# Patient Record
Sex: Male | Born: 1945 | Race: Black or African American | Hispanic: No | Marital: Married | Smoking: Current every day smoker
Health system: Southern US, Community
[De-identification: ages and names within clinical notes are randomized; demographics above are authoritative.]

## PROBLEM LIST (undated history)

## (undated) DIAGNOSIS — M1990 Unspecified osteoarthritis, unspecified site: Secondary | ICD-10-CM

## (undated) DIAGNOSIS — Z720 Tobacco use: Secondary | ICD-10-CM

## (undated) DIAGNOSIS — M109 Gout, unspecified: Secondary | ICD-10-CM

## (undated) DIAGNOSIS — C069 Malignant neoplasm of mouth, unspecified: Secondary | ICD-10-CM

## (undated) DIAGNOSIS — I10 Essential (primary) hypertension: Secondary | ICD-10-CM

## (undated) DIAGNOSIS — I5022 Chronic systolic (congestive) heart failure: Secondary | ICD-10-CM

---
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
## 2000-06-01 ENCOUNTER — Encounter: Payer: Self-pay | Admitting: Emergency Medicine

## 2000-06-01 ENCOUNTER — Inpatient Hospital Stay (HOSPITAL_COMMUNITY): Admission: EM | Admit: 2000-06-01 | Discharge: 2000-06-02 | Payer: Self-pay | Admitting: Emergency Medicine

## 2000-06-02 ENCOUNTER — Encounter: Payer: Self-pay | Admitting: Internal Medicine

## 2000-06-02 ENCOUNTER — Encounter: Payer: Self-pay | Admitting: *Deleted

## 2004-04-04 ENCOUNTER — Encounter: Admission: RE | Admit: 2004-04-04 | Discharge: 2004-04-04 | Payer: Self-pay | Admitting: Allergy and Immunology

## 2014-03-05 HISTORY — PX: FLOOR OF MOUTH BIOPSY: SHX5834

## 2014-03-13 ENCOUNTER — Other Ambulatory Visit (HOSPITAL_COMMUNITY): Payer: Self-pay | Admitting: Otolaryngology

## 2014-03-13 DIAGNOSIS — K1379 Other lesions of oral mucosa: Secondary | ICD-10-CM

## 2014-03-14 ENCOUNTER — Ambulatory Visit (HOSPITAL_COMMUNITY)
Admission: RE | Admit: 2014-03-14 | Discharge: 2014-03-14 | Disposition: A | Discharge status: Home / Self Care | Payer: Medicare Other | Source: Ambulatory Visit | Attending: Otolaryngology | Admitting: Otolaryngology

## 2014-03-14 DIAGNOSIS — R221 Localized swelling, mass and lump, neck: Secondary | ICD-10-CM

## 2014-03-14 DIAGNOSIS — K1379 Other lesions of oral mucosa: Secondary | ICD-10-CM

## 2014-03-14 DIAGNOSIS — K146 Glossodynia: Secondary | ICD-10-CM | POA: Insufficient documentation

## 2014-03-14 DIAGNOSIS — K08109 Complete loss of teeth, unspecified cause, unspecified class: Secondary | ICD-10-CM | POA: Insufficient documentation

## 2014-03-14 DIAGNOSIS — R220 Localized swelling, mass and lump, head: Secondary | ICD-10-CM | POA: Insufficient documentation

## 2014-03-14 DIAGNOSIS — K08409 Partial loss of teeth, unspecified cause, unspecified class: Secondary | ICD-10-CM | POA: Insufficient documentation

## 2014-03-14 LAB — POCT I-STAT CREATININE: Creatinine, Ser: 1.1 mg/dL (ref 0.50–1.35)

## 2014-03-14 MED ORDER — IOHEXOL 300 MG/ML  SOLN
75.0000 mL | Freq: Once | INTRAMUSCULAR | Status: AC | PRN
  Administered 2014-03-14: 75 mL via INTRAVENOUS

## 2014-03-23 ENCOUNTER — Encounter (HOSPITAL_COMMUNITY): Payer: Self-pay | Admitting: Emergency Medicine

## 2014-03-23 ENCOUNTER — Emergency Department (HOSPITAL_COMMUNITY): Payer: Medicare Other

## 2014-03-23 ENCOUNTER — Inpatient Hospital Stay (HOSPITAL_COMMUNITY)
Admission: EM | Admit: 2014-03-23 | Discharge: 2014-03-28 | DRG: 286 | Disposition: A | Discharge status: Home / Self Care | Payer: Medicare Other | Attending: Internal Medicine | Admitting: Internal Medicine

## 2014-03-23 ENCOUNTER — Other Ambulatory Visit (HOSPITAL_COMMUNITY): Payer: Self-pay | Admitting: Otolaryngology

## 2014-03-23 DIAGNOSIS — I5021 Acute systolic (congestive) heart failure: Principal | ICD-10-CM | POA: Diagnosis present

## 2014-03-23 DIAGNOSIS — L988 Other specified disorders of the skin and subcutaneous tissue: Secondary | ICD-10-CM | POA: Diagnosis present

## 2014-03-23 DIAGNOSIS — J441 Chronic obstructive pulmonary disease with (acute) exacerbation: Secondary | ICD-10-CM | POA: Diagnosis present

## 2014-03-23 DIAGNOSIS — I472 Ventricular tachycardia, unspecified: Secondary | ICD-10-CM | POA: Diagnosis present

## 2014-03-23 DIAGNOSIS — F1010 Alcohol abuse, uncomplicated: Secondary | ICD-10-CM | POA: Diagnosis present

## 2014-03-23 DIAGNOSIS — R3129 Other microscopic hematuria: Secondary | ICD-10-CM | POA: Diagnosis not present

## 2014-03-23 DIAGNOSIS — C7800 Secondary malignant neoplasm of unspecified lung: Secondary | ICD-10-CM | POA: Diagnosis present

## 2014-03-23 DIAGNOSIS — R931 Abnormal findings on diagnostic imaging of heart and coronary circulation: Secondary | ICD-10-CM

## 2014-03-23 DIAGNOSIS — D7389 Other diseases of spleen: Secondary | ICD-10-CM | POA: Diagnosis present

## 2014-03-23 DIAGNOSIS — J90 Pleural effusion, not elsewhere classified: Secondary | ICD-10-CM

## 2014-03-23 DIAGNOSIS — Z79899 Other long term (current) drug therapy: Secondary | ICD-10-CM

## 2014-03-23 DIAGNOSIS — E875 Hyperkalemia: Secondary | ICD-10-CM | POA: Diagnosis present

## 2014-03-23 DIAGNOSIS — C029 Malignant neoplasm of tongue, unspecified: Secondary | ICD-10-CM | POA: Diagnosis present

## 2014-03-23 DIAGNOSIS — R918 Other nonspecific abnormal finding of lung field: Secondary | ICD-10-CM | POA: Diagnosis present

## 2014-03-23 DIAGNOSIS — C049 Malignant neoplasm of floor of mouth, unspecified: Secondary | ICD-10-CM

## 2014-03-23 DIAGNOSIS — R0609 Other forms of dyspnea: Secondary | ICD-10-CM

## 2014-03-23 DIAGNOSIS — E876 Hypokalemia: Secondary | ICD-10-CM | POA: Diagnosis present

## 2014-03-23 DIAGNOSIS — I2510 Atherosclerotic heart disease of native coronary artery without angina pectoris: Secondary | ICD-10-CM | POA: Diagnosis present

## 2014-03-23 DIAGNOSIS — R911 Solitary pulmonary nodule: Secondary | ICD-10-CM

## 2014-03-23 DIAGNOSIS — R0602 Shortness of breath: Secondary | ICD-10-CM | POA: Diagnosis present

## 2014-03-23 DIAGNOSIS — N182 Chronic kidney disease, stage 2 (mild): Secondary | ICD-10-CM | POA: Diagnosis present

## 2014-03-23 DIAGNOSIS — J189 Pneumonia, unspecified organism: Secondary | ICD-10-CM | POA: Diagnosis present

## 2014-03-23 DIAGNOSIS — R9431 Abnormal electrocardiogram [ECG] [EKG]: Secondary | ICD-10-CM

## 2014-03-23 DIAGNOSIS — I509 Heart failure, unspecified: Secondary | ICD-10-CM | POA: Diagnosis present

## 2014-03-23 DIAGNOSIS — E871 Hypo-osmolality and hyponatremia: Secondary | ICD-10-CM | POA: Diagnosis present

## 2014-03-23 DIAGNOSIS — E43 Unspecified severe protein-calorie malnutrition: Secondary | ICD-10-CM | POA: Diagnosis present

## 2014-03-23 DIAGNOSIS — Z87891 Personal history of nicotine dependence: Secondary | ICD-10-CM

## 2014-03-23 DIAGNOSIS — R7301 Impaired fasting glucose: Secondary | ICD-10-CM | POA: Diagnosis present

## 2014-03-23 DIAGNOSIS — F17200 Nicotine dependence, unspecified, uncomplicated: Secondary | ICD-10-CM | POA: Diagnosis present

## 2014-03-23 DIAGNOSIS — I129 Hypertensive chronic kidney disease with stage 1 through stage 4 chronic kidney disease, or unspecified chronic kidney disease: Secondary | ICD-10-CM | POA: Diagnosis present

## 2014-03-23 DIAGNOSIS — I428 Other cardiomyopathies: Secondary | ICD-10-CM

## 2014-03-23 DIAGNOSIS — I4729 Other ventricular tachycardia: Secondary | ICD-10-CM | POA: Diagnosis present

## 2014-03-23 HISTORY — DX: Unspecified osteoarthritis, unspecified site: M19.90

## 2014-03-23 HISTORY — DX: Gout, unspecified: M10.9

## 2014-03-23 HISTORY — DX: Malignant neoplasm of mouth, unspecified: C06.9

## 2014-03-23 HISTORY — DX: Essential (primary) hypertension: I10

## 2014-03-23 HISTORY — DX: Tobacco use: Z72.0

## 2014-03-23 HISTORY — DX: Chronic systolic (congestive) heart failure: I50.22

## 2014-03-23 LAB — COMPREHENSIVE METABOLIC PANEL
ALBUMIN: 2.8 g/dL — AB (ref 3.5–5.2)
ALK PHOS: 124 U/L — AB (ref 39–117)
ALT: 21 U/L (ref 0–53)
AST: 33 U/L (ref 0–37)
BUN: 16 mg/dL (ref 6–23)
CHLORIDE: 93 meq/L — AB (ref 96–112)
CO2: 25 mEq/L (ref 19–32)
Calcium: 7.1 mg/dL — ABNORMAL LOW (ref 8.4–10.5)
Creatinine, Ser: 1.01 mg/dL (ref 0.50–1.35)
GFR calc Af Amer: 87 mL/min — ABNORMAL LOW (ref >90)
GFR calc non Af Amer: 75 mL/min — ABNORMAL LOW (ref >90)
Glucose, Bld: 161 mg/dL — ABNORMAL HIGH (ref 70–99)
POTASSIUM: 3 meq/L — AB (ref 3.7–5.3)
Sodium: 137 mEq/L (ref 137–147)
Total Bilirubin: 0.5 mg/dL (ref 0.3–1.2)
Total Protein: 7.3 g/dL (ref 6.0–8.3)

## 2014-03-23 LAB — BASIC METABOLIC PANEL
BUN: 14 mg/dL (ref 6–23)
CHLORIDE: 94 meq/L — AB (ref 96–112)
CO2: 26 mEq/L (ref 19–32)
Calcium: 7.1 mg/dL — ABNORMAL LOW (ref 8.4–10.5)
Creatinine, Ser: 0.89 mg/dL (ref 0.50–1.35)
GFR, EST NON AFRICAN AMERICAN: 87 mL/min — AB (ref >90)
Glucose, Bld: 121 mg/dL — ABNORMAL HIGH (ref 70–99)
POTASSIUM: 2.3 meq/L — AB (ref 3.7–5.3)
SODIUM: 140 meq/L (ref 137–147)

## 2014-03-23 LAB — CBC
HCT: 30.9 % — ABNORMAL LOW (ref 39.0–52.0)
HEMOGLOBIN: 10.6 g/dL — AB (ref 13.0–17.0)
MCH: 30.2 pg (ref 26.0–34.0)
MCHC: 34.3 g/dL (ref 30.0–36.0)
MCV: 88 fL (ref 78.0–100.0)
Platelets: 313 10*3/uL (ref 150–400)
RBC: 3.51 MIL/uL — ABNORMAL LOW (ref 4.22–5.81)
RDW: 14.3 % (ref 11.5–15.5)
WBC: 5.8 10*3/uL (ref 4.0–10.5)

## 2014-03-23 LAB — I-STAT TROPONIN, ED: TROPONIN I, POC: 0 ng/mL (ref 0.00–0.08)

## 2014-03-23 LAB — MAGNESIUM: Magnesium: 1 mg/dL — ABNORMAL LOW (ref 1.5–2.5)

## 2014-03-23 LAB — PRO B NATRIURETIC PEPTIDE: PRO B NATRI PEPTIDE: 6297 pg/mL — AB (ref 0–125)

## 2014-03-23 MED ORDER — ADULT MULTIVITAMIN W/MINERALS CH
1.0000 | ORAL_TABLET | Freq: Every day | ORAL | Status: DC
  Administered 2014-03-23 – 2014-03-27 (×5): 1 via ORAL
  Filled 2014-03-23 (×7): qty 1

## 2014-03-23 MED ORDER — ACETAMINOPHEN 650 MG RE SUPP: 650.0000 mg | Freq: Four times a day (QID) | RECTAL | Status: DC | PRN

## 2014-03-23 MED ORDER — SODIUM CHLORIDE 0.9 % IJ SOLN
3.0000 mL | Freq: Two times a day (BID) | INTRAMUSCULAR | Status: DC
  Administered 2014-03-23 – 2014-03-26 (×6): 3 mL via INTRAVENOUS

## 2014-03-23 MED ORDER — IPRATROPIUM BROMIDE 0.02 % IN SOLN: 0.5000 mg | Freq: Four times a day (QID) | RESPIRATORY_TRACT | Status: DC

## 2014-03-23 MED ORDER — FOLIC ACID 1 MG PO TABS
1.0000 mg | ORAL_TABLET | Freq: Every day | ORAL | Status: DC
Start: 2014-03-23 — End: 2014-03-28
  Administered 2014-03-23 – 2014-03-27 (×5): 1 mg via ORAL
  Filled 2014-03-23 (×7): qty 1

## 2014-03-23 MED ORDER — ACETAMINOPHEN 325 MG PO TABS
650.0000 mg | ORAL_TABLET | Freq: Four times a day (QID) | ORAL | Status: DC | PRN
Start: 2014-03-23 — End: 2014-03-28

## 2014-03-23 MED ORDER — ALBUTEROL SULFATE (2.5 MG/3ML) 0.083% IN NEBU: 2.5000 mg | INHALATION_SOLUTION | Freq: Four times a day (QID) | RESPIRATORY_TRACT | Status: DC

## 2014-03-23 MED ORDER — POTASSIUM CHLORIDE CRYS ER 20 MEQ PO TBCR
40.0000 meq | EXTENDED_RELEASE_TABLET | ORAL | Status: AC
  Administered 2014-03-23 (×2): 40 meq via ORAL
  Filled 2014-03-23 (×2): qty 2

## 2014-03-23 MED ORDER — LORAZEPAM 2 MG/ML IJ SOLN: 1.0000 mg | Freq: Four times a day (QID) | INTRAMUSCULAR | Status: AC | PRN

## 2014-03-23 MED ORDER — POTASSIUM CHLORIDE 10 MEQ/100ML IV SOLN
10.0000 meq | INTRAVENOUS | Status: AC
  Administered 2014-03-23: 10 meq via INTRAVENOUS
  Filled 2014-03-23: qty 100

## 2014-03-23 MED ORDER — IPRATROPIUM-ALBUTEROL 0.5-2.5 (3) MG/3ML IN SOLN
3.0000 mL | Freq: Four times a day (QID) | RESPIRATORY_TRACT | Status: DC
  Administered 2014-03-23: 3 mL via RESPIRATORY_TRACT
  Filled 2014-03-23: qty 3

## 2014-03-23 MED ORDER — ALBUTEROL SULFATE (2.5 MG/3ML) 0.083% IN NEBU
2.5000 mg | INHALATION_SOLUTION | RESPIRATORY_TRACT | Status: DC | PRN
  Administered 2014-03-26 – 2014-03-27 (×2): 2.5 mg via RESPIRATORY_TRACT
  Filled 2014-03-23 (×3): qty 3

## 2014-03-23 MED ORDER — LORAZEPAM 1 MG PO TABS
1.0000 mg | ORAL_TABLET | Freq: Four times a day (QID) | ORAL | Status: AC | PRN
  Administered 2014-03-26: 1 mg via ORAL
  Filled 2014-03-23: qty 1

## 2014-03-23 MED ORDER — DEXTROSE 5 % IV SOLN
2.0000 g | Freq: Once | INTRAVENOUS | Status: AC
  Administered 2014-03-23: 2 g via INTRAVENOUS
  Filled 2014-03-23: qty 2

## 2014-03-23 MED ORDER — POTASSIUM CHLORIDE CRYS ER 20 MEQ PO TBCR
40.0000 meq | EXTENDED_RELEASE_TABLET | Freq: Once | ORAL | Status: AC
  Administered 2014-03-23: 40 meq via ORAL
  Filled 2014-03-23: qty 2

## 2014-03-23 MED ORDER — POTASSIUM CHLORIDE 10 MEQ/100ML IV SOLN
10.0000 meq | Freq: Once | INTRAVENOUS | Status: DC
Start: 2014-03-23 — End: 2014-03-23

## 2014-03-23 MED ORDER — IOHEXOL 350 MG/ML SOLN
100.0000 mL | Freq: Once | INTRAVENOUS | Status: AC | PRN
  Administered 2014-03-23: 100 mL via INTRAVENOUS

## 2014-03-23 MED ORDER — MAGNESIUM SULFATE 4000MG/100ML IJ SOLN
4.0000 g | Freq: Once | INTRAMUSCULAR | Status: AC
  Administered 2014-03-23: 4 g via INTRAVENOUS
  Filled 2014-03-23: qty 100

## 2014-03-23 MED ORDER — ENOXAPARIN SODIUM 40 MG/0.4ML ~~LOC~~ SOLN
40.0000 mg | SUBCUTANEOUS | Status: DC
  Administered 2014-03-23 – 2014-03-26 (×4): 40 mg via SUBCUTANEOUS
  Filled 2014-03-23 (×6): qty 0.4

## 2014-03-23 MED ORDER — DEXTROSE 5 % IV SOLN
500.0000 mg | Freq: Once | INTRAVENOUS | Status: AC
  Administered 2014-03-23: 500 mg via INTRAVENOUS
  Filled 2014-03-23 (×2): qty 500

## 2014-03-23 MED ORDER — VITAMIN B-1 100 MG PO TABS
100.0000 mg | ORAL_TABLET | Freq: Every day | ORAL | Status: DC
  Administered 2014-03-23 – 2014-03-27 (×5): 100 mg via ORAL
  Filled 2014-03-23 (×7): qty 1

## 2014-03-23 NOTE — ED Notes (Signed)
Patient here for sob, onset 2 months ago, was here for same, pt with hx of mouth cancer, denies pain at this time.

## 2014-03-23 NOTE — ED Notes (Signed)
Sob  Started on Sunday dx w/ cancer lung 1 month ago

## 2014-03-23 NOTE — ED Notes (Signed)
Critical Potassium 2.3 reported to [employer]

## 2014-03-23 NOTE — ED Provider Notes (Signed)
CSN: [id number]     Arrival date & time 03/23/14  1021 History   First MD Initiated Contact with Patient 03/23/14 1035     Chief Complaint  Patient presents with  . Shortness of Breath     (Consider location/radiation/quality/duration/timing/severity/associated sxs/prior Treatment) HPI 68 year old male presents with one week of shortness of breath. Shortness of breath is on exertion. When he rests he feels better and his difficulty breathing is gone. Has had a cough, mostly nonproductive. No chest pain. He was recently diagnosed with throat cancer and has been referred to the [facility] in [place]. He denies any fevers. He's had some mild leg swelling for about one month bilaterally. Denies any orthopnea or paroxysmal nocturnal dyspnea. No history of COPD. He is a former smoker. He's been hearing some wheezing intermittently.  Past Medical History  Diagnosis Date  . Cancer    History reviewed. No pertinent past surgical history. No family history on file. History  Substance Use Topics  . Smoking status: Former Research scientist (life sciences)  . Smokeless tobacco: Not on file  . Alcohol Use: Not on file    Review of Systems  Constitutional: Negative for fever.  Respiratory: Positive for cough and shortness of breath. Negative for wheezing.   Cardiovascular: Positive for leg swelling. Negative for chest pain.  Gastrointestinal: Negative for vomiting and abdominal pain.  All other systems reviewed and are negative.     Allergies  Review of patient's allergies indicates no known allergies.  Home Medications   Prior to Admission medications   Not on File   BP 144/89  Pulse 101  Resp 17  Wt 155 lb (70.308 kg)  SpO2 92% Physical Exam  Nursing note and vitals reviewed. Constitutional: He is oriented to person, place, and time. He appears well-developed and well-nourished.  HENT:  Head: Normocephalic and atraumatic.  Right Ear: External ear normal.  Left Ear: External ear normal.  Nose:  Nose normal.  Eyes: Right eye exhibits no discharge. Left eye exhibits no discharge.  Neck: Neck supple.  Cardiovascular: Normal rate, regular rhythm, normal heart sounds and intact distal pulses.   HR around 100  Pulmonary/Chest: Effort normal. He has no wheezes. He has rales in the right lower field and the left lower field.  Abdominal: Soft. He exhibits no distension. There is no tenderness.  Musculoskeletal: He exhibits edema (trace pedal edema bilaterally).  Neurological: He is alert and oriented to person, place, and time.  Skin: Skin is warm and dry.    ED Course  Procedures (including critical care time) Labs Review Labs Reviewed  BASIC METABOLIC PANEL - Abnormal; Notable for the following:    Potassium 2.3 (*)    Chloride 94 (*)    Glucose, Bld 121 (*)    Calcium 7.1 (*)    GFR calc non Af Amer 87 (*)    All other components within normal limits  CBC - Abnormal; Notable for the following:    RBC 3.51 (*)    Hemoglobin 10.6 (*)    HCT 30.9 (*)    All other components within normal limits  PRO B NATRIURETIC PEPTIDE - Abnormal; Notable for the following:    Pro B Natriuretic peptide (BNP) 6297.0 (*)    All other components within normal limits  CULTURE, BLOOD (ROUTINE X 2)  CULTURE, BLOOD (ROUTINE X 2)  I-STAT TROPOININ, ED    Imaging Review Dg Chest 2 View (if Patient Has Fever And/or Copd)  03/23/2014   CLINICAL DATA:  Shortness  of breath  EXAM: CHEST  2 VIEW  COMPARISON:  PA and lateral chest x-ray of April 04, 2004.  FINDINGS: There are confluent masslike parenchymal densities in the right and left mid lung. The cardiac silhouette is normal in size. The pulmonary vascularity is not engorged. Small amounts of pleural fluid blunt the costophrenic angles laterally and thickened the fissures. The bony thorax is unremarkable.  IMPRESSION: Abnormal bilateral pulmonary parenchymal densities are suspicious for masses which may be infectious or neoplastic. Chest CT scanning now  is recommended.   Electronically Signed   By: David  Martinique   On: 03/23/2014 10:53   Ct Angio Chest W/cm &/or Wo Cm  03/23/2014   CLINICAL DATA:  Shortness of breath, possible lung cancer - evaluate for PE  EXAM: CT ANGIOGRAPHY CHEST WITH CONTRAST  TECHNIQUE: Multidetector CT imaging of the chest was performed using the standard protocol during bolus administration of intravenous contrast. Multiplanar CT image reconstructions and MIPs were obtained to evaluate the vascular anatomy.  CONTRAST:  181mL OMNIPAQUE IOHEXOL 350 MG/ML SOLN  COMPARISON:  Chest x-ray obtained earlier today  FINDINGS: Mediastinum: Unremarkable CT appearance of the thyroid gland. Borderline mediastinal adenopathy. A prevascular lymph node measures 9 mm in short axis (image 35, series 4). Slightly enlarged right paratracheal node measures 1.3 cm in short axis (image 26, series 4). Unremarkable appearance of the esophagus.  Heart/Vascular: Adequate opacification of the pulmonary arteries to the proximal subsegmental level. No evidence of central filling defect to suggest acute pulmonary embolus. Atherosclerotic calcifications present within the coronary arteries. Borderline cardiomegaly with left ventricular dilatation. Reflux of contrast material into the hepatic veins noted incidentally.  Lungs/Pleura: Small right pleural effusion versus pleural thickening. There is thickening calcified pleural plaque medially adjacent to the right lower lobe as well as a mild pleural calcification inferiorly overlying the hepatic dome. Extensive nodular low-attenuation implants present throughout the subpleural space, particularly within the pulmonary fissures. Exact measurement of the abnormalities is a difficult given that many of the in nodules are confluent and connect through the subpleural space. In the superior aspect of the right major fissure, an index dumbbell-shaped lesion measures 6.1 x 3.6 cm. On the left, in the major fissure a second  dumbbell-shaped lesion measures 5.9 by 3.0 cm (image 58, series 4). Moderate centrilobular emphysema is present throughout the upper lungs. Respiratory motion limits evaluation for small pulmonary nodules. There is a single 1.6 x 1.3 cm nodular opacity in the left lower lobe (image 70, series 6). The margins of this nodule are somewhat hazy and ill-defined. It is unclear if this is secondary to ground-glass attenuation as can be seen in the setting of infection/ inflammation, or due to superimpose respiratory motion. There is diffuse bronchial wall thickening throughout the lungs.  Bones/Soft Tissues: No acute fracture or aggressive appearing lytic or blastic osseous lesion. No acute fracture or aggressive appearing lytic or blastic osseous lesion. Multilevel degenerative spurring throughout the thoracic spine.  Upper Abdomen: Punctate calcifications present throughout the spleen and liver suggesting old granulomatous disease. The images are mildly degraded by motion artifact. A 9 mm low-attenuation lesion in the upper pole of the right kidney is incompletely characterized but statistically likely a benign cyst.  Review of the MIP images confirms the above findings.  IMPRESSION: 1. Negative for acute pulmonary embolus. 2. The numerous densities seen on the prior chest x-ray correspond with subpleural low-attenuation (water) masses scattered throughout the pleural surfaces of the pulmonary fissures indicative of a process. Given the  overall distribution and borderline mediastinal lymphadenopathy, an atypical appearance of sarcoidosis is favored. However, the imaging appearance is not definitive in this example. Additional differential considerations given the very low attenuation of the masses include multi loculated pleural fluid, multiple bilateral pleural metastases from an unknown primary malignancy (possibly a mucinous adenocarcinoma given the low-attenuation), and less likely a primary pleural malignancy such  as mesothelioma. Mesothelioma is considered less likely given the bilaterality of the masses and the unilateral distribution of calcified pleural plaques. 3. There is a solitary somewhat ill-defined and hazy 1.6 x 1.3 cm nodular opacity in the left lower lobe which may represent a focus of active infection/inflammation or a true pulmonary nodule. Overall, I would recommend further evaluation with PET-CT to evaluate for hypermetabolic activity in both the pleural lesions, and the left lower lobe nodule. If all lesions are hypermetabolic, than CT-guided biopsy would be warranted. If the left lower lobe pulmonary nodule is intensely hypermetabolic, CT-guided biopsy would be warranted. If, however the left lower lobe pulmonary nodule is only mildly hypermetabolic then both repeat imaging following an appropriate course of antimicrobial therapy or CT-guided biopsy would be reasonable approaches. 4. Nonspecific mediastinal adenopathy may be reactive / granulomatous or metastatic. 5. 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 6. Calcified pleural plaques in the medial and inferior aspect of the right hemi thorax. Given the unilaterality, this likely represents the sequelae of a prior infectious/inflammatory process or sequelae of remote hemothorax. Asbestos related pleural disease is less likely. 7. Cardiomegaly with left ventricular dilatation. 8. Atherosclerosis including coronary artery disease. 9. Sequelae of old granulomatous disease involving the spleen and liver.   Electronically Signed   By: Jacqulynn Cadet M.D.   On: 03/23/2014 13:21     EKG Interpretation   Date/Time:  Friday March 23 2014 10:26:09 EDT Ventricular Rate:  102 PR Interval:  140 QRS Duration: 100 QT Interval:  406 QTC Calculation: 529 R Axis:   34 Text Interpretation:  Sinus tachycardia Possible Left atrial enlargement  Left ventricular hypertrophy ST \\T \ T wave abnormality, consider lateral  ischemia  Abnormal ECG No old tracing to compare Confirmed by Trezevant  MD,  [place] ([id number]) on 03/23/2014 10:35:48 AM      MDM   Final diagnoses:  Community acquired pneumonia  Dyspnea on exertion  Abnormal EKG  Hypokalemia    Patient's CT scan shows likely sarcoidosis but also an area of mass versus pneumonia. Given his acute symptoms over last week, he'll need treatment for pneumonia. He has EKG changes, but no old EKG to compare to. This concerning for either heart etiology or could be related to his hyperkalemia. He has no PCP at this time, will be admitted to the internal medicine teaching service. Will need echocardiogram given his elevated BNP and minor leg swelling. He is at this time stable, will be admitted on telemetry.    Ephraim Hamburger, MD 03/23/14 1550

## 2014-03-23 NOTE — ED Notes (Signed)
MD at bedside. 

## 2014-03-23 NOTE — Progress Notes (Signed)
Pt states he drinks "3-4 shots" daily, RN paged MD to make aware.

## 2014-03-23 NOTE — Progress Notes (Signed)
NURSING PROGRESS NOTE  [facility] [id number] Admission Data: 03/23/2014 3:39 PM Attending Provider: Madilyn Fireman, MD PCP:No PCP Per Patient Code Status: Full  BOYD BUFFALO is a 68 y.o. male patient admitted from ED:  -No acute distress noted.  -No complaints of shortness of breath.  -No complaints of chest pain.   Cardiac Monitoring: Box # [id number] in place. Cardiac monitor yields:normal sinus rhythm.  Blood pressure 152/88, pulse 108, temperature 97.5 F (36.4 C), temperature source Oral, resp. rate 20, height 6' (1.829 m), weight 70.3 kg (154 lb 15.7 oz), SpO2 94.00%.   IV Fluids:  IV in place, occlusive dsg intact without redness, IV cath forearm right, condition patent and no redness normal saline @10 .   Allergies:  Review of patient's allergies indicates no known allergies.  Past Medical History:   has a past medical history of Cancer.  Past Surgical History:   has no past surgical history on file.  Social History:   reports that he has quit smoking. He does not have any smokeless tobacco history on file.  Skin: warm dry intact   Patient/Family orientated to room. Information packet given to patient/family. Admission inpatient armband information verified with patient/family to include name and date of birth and placed on patient arm. Side rails up x 2, fall assessment and education completed with patient/family. Patient/family able to verbalize understanding of risk associated with falls and verbalized understanding to call for assistance before getting out of bed. Call light within reach. Patient/family able to voice and demonstrate understanding of unit orientation instructions.    Will continue to evaluate and treat per MD orders.

## 2014-03-23 NOTE — H&P (Signed)
Date: 03/23/2014               Patient Name:  Julian Zimmerman MRN: [id number]  DOB: 1946-07-05 Age / Sex: 68 y.o., male   PCP: No Pcp Per Patient         Medical Service: Internal Medicine Teaching Service         Attending Physician: Dr. Madilyn Fireman, MD    First Contact: Dr. Joni Reining Pager: [phone]  Second Contact: Dr. Randell Loop Pager: [phone]       After Hours (After 5p/  First Contact Pager: [phone]  weekends / holidays): Second Contact Pager: [phone]   Chief Complaint: SOB  History of Present Illness: Julian Zimmerman is a 68 yo man with recently diagnosed SCC of the throat (being worked up by Dr. Constance Holster (ENT)).  He reports this is his second time in a hospital and does not have a regular doctor or seek any medical care (he was referred to Dr Constance Holster from a dentist).  He presents to [facility] today with CC of progressive SOB on exertion progressing over the past 2 weeks.  He notes associated symptoms of productive cough (yellow sputum) x2 weeks.  He denies fevers/ chills, chest pain, nausea/vomiting, dysuria, abdominal pain, constipation, diarrhea.  In the ED a CTA of the chest was negative for pulmonary embolism but did show multiple areas of low attenuation masses. In the ED initial labs revealed a potassium of 2.3 mEq/L and initial EKG showed some T wave abnormalities.  He was given 40 mEq Kdur x2 PO in ED and 2 runs of 10mg  IV potassium and IMTS was asked to admit for further workup and treatment of SOB and hypokalemia.   Meds: Current Facility-Administered Medications  Medication Dose Route Frequency Dionne Rossa Last Rate Last Dose  . azithromycin (ZITHROMAX) 500 mg in dextrose 5 % 250 mL IVPB  500 mg Intravenous Once Ephraim Hamburger, MD      . potassium chloride 10 mEq in 100 mL IVPB  10 mEq Intravenous Q1 Hr x 2 Ephraim Hamburger, MD 100 mL/hr at 03/23/14 1449 10 mEq at 03/23/14 1449   No prescriptions prior to admission     Allergies: Allergies as of 03/23/2014    . (No Known Allergies)   Past Medical History  Diagnosis Date  . Gout     "right leg" (03/23/2014)  . Arthritis     "right leg" (03/23/2014)  . Oral cancer    Past Surgical History  Procedure Laterality Date  . Tonsillectomy and adenoidectomy  1955  . Floor of mouth biopsy  03/2014    "found cancer"   Family History  Problem Relation Age of Onset  . Diabetes Mother   . Diabetes Father    History   Social History  . Marital Status: Married    Spouse Name: N/A    Number of Children: N/A  . Years of Education: N/A   Occupational History  . 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 Archivist    "making uranium pelets"   .  [place] History Main Topics  . Smoking status: Former Smoker -- 1.00 packs/day for 52 years    Types: Cigarettes    Quit date: 03/05/2014  . Smokeless tobacco: Never Used  . Alcohol Use: 16.8 oz/week    28 Shots of liquor per week     Comment: 03/23/2014 "4, 1oz shots/day"  . Drug Use: No  . Sexual Activity: Not  Currently   Other Topics Concern  . Not on file   Social History Narrative  . No narrative on file    Review of Systems: Review of Systems  Constitutional: Negative for fever, chills, weight loss and malaise/fatigue.  Eyes: Negative for blurred vision.  Respiratory: Positive for cough, sputum production and shortness of breath.   Cardiovascular: Positive for leg swelling (chronic). Negative for chest pain.  Gastrointestinal: Negative for heartburn, nausea, vomiting, abdominal pain, diarrhea, constipation and blood in stool.  Genitourinary: Negative for dysuria, urgency, frequency and hematuria.  Musculoskeletal: Negative for back pain and myalgias.  Skin: Negative for rash.  Neurological: Negative for dizziness, weakness and headaches.  Psychiatric/Behavioral: Negative for substance abuse.     Physical Exam: Blood pressure 152/88, pulse 108, temperature 97.5 F (36.4 C), temperature source Oral,  resp. rate 20, height 6' (1.829 m), weight 154 lb 15.7 oz (70.3 kg), SpO2 94.00%. Physical Exam  Nursing note and vitals reviewed. Constitutional: He is oriented to person, place, and time. No distress.  HENT:  Head: Normocephalic and atraumatic.  Mouth/Throat:    Eyes: Conjunctivae and EOM are normal.  Cardiovascular: Regular rhythm and normal heart sounds.  Tachycardia present.   Pulmonary/Chest: Effort normal. No respiratory distress. He has wheezes (mild expiratory). He has rales (bilateral lower lobes).  Abdominal: Soft. Bowel sounds are normal. He exhibits no distension and no mass. There is no tenderness. There is no rebound.  Musculoskeletal: He exhibits edema (1-2+ to knee).  Neurological: He is alert and oriented to person, place, and time. No cranial nerve deficit.  Skin: Skin is warm and dry.  Psychiatric: Affect normal.     Lab results: Basic Metabolic Panel:  Recent Labs  03/23/14 1045  NA 140  K 2.3*  CL 94*  CO2 26  GLUCOSE 121*  BUN 14  CREATININE 0.89  CALCIUM 7.1*   CBC:  Recent Labs  03/23/14 1045  WBC 5.8  HGB 10.6*  HCT 30.9*  MCV 88.0  PLT 313   BNP:  Recent Labs  03/23/14 1045  PROBNP 6297.0*   Imaging results:  Dg Chest 2 View (if Patient Has Fever And/or Copd)  03/23/2014   CLINICAL DATA:  Shortness of breath  EXAM: CHEST  2 VIEW  COMPARISON:  PA and lateral chest x-ray of April 04, 2004.  FINDINGS: There are confluent masslike parenchymal densities in the right and left mid lung. The cardiac silhouette is normal in size. The pulmonary vascularity is not engorged. Small amounts of pleural fluid blunt the costophrenic angles laterally and thickened the fissures. The bony thorax is unremarkable.  IMPRESSION: Abnormal bilateral pulmonary parenchymal densities are suspicious for masses which may be infectious or neoplastic. Chest CT scanning now is recommended.   Electronically Signed   By: David  Martinique   On: 03/23/2014 10:53   Ct Angio  Chest W/cm &/or Wo Cm  03/23/2014   CLINICAL DATA:  Shortness of breath, possible lung cancer - evaluate for PE  EXAM: CT ANGIOGRAPHY CHEST WITH CONTRAST  TECHNIQUE: Multidetector CT imaging of the chest was performed using the standard protocol during bolus administration of intravenous contrast. Multiplanar CT image reconstructions and MIPs were obtained to evaluate the vascular anatomy.  CONTRAST:  17mL OMNIPAQUE IOHEXOL 350 MG/ML SOLN  COMPARISON:  Chest x-ray obtained earlier today  FINDINGS: Mediastinum: Unremarkable CT appearance of the thyroid gland. Borderline mediastinal adenopathy. A prevascular lymph node measures 9 mm in short axis (image 35, series 4). Slightly enlarged right paratracheal node  measures 1.3 cm in short axis (image 26, series 4). Unremarkable appearance of the esophagus.  Heart/Vascular: Adequate opacification of the pulmonary arteries to the proximal subsegmental level. No evidence of central filling defect to suggest acute pulmonary embolus. Atherosclerotic calcifications present within the coronary arteries. Borderline cardiomegaly with left ventricular dilatation. Reflux of contrast material into the hepatic veins noted incidentally.  Lungs/Pleura: Small right pleural effusion versus pleural thickening. There is thickening calcified pleural plaque medially adjacent to the right lower lobe as well as a mild pleural calcification inferiorly overlying the hepatic dome. Extensive nodular low-attenuation implants present throughout the subpleural space, particularly within the pulmonary fissures. Exact measurement of the abnormalities is a difficult given that many of the in nodules are confluent and connect through the subpleural space. In the superior aspect of the right major fissure, an index dumbbell-shaped lesion measures 6.1 x 3.6 cm. On the left, in the major fissure a second dumbbell-shaped lesion measures 5.9 by 3.0 cm (image 58, series 4). Moderate centrilobular emphysema is  present throughout the upper lungs. Respiratory motion limits evaluation for small pulmonary nodules. There is a single 1.6 x 1.3 cm nodular opacity in the left lower lobe (image 70, series 6). The margins of this nodule are somewhat hazy and ill-defined. It is unclear if this is secondary to ground-glass attenuation as can be seen in the setting of infection/ inflammation, or due to superimpose respiratory motion. There is diffuse bronchial wall thickening throughout the lungs.  Bones/Soft Tissues: No acute fracture or aggressive appearing lytic or blastic osseous lesion. No acute fracture or aggressive appearing lytic or blastic osseous lesion. Multilevel degenerative spurring throughout the thoracic spine.  Upper Abdomen: Punctate calcifications present throughout the spleen and liver suggesting old granulomatous disease. The images are mildly degraded by motion artifact. A 9 mm low-attenuation lesion in the upper pole of the right kidney is incompletely characterized but statistically likely a benign cyst.  Review of the MIP images confirms the above findings.  IMPRESSION: 1. Negative for acute pulmonary embolus. 2. The numerous densities seen on the prior chest x-ray correspond with subpleural low-attenuation (water) masses scattered throughout the pleural surfaces of the pulmonary fissures indicative of a process. Given the overall distribution and borderline mediastinal lymphadenopathy, an atypical appearance of sarcoidosis is favored. However, the imaging appearance is not definitive in this example. Additional differential considerations given the very low attenuation of the masses include multi loculated pleural fluid, multiple bilateral pleural metastases from an unknown primary malignancy (possibly a mucinous adenocarcinoma given the low-attenuation), and less likely a primary pleural malignancy such as mesothelioma. Mesothelioma is considered less likely given the bilaterality of the masses and the  unilateral distribution of calcified pleural plaques. 3. There is a solitary somewhat ill-defined and hazy 1.6 x 1.3 cm nodular opacity in the left lower lobe which may represent a focus of active infection/inflammation or a true pulmonary nodule. Overall, I would recommend further evaluation with PET-CT to evaluate for hypermetabolic activity in both the pleural lesions, and the left lower lobe nodule. If all lesions are hypermetabolic, than CT-guided biopsy would be warranted. If the left lower lobe pulmonary nodule is intensely hypermetabolic, CT-guided biopsy would be warranted. If, however the left lower lobe pulmonary nodule is only mildly hypermetabolic then both repeat imaging following an appropriate course of antimicrobial therapy or CT-guided biopsy would be reasonable approaches. 4. Nonspecific mediastinal adenopathy may be reactive / granulomatous or metastatic. 5. 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 6.  Calcified pleural plaques in the medial and inferior aspect of the right hemi thorax. Given the unilaterality, this likely represents the sequelae of a prior infectious/inflammatory process or sequelae of remote hemothorax. Asbestos related pleural disease is less likely. 7. Cardiomegaly with left ventricular dilatation. 8. Atherosclerosis including coronary artery disease. 9. Sequelae of old granulomatous disease involving the spleen and liver.   Electronically Signed   By: Jacqulynn Cadet M.D.   On: 03/23/2014 13:21    Other results: EKG: Sinus tachycardia, LVH, non specific ST changes, no previous available for comparision  Assessment & Plan by Problem:   Hypokalemia - K+ of 2.3 on presentation.  Patient does have some T wave abnormalities on EKG.  Given 42mEq x2 + 2 runs of 76mEq IV.  No nonanion gap acidosis to suggest RTA, no known usage of diuretics or other medication induced causes.  - Check CMP - Check Mag - Continue to replace as necessary     SOB (shortness of breath) on exertion - DDx includes CAP, COPD, CHF, metastatic cancer to lung, sarcodosis - Patient does have a non productive cough, but otherwise has no symptoms of PNA (no fever, leukocytosis) he was started on empiric CAP coverage with Ceftriaxone and Azithromycin. Blood cultures have been obtained.  I feel that it is low likelihood to be a CAP and will not continue abx at this time, additionally will try to avoid azithromycin and Levaquin due to patient's prolonged QT. - Patient does have evidence of COPD on CT, and has active wheezing, his symptoms may represent a mild COPD exacerbation, will treat with Duoneb q6prn and supplemental O2 at this time.   - Patient does have an elevated proBNP and evidence of possible pulmonary edema on CXR, rales in lower lobes and evidence of lower extremity edema.  Will hold off on lasix at this time as patient his hypokalemic and SOB more likely to have mild COPD exacerbation. - CT is suggestive of sarcoidosis, will check an ACE level to aid in diagnosis. - Will maintain O2 sat >92% with PRN O2.   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 Constance Holster, pathology from [facility] shows [facility] with possible metastasis.  CTA is concerning for possible metastatic disease.  Recommend f/u PET-CT, this may be done outpatient or if needed more urgently patient may need to be transferred to [facility]    COPD (chronic obstructive pulmonary disease) - Seen on CTA. Patient has long smoking history - May be contributory to SOB - Duoneb q4prn - Patient received empiric Abx for CAP in ED. Will consider changing to doxycycline for mild COPD exacerbation in AM.    CAD (coronary artery disease) - Coronary arthrosclerosis seen on chest CT.  No acute intervention at this time.  DVT PPx: Lovenox Diet: Regular Code: Full Dispo: Disposition is deferred at this time, awaiting improvement of current medical problems. Anticipated discharge in approximately 2  day(s).   The patient does have a current PCP (No Pcp Per Patient) and does need an [facility] hospital follow-up appointment after discharge.  The patient does not have transportation limitations that hinder transportation to clinic appointments.  Signed: Joni Reining, DO 03/23/2014, 3:31 PM

## 2014-03-23 NOTE — Progress Notes (Signed)
RN called for report.  

## 2014-03-23 NOTE — ED Notes (Signed)
Patient transported to X-ray 

## 2014-03-23 NOTE — ED Notes (Signed)
Correction to triage note, below tongue.

## 2014-03-23 NOTE — ED Notes (Signed)
Returned from [employer], pt also reports swelling in legs.

## 2014-03-24 DIAGNOSIS — J449 Chronic obstructive pulmonary disease, unspecified: Secondary | ICD-10-CM

## 2014-03-24 DIAGNOSIS — C049 Malignant neoplasm of floor of mouth, unspecified: Secondary | ICD-10-CM

## 2014-03-24 DIAGNOSIS — E43 Unspecified severe protein-calorie malnutrition: Secondary | ICD-10-CM | POA: Diagnosis present

## 2014-03-24 DIAGNOSIS — F1010 Alcohol abuse, uncomplicated: Secondary | ICD-10-CM | POA: Diagnosis present

## 2014-03-24 DIAGNOSIS — R0602 Shortness of breath: Secondary | ICD-10-CM

## 2014-03-24 DIAGNOSIS — E876 Hypokalemia: Secondary | ICD-10-CM

## 2014-03-24 LAB — BASIC METABOLIC PANEL
BUN: 16 mg/dL (ref 6–23)
BUN: 23 mg/dL (ref 6–23)
CHLORIDE: 94 meq/L — AB (ref 96–112)
CHLORIDE: 92 meq/L — AB (ref 96–112)
CO2: 24 meq/L (ref 19–32)
CO2: 22 meq/L (ref 19–32)
CREATININE: 1.11 mg/dL (ref 0.50–1.35)
Calcium: 7.2 mg/dL — ABNORMAL LOW (ref 8.4–10.5)
Calcium: 7.5 mg/dL — ABNORMAL LOW (ref 8.4–10.5)
Creatinine, Ser: 1.29 mg/dL (ref 0.50–1.35)
GFR calc Af Amer: 77 mL/min — ABNORMAL LOW (ref >90)
GFR calc Af Amer: 65 mL/min — ABNORMAL LOW (ref >90)
GFR calc non Af Amer: 67 mL/min — ABNORMAL LOW (ref >90)
GFR, EST NON AFRICAN AMERICAN: 56 mL/min — AB (ref >90)
GLUCOSE: 324 mg/dL — AB (ref 70–99)
Glucose, Bld: 124 mg/dL — ABNORMAL HIGH (ref 70–99)
Potassium: 3.1 mEq/L — ABNORMAL LOW (ref 3.7–5.3)
Potassium: 3.6 mEq/L — ABNORMAL LOW (ref 3.7–5.3)
Sodium: 138 mEq/L (ref 137–147)
Sodium: 134 mEq/L — ABNORMAL LOW (ref 137–147)

## 2014-03-24 LAB — GLUCOSE, CAPILLARY
Glucose-Capillary: 126 mg/dL — ABNORMAL HIGH (ref 70–99)
Glucose-Capillary: 381 mg/dL — ABNORMAL HIGH (ref 70–99)

## 2014-03-24 LAB — ANGIOTENSIN CONVERTING ENZYME: Angiotensin-Converting Enzyme: 27 U/L (ref 8–52)

## 2014-03-24 LAB — MAGNESIUM: Magnesium: 1.8 mg/dL (ref 1.5–2.5)

## 2014-03-24 LAB — HIV ANTIBODY (ROUTINE TESTING W REFLEX): HIV: NONREACTIVE

## 2014-03-24 MED ORDER — BOOST / RESOURCE BREEZE PO LIQD
1.0000 | Freq: Three times a day (TID) | ORAL | Status: DC
  Administered 2014-03-24 (×3): 1 via ORAL

## 2014-03-24 MED ORDER — INSULIN ASPART 100 UNIT/ML ~~LOC~~ SOLN
0.0000 [IU] | Freq: Three times a day (TID) | SUBCUTANEOUS | Status: DC
  Administered 2014-03-25: 1 [IU] via SUBCUTANEOUS
  Administered 2014-03-25: 3 [IU] via SUBCUTANEOUS
  Administered 2014-03-25 – 2014-03-26 (×2): 2 [IU] via SUBCUTANEOUS
  Administered 2014-03-26 – 2014-03-27 (×2): 3 [IU] via SUBCUTANEOUS

## 2014-03-24 MED ORDER — METHYLPREDNISOLONE SODIUM SUCC 125 MG IJ SOLR
125.0000 mg | Freq: Once | INTRAMUSCULAR | Status: AC
  Administered 2014-03-24: 125 mg via INTRAVENOUS
  Filled 2014-03-24: qty 2

## 2014-03-24 MED ORDER — MAGNESIUM SULFATE 40 MG/ML IJ SOLN
2.0000 g | Freq: Once | INTRAMUSCULAR | Status: AC
  Administered 2014-03-24: 2 g via INTRAVENOUS
  Filled 2014-03-24: qty 50

## 2014-03-24 MED ORDER — POTASSIUM CHLORIDE CRYS ER 20 MEQ PO TBCR
40.0000 meq | EXTENDED_RELEASE_TABLET | Freq: Once | ORAL | Status: AC
  Administered 2014-03-24: 40 meq via ORAL
  Filled 2014-03-24: qty 2

## 2014-03-24 MED ORDER — IPRATROPIUM-ALBUTEROL 0.5-2.5 (3) MG/3ML IN SOLN
3.0000 mL | Freq: Four times a day (QID) | RESPIRATORY_TRACT | Status: DC
  Administered 2014-03-24 – 2014-03-25 (×5): 3 mL via RESPIRATORY_TRACT
  Filled 2014-03-24 (×5): qty 3

## 2014-03-24 MED ORDER — INSULIN ASPART 100 UNIT/ML ~~LOC~~ SOLN
0.0000 [IU] | Freq: Every day | SUBCUTANEOUS | Status: DC
  Administered 2014-03-24: 5 [IU] via SUBCUTANEOUS

## 2014-03-24 MED ORDER — IPRATROPIUM-ALBUTEROL 0.5-2.5 (3) MG/3ML IN SOLN
3.0000 mL | RESPIRATORY_TRACT | Status: DC
Start: 2014-03-24 — End: 2014-03-24
  Administered 2014-03-24: 3 mL via RESPIRATORY_TRACT
  Filled 2014-03-24: qty 3

## 2014-03-24 MED ORDER — DOXYCYCLINE HYCLATE 100 MG PO TABS
100.0000 mg | ORAL_TABLET | Freq: Two times a day (BID) | ORAL | Status: AC
  Administered 2014-03-24 – 2014-03-27 (×8): 100 mg via ORAL
  Filled 2014-03-24 (×8): qty 1

## 2014-03-24 MED ORDER — POTASSIUM CHLORIDE CRYS ER 20 MEQ PO TBCR
40.0000 meq | EXTENDED_RELEASE_TABLET | ORAL | Status: AC
  Administered 2014-03-24 (×2): 40 meq via ORAL
  Filled 2014-03-24: qty 2

## 2014-03-24 NOTE — Progress Notes (Signed)
INITIAL NUTRITION ASSESSMENT  DOCUMENTATION CODES Per approved criteria  -Severe malnutrition in the context of chronic illness   INTERVENTION: Resource Breeze po TID, each supplement provides 250 kcal and 9 grams of protein  Encouraged high calorie and high protein foods (reviewed sources) in preparation for surgery.   NUTRITION DIAGNOSIS: Malnutrition related to chronic illness (cancer) as evidenced by 11% weight loss x 1 month and severe fat depletion.   Goal: Pt to meet >/= 90% of their estimated nutrition needs   Monitor:  PO intake, supplement acceptance, weight trend, labs  Reason for Assessment: Pt identified as at nutrition risk on the Malnutrition Screen Tool  68 y.o. male  Admitting Dx: <principal problem not specified>  ASSESSMENT: Pt recently dx with SCC of the throat with possible metastasis, work-up in progress. Pt admitted for SOB and hypokalemia.  Per pt he has lost a lot of weight over the last month. Approximately 11% in one month due to pain when chewing. Pt states that he has learned to manage this and his intake has improved. Pt has always been a good eater, eats 3 meals per day. Pt was drinking (4 shots per day) and smoking (1ppd) prior to his cancer dx, but has since stopped. Pt only ate 50% of his Breakfast this am.  Pt plans to have surgery later this month for cancer at [facility].  Pt does not want ensure but is willing to try Resource Breeze.   Nutrition Focused Physical Exam:  Subcutaneous Fat:  Orbital Region: WNL Upper Arm Region: severe depletion  Thoracic and Lumbar Region: severe depletion   Muscle:  Temple Region: WNL Clavicle Bone Region: mild depletion  Clavicle and Acromion Bone Region: mild depletion Scapular Bone Region: mild depletion Dorsal Hand: mild depletion Patellar Region: WNL Anterior Thigh Region: WNL Posterior Calf Region: WNL  Edema: not present   Height: Ht Readings from Last 1 Encounters:  03/23/14 6' (1.829 m)     Weight: Wt Readings from Last 1 Encounters:  03/23/14 154 lb 15.7 oz (70.3 kg)    Ideal Body Weight: 80.9 kg   % Ideal Body Weight: 87%  Wt Readings from Last 10 Encounters:  03/23/14 154 lb 15.7 oz (70.3 kg)    Usual Body Weight: 175 lb  % Usual Body Weight: 89%  BMI:  Body mass index is 21.01 kg/(m^2).  Estimated Nutritional Needs: Kcal: 2000-2200 Protein: 90-110 grams Fluid: > 2 l/day  Skin: intact  Diet Order: General  EDUCATION NEEDS: -No education needs identified at this time  No intake or output data in the 24 hours ending 03/24/14 0919  Last BM: 6/19   Labs:   Recent Labs Lab 03/23/14 1045 03/23/14 1810 03/24/14 0451  NA 140 137 138  K 2.3* 3.0* 3.1*  CL 94* 93* 94*  CO2 26 25 24   BUN 14 16 16   CREATININE 0.89 1.01 1.11  CALCIUM 7.1* 7.1* 7.2*  MG  --  1.0* 1.8  GLUCOSE 121* 161* 124*    CBG (last 3)   Recent Labs  03/24/14 0817  GLUCAP 126*    Scheduled Meds: . doxycycline  100 mg Oral Q12H  . enoxaparin (LOVENOX) injection  40 mg Subcutaneous Q24H  . folic acid  1 mg Oral Daily  . ipratropium-albuterol  3 mL Nebulization Q4H  . magnesium sulfate 1 - 4 g bolus IVPB  2 g Intravenous Once  . methylPREDNISolone (SOLU-MEDROL) injection  125 mg Intravenous Once  . multivitamin with minerals  1 tablet Oral  Daily  . potassium chloride  40 mEq Oral Q4H  . sodium chloride  3 mL Intravenous Q12H  . thiamine  100 mg Oral Daily    Continuous Infusions:   Past Medical History  Diagnosis Date  . Gout     "right leg" (03/23/2014)  . Arthritis     "right leg" (03/23/2014)  . Oral cancer     Past Surgical History  Procedure Laterality Date  . Tonsillectomy and adenoidectomy  1955  . Floor of mouth biopsy  03/2014    "found cancer"    [place], [place], [place] Pager [phone] After Hours Pager

## 2014-03-24 NOTE — H&P (Signed)
INTERNAL MEDICINE TEACHING ATTENDING NOTE  Day 1 of stay  Patient name: Julian Zimmerman  MRN: [id number] Date of birth: 1946-01-15   68 y.o.with recent diagnosis of SCC of floor of mouth, surgery scheduled at [facility], admitted with SOB and wheezing. He has a history of COPD, heavy smoker 52 pack year history quit 2 weeks. He denies orthopnea, however has exertional shortness of breath. I have read the HPI provided in Dr Jodene Nam note and I agree with the description of complaints and chronology of events.    Physical Exam   When I met the patient this morning, he reported feeling much better. He felt his breathing was easier, and he was wheezing lesser, however it is present with exertion.  His vitals are stable.   General: Resting in bed. No distress. HEENT: PERRL, EOMI, no scleral icterus. Heart: RRR, no rubs, murmurs or gallops. Lungs: Mild wheezing bilaterally especially after a walk in the room, occasional rhonchi, no rales.  Abdomen: Soft, nontender, nondistended, BS present. Extremities: Warm, 1+ pedal edema, pedal pulses could not be appreciated due to edema. Neuro: Alert and oriented X3, cranial nerves II-XII grossly intact,  strength and sensation to light touch equal in bilateral upper and lower extremities  Reviewed labs and imaging.    Assessment and Plan     COPD exacerbation - We feel that the patient's pulmonary symptoms arise from COPD flare and I do not feel strongly about CAP. We will discontinue Ceftriaxone and Azithromycin and start doxycycline. Agree with steroids, and will continue duonebs.   Lung CT scan abnormalities/masses - Unsure etiology. Possible differentials - sarcoidosis, metastasis, primary malignancy. Radiology advise PET CT evaluation which is not available at [facility]. We will obtain advice from Pulmonology regarding this. If there is a need to transfer the patient to another facility, we will proceed with that so he can get the PET CT.   I have  seen and evaluated this patient and discussed it with my IM resident team.  Please see the rest of the plan per resident note from today.   [place], [place] 03/24/2014, 10:34 AM.

## 2014-03-24 NOTE — Progress Notes (Signed)
Subjective: Patient notes that he is still having SOB and some wheezing with exertion.  He reports that the nebulizer helped but that he only got it once overnight.  Otherwise he is feeling well without complaints.  He is eating a regular diet and reports he has good appetite. CIWA- 0 overnight Objective: Vital signs in last 24 hours: Filed Vitals:   03/23/14 1528 03/23/14 1937 03/23/14 2220 03/24/14 0455  BP: 152/88  125/81 141/87  Pulse: 108  100 107  Temp: 97.5 F (36.4 C)  99.5 F (37.5 C) 99.5 F (37.5 C)  TempSrc: Oral  Oral Oral  Resp: 20  20 20   Height: 6' (1.829 m)     Weight: 154 lb 15.7 oz (70.3 kg)     SpO2: 94% 94% 90% 90%   Weight change:  No intake or output data in the 24 hours ending 03/24/14 0922 General: resting in bed HEENT: PERRL, EOMI Cardiac: RRR, no murmurs Pulm: diffuse wheezing bilaterally Abd: soft, nontender, nondistended, BS present Ext: warm and well perfused, 1+ pedal edema Neuro: alert and oriented X3, cranial nerves II-XII grossly intact  Lab Results: Basic Metabolic Panel:  Recent Labs Lab 03/23/14 1810 03/24/14 0451  NA 137 138  K 3.0* 3.1*  CL 93* 94*  CO2 25 24  GLUCOSE 161* 124*  BUN 16 16  CREATININE 1.01 1.11  CALCIUM 7.1* 7.2*  MG 1.0* 1.8   Liver Function Tests:  Recent Labs Lab 03/23/14 1810  AST 33  ALT 21  ALKPHOS 124*  BILITOT 0.5  PROT 7.3  ALBUMIN 2.8*   No results found for this basename: LIPASE, AMYLASE,  in the last 168 hours No results found for this basename: AMMONIA,  in the last 168 hours CBC:  Recent Labs Lab 03/23/14 1045  WBC 5.8  HGB 10.6*  HCT 30.9*  MCV 88.0  PLT 313   Cardiac Enzymes: No results found for this basename: CKTOTAL, CKMB, CKMBINDEX, TROPONINI,  in the last 168 hours BNP:  Recent Labs Lab 03/23/14 1045  PROBNP 6297.0*   D-Dimer: No results found for this basename: DDIMER,  in the last 168 hours CBG:  Recent Labs Lab 03/24/14 0817  GLUCAP 126*   Micro  Results: No results found for this or any previous visit (from the past 240 hour(s)). Studies/Results: Dg Chest 2 View (if Patient Has Fever And/or Copd)  03/23/2014   CLINICAL DATA:  Shortness of breath  EXAM: CHEST  2 VIEW  COMPARISON:  PA and lateral chest x-ray of April 04, 2004.  FINDINGS: There are confluent masslike parenchymal densities in the right and left mid lung. The cardiac silhouette is normal in size. The pulmonary vascularity is not engorged. Small amounts of pleural fluid blunt the costophrenic angles laterally and thickened the fissures. The bony thorax is unremarkable.  IMPRESSION: Abnormal bilateral pulmonary parenchymal densities are suspicious for masses which may be infectious or neoplastic. Chest CT scanning now is recommended.   Electronically Signed   By: David  Martinique   On: 03/23/2014 10:53   Ct Angio Chest W/cm &/or Wo Cm  03/23/2014   CLINICAL DATA:  Shortness of breath, possible lung cancer - evaluate for PE  EXAM: CT ANGIOGRAPHY CHEST WITH CONTRAST  TECHNIQUE: Multidetector CT imaging of the chest was performed using the standard protocol during bolus administration of intravenous contrast. Multiplanar CT image reconstructions and MIPs were obtained to evaluate the vascular anatomy.  CONTRAST:  13mL OMNIPAQUE IOHEXOL 350 MG/ML SOLN  COMPARISON:  Chest x-ray obtained earlier today  FINDINGS: Mediastinum: Unremarkable CT appearance of the thyroid gland. Borderline mediastinal adenopathy. A prevascular lymph node measures 9 mm in short axis (image 35, series 4). Slightly enlarged right paratracheal node measures 1.3 cm in short axis (image 26, series 4). Unremarkable appearance of the esophagus.  Heart/Vascular: Adequate opacification of the pulmonary arteries to the proximal subsegmental level. No evidence of central filling defect to suggest acute pulmonary embolus. Atherosclerotic calcifications present within the coronary arteries. Borderline cardiomegaly with left ventricular  dilatation. Reflux of contrast material into the hepatic veins noted incidentally.  Lungs/Pleura: Small right pleural effusion versus pleural thickening. There is thickening calcified pleural plaque medially adjacent to the right lower lobe as well as a mild pleural calcification inferiorly overlying the hepatic dome. Extensive nodular low-attenuation implants present throughout the subpleural space, particularly within the pulmonary fissures. Exact measurement of the abnormalities is a difficult given that many of the in nodules are confluent and connect through the subpleural space. In the superior aspect of the right major fissure, an index dumbbell-shaped lesion measures 6.1 x 3.6 cm. On the left, in the major fissure a second dumbbell-shaped lesion measures 5.9 by 3.0 cm (image 58, series 4). Moderate centrilobular emphysema is present throughout the upper lungs. Respiratory motion limits evaluation for small pulmonary nodules. There is a single 1.6 x 1.3 cm nodular opacity in the left lower lobe (image 70, series 6). The margins of this nodule are somewhat hazy and ill-defined. It is unclear if this is secondary to ground-glass attenuation as can be seen in the setting of infection/ inflammation, or due to superimpose respiratory motion. There is diffuse bronchial wall thickening throughout the lungs.  Bones/Soft Tissues: No acute fracture or aggressive appearing lytic or blastic osseous lesion. No acute fracture or aggressive appearing lytic or blastic osseous lesion. Multilevel degenerative spurring throughout the thoracic spine.  Upper Abdomen: Punctate calcifications present throughout the spleen and liver suggesting old granulomatous disease. The images are mildly degraded by motion artifact. A 9 mm low-attenuation lesion in the upper pole of the right kidney is incompletely characterized but statistically likely a benign cyst.  Review of the MIP images confirms the above findings.  IMPRESSION: 1.  Negative for acute pulmonary embolus. 2. The numerous densities seen on the prior chest x-ray correspond with subpleural low-attenuation (water) masses scattered throughout the pleural surfaces of the pulmonary fissures indicative of a process. Given the overall distribution and borderline mediastinal lymphadenopathy, an atypical appearance of sarcoidosis is favored. However, the imaging appearance is not definitive in this example. Additional differential considerations given the very low attenuation of the masses include multi loculated pleural fluid, multiple bilateral pleural metastases from an unknown primary malignancy (possibly a mucinous adenocarcinoma given the low-attenuation), and less likely a primary pleural malignancy such as mesothelioma. Mesothelioma is considered less likely given the bilaterality of the masses and the unilateral distribution of calcified pleural plaques. 3. There is a solitary somewhat ill-defined and hazy 1.6 x 1.3 cm nodular opacity in the left lower lobe which may represent a focus of active infection/inflammation or a true pulmonary nodule. Overall, I would recommend further evaluation with PET-CT to evaluate for hypermetabolic activity in both the pleural lesions, and the left lower lobe nodule. If all lesions are hypermetabolic, than CT-guided biopsy would be warranted. If the left lower lobe pulmonary nodule is intensely hypermetabolic, CT-guided biopsy would be warranted. If, however the left lower lobe pulmonary nodule is only mildly hypermetabolic then both repeat imaging  following an appropriate course of antimicrobial therapy or CT-guided biopsy would be reasonable approaches. 4. Nonspecific mediastinal adenopathy may be reactive / granulomatous or metastatic. 5. 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 6. Calcified pleural plaques in the medial and inferior aspect of the right hemi thorax. Given the unilaterality, this likely represents the  sequelae of a prior infectious/inflammatory process or sequelae of remote hemothorax. Asbestos related pleural disease is less likely. 7. Cardiomegaly with left ventricular dilatation. 8. Atherosclerosis including coronary artery disease. 9. Sequelae of old granulomatous disease involving the spleen and liver.   Electronically Signed   By: Jacqulynn Cadet M.D.   On: 03/23/2014 13:21   Medications: I have reviewed the patient's current medications. Scheduled Meds: . doxycycline  100 mg Oral Q12H  . enoxaparin (LOVENOX) injection  40 mg Subcutaneous Q24H  . folic acid  1 mg Oral Daily  . ipratropium-albuterol  3 mL Nebulization Q4H  . magnesium sulfate 1 - 4 g bolus IVPB  2 g Intravenous Once  . methylPREDNISolone (SOLU-MEDROL) injection  125 mg Intravenous Once  . multivitamin with minerals  1 tablet Oral Daily  . potassium chloride  40 mEq Oral Q4H  . sodium chloride  3 mL Intravenous Q12H  . thiamine  100 mg Oral Daily   Continuous Infusions:  PRN Meds:.acetaminophen, acetaminophen, albuterol, LORazepam, LORazepam Assessment/Plan:   Acute exacerbation of chronic obstructive pulmonary disease (COPD) - Patient continues with diffuse wheezing. Current O2 sat 90% on room air. - Give Solumedrol 125mg , change tomorrow to prednisone 40mg  daily x 5 days - Change Abx to Doxycycline 100mg  BID treat for 5 days - Duoneb treatment Q4 scheduled - Albuterol Q4prn -  Given multiple abnormalities of lungs and possibility of lung metastasis consulted Pulmonology to evaluate patient- Dr. Nelda Marseille to see.    Hypokalemia with Hypomagnesemia  - Improved to 3.1 mEq/L.  Mag 1.0 >>>1.8 after 4 grams IV. - Give additional 2g IV, goal Mag >2. - KDur 58meq x2.  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  He does have outpatient PET-CT scheduled at [facility] for 6/26.    Alcohol abuse -  CIWA- 0, continue to monitor for withdraw - MVI, Thiamine, Folate  DVT PPx: Lovenox  Diet: Regular  Code: Full Dispo: Disposition is deferred at this time, awaiting improvement of current medical problems.  Anticipated discharge in approximately 1-2 day(s).   The patient does not have a current PCP (No Pcp Per Patient) and does need an [facility] hospital follow-up appointment after discharge.  The patient does not have transportation limitations that hinder transportation to clinic appointments.  .Services Needed at time of discharge: Y = Yes, Blank = No PT:   OT:   RN:   Equipment:   Other:     LOS: 1 day   Joni Reining, DO 03/24/2014, 9:22 AM

## 2014-03-25 DIAGNOSIS — R911 Solitary pulmonary nodule: Secondary | ICD-10-CM

## 2014-03-25 DIAGNOSIS — R0609 Other forms of dyspnea: Secondary | ICD-10-CM

## 2014-03-25 DIAGNOSIS — R0989 Other specified symptoms and signs involving the circulatory and respiratory systems: Secondary | ICD-10-CM

## 2014-03-25 DIAGNOSIS — I369 Nonrheumatic tricuspid valve disorder, unspecified: Secondary | ICD-10-CM

## 2014-03-25 DIAGNOSIS — J90 Pleural effusion, not elsewhere classified: Secondary | ICD-10-CM

## 2014-03-25 LAB — BASIC METABOLIC PANEL
BUN: 26 mg/dL — AB (ref 6–23)
BUN: 31 mg/dL — AB (ref 6–23)
CHLORIDE: 95 meq/L — AB (ref 96–112)
CO2: 22 meq/L (ref 19–32)
CO2: 23 mEq/L (ref 19–32)
Calcium: 7.8 mg/dL — ABNORMAL LOW (ref 8.4–10.5)
Calcium: 7.7 mg/dL — ABNORMAL LOW (ref 8.4–10.5)
Chloride: 96 mEq/L (ref 96–112)
Creatinine, Ser: 1.31 mg/dL (ref 0.50–1.35)
Creatinine, Ser: 1.22 mg/dL (ref 0.50–1.35)
GFR calc Af Amer: 63 mL/min — ABNORMAL LOW (ref >90)
GFR calc Af Amer: 69 mL/min — ABNORMAL LOW (ref >90)
GFR calc non Af Amer: 55 mL/min — ABNORMAL LOW (ref >90)
GFR, EST NON AFRICAN AMERICAN: 60 mL/min — AB (ref >90)
Glucose, Bld: 182 mg/dL — ABNORMAL HIGH (ref 70–99)
Glucose, Bld: 174 mg/dL — ABNORMAL HIGH (ref 70–99)
Potassium: 3.8 mEq/L (ref 3.7–5.3)
Potassium: 3.5 mEq/L — ABNORMAL LOW (ref 3.7–5.3)
SODIUM: 136 meq/L — AB (ref 137–147)
Sodium: 137 mEq/L (ref 137–147)

## 2014-03-25 LAB — GLUCOSE, CAPILLARY
GLUCOSE-CAPILLARY: 159 mg/dL — AB (ref 70–99)
GLUCOSE-CAPILLARY: 134 mg/dL — AB (ref 70–99)
Glucose-Capillary: 126 mg/dL — ABNORMAL HIGH (ref 70–99)
Glucose-Capillary: 195 mg/dL — ABNORMAL HIGH (ref 70–99)

## 2014-03-25 LAB — HEMOGLOBIN A1C
Hgb A1c MFr Bld: 6.2 % — ABNORMAL HIGH (ref <5.7)
MEAN PLASMA GLUCOSE: 131 mg/dL — AB (ref <117)

## 2014-03-25 LAB — MAGNESIUM
Magnesium: 1.8 mg/dL (ref 1.5–2.5)
Magnesium: 1.5 mg/dL (ref 1.5–2.5)

## 2014-03-25 MED ORDER — MAGNESIUM SULFATE 40 MG/ML IJ SOLN
2.0000 g | Freq: Once | INTRAMUSCULAR | Status: AC
  Administered 2014-03-25: 2 g via INTRAVENOUS
  Filled 2014-03-25: qty 50

## 2014-03-25 MED ORDER — POTASSIUM CHLORIDE CRYS ER 20 MEQ PO TBCR
40.0000 meq | EXTENDED_RELEASE_TABLET | Freq: Two times a day (BID) | ORAL | Status: AC
  Administered 2014-03-25 – 2014-03-26 (×2): 40 meq via ORAL
  Filled 2014-03-25 (×2): qty 2

## 2014-03-25 MED ORDER — PREDNISONE 20 MG PO TABS
40.0000 mg | ORAL_TABLET | Freq: Every day | ORAL | Status: DC
  Administered 2014-03-25 – 2014-03-28 (×4): 40 mg via ORAL
  Filled 2014-03-25 (×5): qty 2

## 2014-03-25 MED ORDER — IPRATROPIUM-ALBUTEROL 0.5-2.5 (3) MG/3ML IN SOLN
3.0000 mL | Freq: Three times a day (TID) | RESPIRATORY_TRACT | Status: DC
  Administered 2014-03-26 – 2014-03-27 (×4): 3 mL via RESPIRATORY_TRACT
  Filled 2014-03-25 (×4): qty 3

## 2014-03-25 MED ORDER — BOOST / RESOURCE BREEZE PO LIQD
1.0000 | Freq: Three times a day (TID) | ORAL | Status: DC
  Administered 2014-03-25 – 2014-03-26 (×2): 1 via ORAL
  Filled 2014-03-25 (×6): qty 1

## 2014-03-25 NOTE — Progress Notes (Signed)
Received call from telemetry tech that pt had 5 beats of V Tach.  Upon assessment, pt was in the bathroom and denied any complaints of chest pain, rapid heart rate or dizziness.  BP 147/99, HR 103.  Informed Dr. Redmond Pulling.  Instructed to reassess vitals at 8pm.

## 2014-03-25 NOTE — Consult Note (Signed)
NAMEDOUGLES, KIMMEY NO.:  [id number]  MEDICAL RECORD NO.:  [id number]  LOCATION:  5W32C                        FACILITY:  [facility]  PHYSICIAN:  Kathee Delton, MD,FCCPDATE OF BIRTH:  1946[birth year]  DATE OF CONSULTATION:  03/25/2014 DATE OF DISCHARGE:                                CONSULTATION   REFERRING PHYSICIAN:  Internal Medicine Teaching Service.  HISTORY OF PRESENT ILLNESS:  The patient is a very pleasant 68 year old gentleman with very little prior medical care, who I have been asked to see for an abnormal CT chest.  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  He presented to the emergency room with progressive dyspnea on exertion, and a CT angio was done in order to rule out a pulmonary embolus.  This did not show a PET, but did show borderline lymphadenopathy, a small ground-glass opacity in the left lower lobe, pleural thickening with nodularity, right-sided pleural plaques primarily, medially, and finally large low attenuation masses in the pleural space and fissures bilaterally.  The patient has a long history of smoking, and probably has COPD.  He is being treated for a COPD exacerbation.  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  The patient denies any exposure to tuberculosis, but has never had a PPD placed.  He did, however, live in [place] for many years, but never in the [place].  The patient has very little cough and no congestion, but occasionally produces scant purulent mucus.  He has not had excessive weight loss.  PAST MEDICAL HISTORY:  Significant for gout as well as arthritis.  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  FAMILY HISTORY:   Significant for diabetes only.  SOCIAL HISTORY:  He is married and has worked for [employer], and [employer] as well as [employer].  He has a history of smoking 1 pack per day for 52 years, but has not smoked since the beginning of the month.  REVIEW OF SYSTEMS:  A 10-point review of systems was negative except for that mentioned in history of present illness.  PHYSICAL EXAMINATION:  GENERAL:  He is a well-developed male, in no acute distress. VITAL SIGNS:  Blood pressure 132/86, pulse is 100, respiratory rate 18. He is afebrile.  Oxygen saturation on room air is 92%-94%. HEENT:  Pupils equal, round, reactive to light and accommodation. Extraocular muscles are intact.  Nares are patent without discharge. Oropharynx is clear. NECK:  Without lymphadenopathy or thyromegaly. CHEST:  Very diminished breath sounds throughout, but no true wheezing. There was prominent upper airway pseudo wheezing that resolved with pursed lip breathing. CARDIAC:  Distant heart sounds but regular, 2/6 systolic murmur. ABDOMEN:  Soft, nontender, nondistended.  Good bowel sounds. GENITAL EXAM, RECTAL EXAM< BREAST EXAM:  Not done and not indicated. EXTREMITIES:  Lower extremities with edema noted, no calf tenderness and no cyanosis.  NEUROLOGICAL:  He is alert and oriented and moves all 4 extremities without deficits.  IMPRESSION: 1. Abnormal CT chest with low attenuation masses in the pleural space     bilaterally and especially in the fissures.  It is unclear whether     this is simply loculated fluid, or whether this is an inflammatory     pleural process.  I would find it very unlikely this is a     malignancy, but certainly cannot exclude.  The patient has had very     little medical care over his life, and therefore no serial chest x-     rays for comparison.  The patient did have a chest x-ray in 2005     that did not showed these abnormalities.  The patient also has a      ground glass opacity in the left lower lobes that may be     inflammatory, but this may be an early malignancy.  He really does     not have significant mediastinal adenopathy.  It is very difficult     to put all of this together, but the patient does have     granulomatous changes in his liver and spleen, and had lived in     [place] for many years of his life.  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  I would find this to be a very rare presentation     of sarcoidosis, however, nothing can be excluded at this point.  In     terms of planning, I would support following through with a PET     scan, and see where things stand.  At some point he may need     aspiration of one of these low attenuation masses that are abutting the right chest wall.     The pleural process may be nothing to worry about, and just represent loculated fluid.     I suspect none of this is related to his dyspnea on exertion, which     is probably due to COPD.  SUGGESTIONS: 1. would place a PPD for completeness. 2. Agree with PET scanning, and then decide about further evaluation.     Kathee Delton, MD,FCCP     [facility]  D:  03/25/2014  T:  03/25/2014  Job:  [id number]

## 2014-03-25 NOTE — Progress Notes (Signed)
  Echocardiogram 2D Echocardiogram has been performed.  Julian Zimmerman 03/25/2014, 5:10 PM

## 2014-03-25 NOTE — Progress Notes (Signed)
Subjective: No overnight events. He states that his breathing got much better after nebulizer treatments but he has not had one yet this morning and has some wheezing.   Objective: Vital signs in last 24 hours: Filed Vitals:   03/24/14 1351 03/24/14 1852 03/25/14 0009 03/25/14 0602  BP: 150/90 151/108 136/92 132/86  Pulse: 101 100 91 101  Temp: 97.9 F (36.6 C) 97.9 F (36.6 C) 97.8 F (36.6 C) 98.6 F (37 C)  TempSrc: Oral Oral Axillary Oral  Resp: 18 18 18 18   Height:      Weight:      SpO2: 93% 94% 93% 92%   Weight change:   Intake/Output Summary (Last 24 hours) at 03/25/14 0908 Last data filed at 03/24/14 1800  Gross per 24 hour  Intake    480 ml  Output      0 ml  Net    480 ml   General: Sitting in bed, eating breakfast, in NAD, wife at his bedside HEENT: no scleral icterus Cardiac: RRR, no murmurs Pulm: diffuse inspiratory wheezing bilaterally  Abd: soft, nontender, nondistended, BS present  Ext: warm and well perfused, 1+ pedal edema  Neuro: alert and oriented X3, cranial nerves II-XII grossly intact   Lab Results: Basic Metabolic Panel:  Recent Labs Lab 03/24/14 0451 03/24/14 2025 03/25/14 0448  NA 138 134* 136*  K 3.1* 3.6* 3.8  CL 94* 92* 95*  CO2 24 22 22   GLUCOSE 124* 324* 182*  BUN 16 23 26*  CREATININE 1.11 1.29 1.31  CALCIUM 7.2* 7.5* 7.8*  MG 1.8  --  1.8   Liver Function Tests:  Recent Labs Lab 03/23/14 1810  AST 33  ALT 21  ALKPHOS 124*  BILITOT 0.5  PROT 7.3  ALBUMIN 2.8*   CBC:  Recent Labs Lab 03/23/14 1045  WBC 5.8  HGB 10.6*  HCT 30.9*  MCV 88.0  PLT 313   BNP:  Recent Labs Lab 03/23/14 1045  PROBNP 6297.0*   CBG:  Recent Labs Lab 03/24/14 0817 03/24/14 2151 03/25/14 0812  GLUCAP 126* 381* 159*    Micro Results: Recent Results (from the past 240 hour(s))  CULTURE, BLOOD (ROUTINE X 2)     Status: None   Collection Time    03/23/14  2:24 PM      Result Value Ref Range Status   Specimen  Description BLOOD RIGHT ARM   Final   Special Requests BOTTLES DRAWN AEROBIC AND ANAEROBIC 5ML   Final   Culture  Setup Time     Final   Value: 03/23/2014 22:01     Performed at [facility]   Culture     Final   Value:        BLOOD CULTURE RECEIVED NO GROWTH TO DATE CULTURE WILL BE HELD FOR 5 DAYS BEFORE ISSUING A FINAL NEGATIVE REPORT     Performed at [facility]   Report Status PENDING   Incomplete  CULTURE, BLOOD (ROUTINE X 2)     Status: None   Collection Time    03/23/14  2:36 PM      Result Value Ref Range Status   Specimen Description BLOOD ARM LEFT   Final   Special Requests BOTTLES DRAWN AEROBIC AND ANAEROBIC [facility]   Final   Culture  Setup Time     Final   Value: 03/23/2014 22:01     Performed at [facility]   Culture     Final   Value:  BLOOD CULTURE RECEIVED NO GROWTH TO DATE CULTURE WILL BE HELD FOR 5 DAYS BEFORE ISSUING A FINAL NEGATIVE REPORT     Performed at [facility]   Report Status PENDING   Incomplete   Studies/Results: Dg Chest 2 View (if Patient Has Fever And/or Copd)  03/23/2014   CLINICAL DATA:  Shortness of breath  EXAM: CHEST  2 VIEW  COMPARISON:  PA and lateral chest x-ray of April 04, 2004.  FINDINGS: There are confluent masslike parenchymal densities in the right and left mid lung. The cardiac silhouette is normal in size. The pulmonary vascularity is not engorged. Small amounts of pleural fluid blunt the costophrenic angles laterally and thickened the fissures. The bony thorax is unremarkable.  IMPRESSION: Abnormal bilateral pulmonary parenchymal densities are suspicious for masses which may be infectious or neoplastic. Chest CT scanning now is recommended.   Electronically Signed   By: David  Martinique   On: 03/23/2014 10:53   Ct Angio Chest W/cm &/or Wo Cm  03/23/2014   CLINICAL DATA:  Shortness of breath, possible lung cancer - evaluate for PE  EXAM: CT ANGIOGRAPHY CHEST WITH CONTRAST  TECHNIQUE: Multidetector CT imaging  of the chest was performed using the standard protocol during bolus administration of intravenous contrast. Multiplanar CT image reconstructions and MIPs were obtained to evaluate the vascular anatomy.  CONTRAST:  166mL OMNIPAQUE IOHEXOL 350 MG/ML SOLN  COMPARISON:  Chest x-ray obtained earlier today  FINDINGS: Mediastinum: Unremarkable CT appearance of the thyroid gland. Borderline mediastinal adenopathy. A prevascular lymph node measures 9 mm in short axis (image 35, series 4). Slightly enlarged right paratracheal node measures 1.3 cm in short axis (image 26, series 4). Unremarkable appearance of the esophagus.  Heart/Vascular: Adequate opacification of the pulmonary arteries to the proximal subsegmental level. No evidence of central filling defect to suggest acute pulmonary embolus. Atherosclerotic calcifications present within the coronary arteries. Borderline cardiomegaly with left ventricular dilatation. Reflux of contrast material into the hepatic veins noted incidentally.  Lungs/Pleura: Small right pleural effusion versus pleural thickening. There is thickening calcified pleural plaque medially adjacent to the right lower lobe as well as a mild pleural calcification inferiorly overlying the hepatic dome. Extensive nodular low-attenuation implants present throughout the subpleural space, particularly within the pulmonary fissures. Exact measurement of the abnormalities is a difficult given that many of the in nodules are confluent and connect through the subpleural space. In the superior aspect of the right major fissure, an index dumbbell-shaped lesion measures 6.1 x 3.6 cm. On the left, in the major fissure a second dumbbell-shaped lesion measures 5.9 by 3.0 cm (image 58, series 4). Moderate centrilobular emphysema is present throughout the upper lungs. Respiratory motion limits evaluation for small pulmonary nodules. There is a single 1.6 x 1.3 cm nodular opacity in the left lower lobe (image 70, series  6). The margins of this nodule are somewhat hazy and ill-defined. It is unclear if this is secondary to ground-glass attenuation as can be seen in the setting of infection/ inflammation, or due to superimpose respiratory motion. There is diffuse bronchial wall thickening throughout the lungs.  Bones/Soft Tissues: No acute fracture or aggressive appearing lytic or blastic osseous lesion. No acute fracture or aggressive appearing lytic or blastic osseous lesion. Multilevel degenerative spurring throughout the thoracic spine.  Upper Abdomen: Punctate calcifications present throughout the spleen and liver suggesting old granulomatous disease. The images are mildly degraded by motion artifact. A 9 mm low-attenuation lesion in the upper pole of the right kidney  is incompletely characterized but statistically likely a benign cyst.  Review of the MIP images confirms the above findings.  IMPRESSION: 1. Negative for acute pulmonary embolus. 2. The numerous densities seen on the prior chest x-ray correspond with subpleural low-attenuation (water) masses scattered throughout the pleural surfaces of the pulmonary fissures indicative of a process. Given the overall distribution and borderline mediastinal lymphadenopathy, an atypical appearance of sarcoidosis is favored. However, the imaging appearance is not definitive in this example. Additional differential considerations given the very low attenuation of the masses include multi loculated pleural fluid, multiple bilateral pleural metastases from an unknown primary malignancy (possibly a mucinous adenocarcinoma given the low-attenuation), and less likely a primary pleural malignancy such as mesothelioma. Mesothelioma is considered less likely given the bilaterality of the masses and the unilateral distribution of calcified pleural plaques. 3. There is a solitary somewhat ill-defined and hazy 1.6 x 1.3 cm nodular opacity in the left lower lobe which may represent a focus of  active infection/inflammation or a true pulmonary nodule. Overall, I would recommend further evaluation with PET-CT to evaluate for hypermetabolic activity in both the pleural lesions, and the left lower lobe nodule. If all lesions are hypermetabolic, than CT-guided biopsy would be warranted. If the left lower lobe pulmonary nodule is intensely hypermetabolic, CT-guided biopsy would be warranted. If, however the left lower lobe pulmonary nodule is only mildly hypermetabolic then both repeat imaging following an appropriate course of antimicrobial therapy or CT-guided biopsy would be reasonable approaches. 4. Nonspecific mediastinal adenopathy may be reactive / granulomatous or metastatic. 5. 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 6. Calcified pleural plaques in the medial and inferior aspect of the right hemi thorax. Given the unilaterality, this likely represents the sequelae of a prior infectious/inflammatory process or sequelae of remote hemothorax. Asbestos related pleural disease is less likely. 7. Cardiomegaly with left ventricular dilatation. 8. Atherosclerosis including coronary artery disease. 9. Sequelae of old granulomatous disease involving the spleen and liver.   Electronically Signed   By: Jacqulynn Cadet M.D.   On: 03/23/2014 13:21   Medications: I have reviewed the patient's current medications. Scheduled Meds: . doxycycline  100 mg Oral Q12H  . enoxaparin (LOVENOX) injection  40 mg Subcutaneous Q24H  . feeding supplement (RESOURCE BREEZE)  1 Container Oral TID BM  . folic acid  1 mg Oral Daily  . insulin aspart  0-15 Units Subcutaneous TID WC  . insulin aspart  0-5 Units Subcutaneous QHS  . ipratropium-albuterol  3 mL Nebulization Q6H  . multivitamin with minerals  1 tablet Oral Daily  . sodium chloride  3 mL Intravenous Q12H  . thiamine  100 mg Oral Daily   Continuous Infusions:  PRN Meds:.acetaminophen, acetaminophen, albuterol, LORazepam,  LORazepam Assessment/Plan: Acute exacerbation of chronic obstructive pulmonary disease (COPD) - Patient continues with wheezing but improved from yesterday. Current O2 sat 92% on room air. - Received Solumedrol 125mg  once on 6/20  - Start  prednisone 40mg  daily x 5 days  (day 1) - Continue Doxycycline 100mg  BID (day 2) - Duoneb treatment Q4 scheduled  - Albuterol Q4prn  - Ambulate patient with pulse oxygen monitoring - Given multiple abnormalities of lungs and possibility of lung metastasis consulted Pulmonology to evaluate patient- Dr. Gwenette Greet to see.   Hypokalemia with Hypomagnesemia -- Potassium of 3.0 on presentation, gradually improved to 3.8 after repeated repletion. Mag 1.0 on presentation, improved to 1.8 after 6 grams IV.  - Repeat BMP tomorrow  EKG changes -  No chest pain, troponin negative, EKG with T wave flattening and inversion, no prior EKG for comparison.  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 He does have outpatient PET[facility]CT scheduled at [facility] for [facility]/26.   Alcohol abuse  - CIWA- 0, continue to monitor for withdraw  - MVI, Thiamine, Folate   Protein-calorie malnutrition- Likely due to chronic illness.  -Breeze supplement 3x daily after meals.    DVT PPx: Lovenox  Diet: Regular  Code: Full   Dispo: Disposition is deferred at this time, awaiting improvement of current medical problems. Anticipated discharge in approximately 1-2 day(s).    The patient does not have a current PCP (No Pcp Per Patient) and does not need an [facility] hospital follow-up appointment after discharge.  The patient does not have transportation limitations that hinder transportation to clinic appointments.  .Services Needed at time of discharge: Y = Yes, Blank = No PT:   OT:   RN:   Equipment:   Other:     LOS: 2 days   Blain Pais, MD 03/25/2014, 9:08 AM

## 2014-03-25 NOTE — Consult Note (Signed)
Dictation #:  [id number]

## 2014-03-26 ENCOUNTER — Other Ambulatory Visit: Payer: Self-pay | Admitting: Internal Medicine

## 2014-03-26 ENCOUNTER — Encounter (HOSPITAL_COMMUNITY): Payer: Self-pay | Admitting: Physician Assistant

## 2014-03-26 DIAGNOSIS — J441 Chronic obstructive pulmonary disease with (acute) exacerbation: Secondary | ICD-10-CM

## 2014-03-26 DIAGNOSIS — R030 Elevated blood-pressure reading, without diagnosis of hypertension: Secondary | ICD-10-CM

## 2014-03-26 DIAGNOSIS — Z87891 Personal history of nicotine dependence: Secondary | ICD-10-CM

## 2014-03-26 DIAGNOSIS — R918 Other nonspecific abnormal finding of lung field: Secondary | ICD-10-CM | POA: Diagnosis present

## 2014-03-26 DIAGNOSIS — R7301 Impaired fasting glucose: Secondary | ICD-10-CM | POA: Diagnosis present

## 2014-03-26 DIAGNOSIS — R9431 Abnormal electrocardiogram [ECG] [EKG]: Secondary | ICD-10-CM

## 2014-03-26 DIAGNOSIS — E876 Hypokalemia: Secondary | ICD-10-CM

## 2014-03-26 DIAGNOSIS — R9439 Abnormal result of other cardiovascular function study: Secondary | ICD-10-CM

## 2014-03-26 DIAGNOSIS — I5021 Acute systolic (congestive) heart failure: Principal | ICD-10-CM

## 2014-03-26 DIAGNOSIS — I517 Cardiomegaly: Secondary | ICD-10-CM

## 2014-03-26 DIAGNOSIS — R3129 Other microscopic hematuria: Secondary | ICD-10-CM | POA: Diagnosis not present

## 2014-03-26 DIAGNOSIS — I10 Essential (primary) hypertension: Secondary | ICD-10-CM

## 2014-03-26 DIAGNOSIS — I509 Heart failure, unspecified: Secondary | ICD-10-CM

## 2014-03-26 DIAGNOSIS — F1010 Alcohol abuse, uncomplicated: Secondary | ICD-10-CM

## 2014-03-26 LAB — LIPID PANEL
Cholesterol: 228 mg/dL — ABNORMAL HIGH (ref 0–200)
HDL: 70 mg/dL (ref >39)
LDL CALC: 142 mg/dL — AB (ref 0–99)
Total CHOL/HDL Ratio: 3.3 RATIO
Triglycerides: 78 mg/dL (ref <150)
VLDL: 16 mg/dL (ref 0–40)

## 2014-03-26 LAB — BASIC METABOLIC PANEL
BUN: 32 mg/dL — AB (ref 6–23)
CHLORIDE: 94 meq/L — AB (ref 96–112)
CO2: 21 mEq/L (ref 19–32)
CREATININE: 1.22 mg/dL (ref 0.50–1.35)
Calcium: 7.7 mg/dL — ABNORMAL LOW (ref 8.4–10.5)
GFR calc Af Amer: 69 mL/min — ABNORMAL LOW (ref >90)
GFR calc non Af Amer: 60 mL/min — ABNORMAL LOW (ref >90)
Glucose, Bld: 143 mg/dL — ABNORMAL HIGH (ref 70–99)
Potassium: 3.7 mEq/L (ref 3.7–5.3)
Sodium: 135 mEq/L — ABNORMAL LOW (ref 137–147)

## 2014-03-26 LAB — URINE MICROSCOPIC-ADD ON

## 2014-03-26 LAB — URINALYSIS, ROUTINE W REFLEX MICROSCOPIC
Bilirubin Urine: NEGATIVE
Glucose, UA: NEGATIVE mg/dL
Ketones, ur: NEGATIVE mg/dL
LEUKOCYTES UA: NEGATIVE
NITRITE: NEGATIVE
Protein, ur: 100 mg/dL — AB
SPECIFIC GRAVITY, URINE: 1.02 (ref 1.005–1.030)
UROBILINOGEN UA: 0.2 mg/dL (ref 0.0–1.0)
pH: 5 (ref 5.0–8.0)

## 2014-03-26 LAB — CBC
HCT: 31.3 % — ABNORMAL LOW (ref 39.0–52.0)
Hemoglobin: 10.4 g/dL — ABNORMAL LOW (ref 13.0–17.0)
MCH: 29.1 pg (ref 26.0–34.0)
MCHC: 33.2 g/dL (ref 30.0–36.0)
MCV: 87.7 fL (ref 78.0–100.0)
Platelets: 448 10*3/uL — ABNORMAL HIGH (ref 150–400)
RBC: 3.57 MIL/uL — ABNORMAL LOW (ref 4.22–5.81)
RDW: 14.9 % (ref 11.5–15.5)
WBC: 10.9 10*3/uL — ABNORMAL HIGH (ref 4.0–10.5)

## 2014-03-26 LAB — GLUCOSE, CAPILLARY
GLUCOSE-CAPILLARY: 140 mg/dL — AB (ref 70–99)
GLUCOSE-CAPILLARY: 115 mg/dL — AB (ref 70–99)
Glucose-Capillary: 165 mg/dL — ABNORMAL HIGH (ref 70–99)
Glucose-Capillary: 167 mg/dL — ABNORMAL HIGH (ref 70–99)

## 2014-03-26 LAB — MAGNESIUM: Magnesium: 1.9 mg/dL (ref 1.5–2.5)

## 2014-03-26 LAB — PROTIME-INR
INR: 1.15 (ref 0.00–1.49)
Prothrombin Time: 14.5 seconds (ref 11.6–15.2)

## 2014-03-26 MED ORDER — SODIUM CHLORIDE 0.9 % IV SOLN: 250.0000 mL | INTRAVENOUS | Status: DC | PRN

## 2014-03-26 MED ORDER — ASPIRIN 81 MG PO CHEW
81.0000 mg | CHEWABLE_TABLET | ORAL | Status: AC
  Administered 2014-03-27: 81 mg via ORAL
  Filled 2014-03-26: qty 1

## 2014-03-26 MED ORDER — PREDNISONE 20 MG PO TABS: 40.0000 mg | ORAL_TABLET | Freq: Every day | ORAL | Status: DC

## 2014-03-26 MED ORDER — SODIUM CHLORIDE 0.9 % IJ SOLN
3.0000 mL | Freq: Two times a day (BID) | INTRAMUSCULAR | Status: DC
  Administered 2014-03-26: 3 mL via INTRAVENOUS

## 2014-03-26 MED ORDER — POTASSIUM CHLORIDE ER 20 MEQ PO TBCR: 40.0000 meq | EXTENDED_RELEASE_TABLET | Freq: Every day | ORAL | Status: DC

## 2014-03-26 MED ORDER — LISINOPRIL 10 MG PO TABS
10.0000 mg | ORAL_TABLET | Freq: Every day | ORAL | Status: DC
  Administered 2014-03-26 – 2014-03-27 (×2): 10 mg via ORAL
  Filled 2014-03-26 (×3): qty 1

## 2014-03-26 MED ORDER — ASPIRIN EC 81 MG PO TBEC
81.0000 mg | DELAYED_RELEASE_TABLET | Freq: Every day | ORAL | Status: DC
  Administered 2014-03-26 – 2014-03-28 (×3): 81 mg via ORAL
  Filled 2014-03-26 (×4): qty 1

## 2014-03-26 MED ORDER — SODIUM CHLORIDE 0.9 % IJ SOLN: 3.0000 mL | INTRAMUSCULAR | Status: DC | PRN

## 2014-03-26 MED ORDER — MAGNESIUM OXIDE 400 (241.3 MG) MG PO TABS
400.0000 mg | ORAL_TABLET | Freq: Two times a day (BID) | ORAL | Status: DC
  Administered 2014-03-26 – 2014-03-27 (×4): 400 mg via ORAL
  Filled 2014-03-26 (×6): qty 1

## 2014-03-26 MED ORDER — TIOTROPIUM BROMIDE MONOHYDRATE 18 MCG IN CAPS: 18.0000 ug | ORAL_CAPSULE | Freq: Every day | RESPIRATORY_TRACT | Status: DC

## 2014-03-26 MED ORDER — CARVEDILOL 6.25 MG PO TABS
6.2500 mg | ORAL_TABLET | Freq: Two times a day (BID) | ORAL | Status: DC
  Administered 2014-03-26 – 2014-03-28 (×4): 6.25 mg via ORAL
  Filled 2014-03-26 (×6): qty 1

## 2014-03-26 MED ORDER — SODIUM CHLORIDE 0.9 % IV SOLN
1.0000 mL/kg/h | INTRAVENOUS | Status: DC
  Administered 2014-03-27: 1 mL/kg/h via INTRAVENOUS

## 2014-03-26 MED ORDER — TUBERCULIN PPD 5 UNIT/0.1ML ID SOLN
5.0000 [IU] | Freq: Once | INTRADERMAL | Status: AC
  Administered 2014-03-26: 5 [IU] via INTRADERMAL
  Filled 2014-03-26: qty 0.1

## 2014-03-26 MED ORDER — MAGNESIUM OXIDE 400 (241.3 MG) MG PO TABS: 400.0000 mg | ORAL_TABLET | Freq: Two times a day (BID) | ORAL | Status: DC

## 2014-03-26 MED ORDER — ALBUTEROL SULFATE HFA 108 (90 BASE) MCG/ACT IN AERS: 2.0000 | INHALATION_SPRAY | Freq: Four times a day (QID) | RESPIRATORY_TRACT | Status: DC | PRN

## 2014-03-26 MED ORDER — BOOST / RESOURCE BREEZE PO LIQD: 1.0000 | Freq: Three times a day (TID) | ORAL | Status: DC

## 2014-03-26 MED ORDER — DOXYCYCLINE HYCLATE 100 MG PO TABS: 100.0000 mg | ORAL_TABLET | Freq: Two times a day (BID) | ORAL | Status: DC

## 2014-03-26 NOTE — Progress Notes (Signed)
Saw the patient today. Doing well. I agree with Dr Gregery Na exam and note. Given new echo finding of severely low EF, I would involve cardiology in the case today and get their opinion before the patient is discharged.  I have discussed the care of this patient with my IM team and please refer to Dr Jodene Nam note for details of management.

## 2014-03-26 NOTE — Care Management Note (Addendum)
    Page 1 of 2   03/28/2014     2:49:58 PM CARE MANAGEMENT NOTE 03/28/2014  Patient:  Julian Zimmerman, Julian Zimmerman   Account Number:  [id number]  Date Initiated:  03/26/2014  Documentation initiated by:  Tomi Bamberger  Subjective/Objective Assessment:   dx hypokalemia  admit- lives with spouse.     Action/Plan:   needs neb machine and home oxygen.   Anticipated DC Date:  03/27/2014   Anticipated DC Plan:  [place]  CM consult      PAC Choice  DURABLE MEDICAL EQUIPMENT   Choice offered to / List presented to:  C-1 Patient   DME arranged  OXYGEN  NEBULIZER MACHINE      DME agency  [place] arranged  [place] - 11 Patient Refused      Status of service:  Completed, signed off Medicare Important Message given?  YES (If response is "NO", the following Medicare IM given date fields will be blank) Date Medicare IM given:  03/26/2014 Date Additional Medicare IM given:  03/28/2014  Discharge Disposition:  HOME/SELF CARE  Per UR Regulation:    If discussed at Long Length of Stay Meetings, dates discussed:    Comments:  03/28/14 Fuller Mandril, RN, BSN, NCM [phone] I have recommended that this patient have [facility] services but he declines at this time. I have discussed the risks and benefits of HH with him. The patient verbalizes understanding.  DMEs identified were: oxygen and nebulizer machine.  DME ordered through [facility] and delivered to pt room prior to D/C home.  03/27/14 [place], BSN [phone] patient did not dc on 6/22, patient with severe low EF, will need LH today per cards.  03/26/14 [place], BSN [phone] patient lives with spouse, patient will need home oxygen at dc, patient chose [facility], referral made to [facility] , Brenton Grills will bring oxygen to patient's room.

## 2014-03-26 NOTE — Consult Note (Signed)
CARDIOLOGY CONSULT NOTE   Patient ID: Julian Zimmerman MRN: [id number] DOB/AGE: August 25, 1946 68 y.o.  Admit date: 03/23/2014  Primary Physician   No PCP Per Patient Primary Cardiologist   New Reason for Consultation  CHF, newly diagnosed low EF  HPI: Julian Zimmerman is a 68 y.o. male with no prior medical care and a history of tobacco/alcohol abuse, recently diagnosed oral SCC who presented to [facility] on 03/23/14 with progressive SOB on exertion progressing over the past 2 weeks. He was found to be hypokalemic with hypomagnesemia. A chest CT was done to rule out pulmonary embolism and showed abnormalities concerning for metastases. A PET scan was recommended. A echocardiogram was performed which revealed a newly reduced ejection fraction of 20-25% and cardiology was consulted.  He had been to the dentist recently who noticed a lesion in his mouth and referred him to a ENT doctor. The lesion has been confirmed SCC and he will be followed up in the [facility]. He was otherwise in his usual state of health but started to notice increasing shortness of breath. His breathing got so labored on the day of admission that he decided to evaluated in the emergency department. He also notes associated symptoms of productive cough (yellow sputum) x2 weeks as well as lower extremity swelling. He denies fevers/ chills, chest pain, orthopnea, PND, nausea/vomiting, dysuria, abdominal pain, constipation, diarrhea. No history of bleeding. His wife states that he does snore. He denies a family history of heart disease. Both of his parents died from complications of diabetes. He smoked cigarettes for 52 years and quit a month ago. He drinks 5 shots of vodka a night and his wife reports that she thinks it may be a problem. In the ED a CTA of the chest was negative for pulmonary embolism but did show multiple areas of low attenuation masses. He was seen by pulmonology who recommended PET scanning.    Past Medical History    Diagnosis Date  . Gout     "right leg" (03/23/2014)  . Arthritis     "right leg" (03/23/2014)  . Oral cancer      Past Surgical History  Procedure Laterality Date  . Tonsillectomy and adenoidectomy  1955  . Floor of mouth biopsy  03/2014    "found cancer"    No Known Allergies  I have reviewed the patient's current medications . aspirin EC  81 mg Oral Daily  . carvedilol  6.25 mg Oral BID WC  . doxycycline  100 mg Oral Q12H  . enoxaparin (LOVENOX) injection  40 mg Subcutaneous Q24H  . feeding supplement (RESOURCE BREEZE)  1 Container Oral TID BM  . folic acid  1 mg Oral Daily  . insulin aspart  0-15 Units Subcutaneous TID WC  . insulin aspart  0-5 Units Subcutaneous QHS  . ipratropium-albuterol  3 mL Nebulization TID  . lisinopril  10 mg Oral Daily  . magnesium oxide  400 mg Oral BID  . multivitamin with minerals  1 tablet Oral Daily  . predniSONE  40 mg Oral Q breakfast  . sodium chloride  3 mL Intravenous Q12H  . thiamine  100 mg Oral Daily  . tuberculin  5 Units Intradermal Once     acetaminophen, acetaminophen, albuterol, LORazepam, LORazepam  Prior to Admission medications   Medication Sig Start Date End Date Taking? Authorizing Provider  albuterol (PROVENTIL HFA;VENTOLIN HFA) 108 (90 BASE) MCG/ACT inhaler Inhale 2 puffs into the lungs every 6 (six) hours  as needed for wheezing or shortness of breath. 03/26/14   Joni Reining, DO  doxycycline (VIBRA-TABS) 100 MG tablet Take 1 tablet (100 mg total) by mouth every 12 (twelve) hours. 03/26/14 03/28/14  Joni Reining, DO  feeding supplement, RESOURCE BREEZE, (RESOURCE BREEZE) LIQD Take 1 Container by mouth 3 (three) times daily between meals. 03/26/14   Joni Reining, DO  magnesium oxide (MAG-OX) 400 (241.3 MG) MG tablet Take 1 tablet (400 mg total) by mouth 2 (two) times daily. 03/26/14   Joni Reining, DO  potassium chloride 20 MEQ TBCR Take 40 mEq by mouth daily. 03/26/14   Joni Reining, DO  predniSONE (DELTASONE) 20 MG tablet  Take 2 tablets (40 mg total) by mouth daily with breakfast. 03/26/14   Joni Reining, DO  tiotropium (SPIRIVA HANDIHALER) 18 MCG inhalation capsule Place 1 capsule (18 mcg total) into inhaler and inhale daily. 03/26/14   Joni Reining, DO     History   Social History  . Marital Status: Married    Spouse Name: N/A    Number of Children: N/A  . Years of Education: N/A   Occupational History  . 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 Archivist    "making uranium pelets"   .  [place] History Main Topics  . Smoking status: Former Smoker -- 1.00 packs/day for 52 years    Types: Cigarettes    Quit date: 03/05/2014  . Smokeless tobacco: Never Used  . Alcohol Use: 16.8 oz/week    28 Shots of liquor per week     Comment: 03/23/2014 "4, 1oz shots/day"  . Drug Use: No  . Sexual Activity: Not Currently   Other Topics Concern  . Not on file   Social History Narrative  . No narrative on file    No family status information on file.   Family History  Problem Relation Age of Onset  . Diabetes Mother   . Diabetes Father      ROS:  Full 14 point review of systems complete and found to be negative unless listed above.  Physical Exam: Blood pressure 141/98, pulse 92, temperature 97.3 F (36.3 C), temperature source Axillary, resp. rate 18, height 6' (1.829 m), weight 154 lb 15.7 oz (70.3 kg), SpO2 94.00%.  General: Well developed, well nourished, male in no acute distress. Head: Eyes PERRLA, No xanthomas.   Normocephalic and atraumatic, oropharynx without edema or exudate. Dentition:  Lungs: some mild diffuse wheezes. Prolonged expiratory phase Heart: HRRR S1 S2, no rub/gallop, Heart irregular rate and rhythm with S1, S2  murmur. Split second heart sound.  Neck: No carotid bruits. No lymphadenopathy.  No JVD. Abdomen: Bowel sounds present, abdomen soft and non-tender without masses or hernias noted. Msk:  No spine or cva tenderness. No weakness, no joint  deformities or effusions. Extremities: No clubbing or cyanosis. 1+ edema.  Neuro: Alert and oriented X 3. No focal deficits noted. Psych:  Good affect, responds appropriately Skin: No rashes or lesions noted.  Labs:   Lab Results  Component Value Date   WBC 5.8 03/23/2014   HGB 10.6* 03/23/2014   HCT 30.9* 03/23/2014   MCV 88.0 03/23/2014   PLT 313 03/23/2014     Recent Labs Lab 03/23/14 1810  03/26/14 0409  NA 137  < > 135*  K 3.0*  < > 3.7  CL 93*  < > 94*  CO2 25  < > 21  BUN 16  < > 32*  CREATININE 1.01  < >  1.22  CALCIUM 7.1*  < > 7.7*  PROT 7.3  --   --   BILITOT 0.5  --   --   ALKPHOS 124*  --   --   ALT 21  --   --   AST 33  --   --   GLUCOSE 161*  < > 143*  ALBUMIN 2.8*  --   --   < > = values in this interval not displayed. Magnesium  Date Value Ref Range Status  03/26/2014 1.9  1.5 - 2.5 mg/dL Final   Pro B Natriuretic peptide (BNP)  Date/Time Value Ref Range Status  03/23/2014 10:45 AM 6297.0* 0 - 125 pg/mL Final     Echo: Study Date: 03/25/2014 LV EF: 20% - 25% Study Conclusions - Left ventricle: The cavity size was mildly dilated. Wall thickness was normal. Systolic function was severely reduced. The estimated ejection fraction was in the range of 20% to 25%. - Mitral valve: There was mild regurgitation. - Left atrium: The atrium was moderately dilated. - Right ventricle: The cavity size was mildly dilated. - Right atrium: The atrium was mildly dilated. - Tricuspid valve: There was moderate regurgitation. - Pulmonary arteries: PA peak pressure: 52 mm Hg (S).  CXR There are confluent masslike parenchymal densities in the right and left mid lung. The cardiac silhouette is normal in size. The pulmonary vascularity is not engorged. Small amounts of pleural fluid blunt the costophrenic angles laterally and thickened the fissures. The bony thorax is unremarkable. IMPRESSION: Abnormal bilateral pulmonary parenchymal densities are suspicious for  masses which may be infectious or neoplastic. Chest CT scanning now is recommended.  ECG:  LVH, TWIs in V5 V6. Prolonged QT.   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 male with no prior medical care and a history of tobacco/alcohol abuse, recently diagnosed oral SCC who presented to [facility] on 03/23/14 with progressive SOB on exertion progressing over the past 2 weeks. He was found to be hypokalemic with hypomagnesemia. A chest CT was done to rule out pulmonary embolism and showed abnormalities concerning for metastases. A PET scan was recommended. A echocardiogram was performed which revealed a newly reduced ejection fraction of 20-25% and cardiology was consulted.  Acute systolic CHF- New onset. Echo report: LVEF 20-25%, BNP 6.3K. CXR w/ small amounts of pleural fluid blunt the costophrenic angles laterally and thickened fissures. No pulmonary edema  -- Troponin neg x1; EKG still has prolonged QT and some T wave abnormalities. Will order a lipid panel for completeness.  -- Added Coreg 6.25 mg twice a day and lisinopril 10 mg daily -- Will schedule L/RHC tomorrow to rule out ischemic CM as well as measure heart pressure pressures. If he has elevated filling pressures will likely add on a diuretic. This will be especially important in the setting of squamous cell carcinoma resection as well as possible need for chemotherapeutic agents.  Dyspnea likely due to CHF and COPD -- O2 sat remains >93 %  on room air and his breathing is  much improved -- He was placed on prednisone, antibiotics and nebulizers -- Follow up with Dr.Clance 7/9  -- Follow up with new PCP 7/6   Hypokalemia with Hypomagnesemia  -- Improved to 3.7 mEq/L after supplementation. Mag 1.5 and he was given additional 2g. -- Continue supplementation at discharge with Magox 400mg  BID and Kdur 92mEq daily   QT Prolongation  -- Noted on EKG, likely due to hypoK and hypomag. Continue to correct and repeat EKG.   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 He does have outpatient PET-CT scheduled at [facility] for 6/26.  -- Patient has follow up with [facility] on 6/29. He will be given a copy of this discharge summary to provide to this physician.   HTN -- Patient with hypertensive blood pressures since admission. Patient likely has longstanding untreated chronic hypertension.  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 Cr has been stable at 1.2. Monitor closely in the setting of contrast exposure  Impaired fasting glucose  -- A1c 6.2. Educated on diet and exercise.   NSVT- 5 beats of V Tach on tele. No sx. continue to monitor  Abnormal CT. Will place a PPD for completeness per pulmonology -- PET scanning, and then decide about further evaluation.   SignedPerry Mount, PA-C 03/26/2014 4:15 PM  Pager [phone]  Co-Sign MD   Patient examined chart reviewed.  New diagnosis of severe LV dysfunction with elevated BNP.  Severe COPD.  Needs Rx of tongue cancer at [facility].  Discussed not being able to use chemo But sounds like ENT recommended initial surgical Rx.  ECG abnormal with lateral T wave inversions not clearly LVH.  Discussed right and left heart cath for am  Start coreg and lisinopril  BMET in am BUN and Cr elevated No diuretic appears to have been given in house  Repeat in am before cath May need diuretic if filling pressures high.  Important to r/o CAD given need for  surgery , NSVT and severe decrease in LV function.  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

## 2014-03-26 NOTE — Progress Notes (Addendum)
SATURATION QUALIFICATIONS: (This note is used to comply with regulatory documentation for home oxygen)  Patient Saturations on Room Air at Rest = 92%  Patient Saturations on Room Air while Ambulating = 87%  Patient Saturations on 3Liters of oxygen while Ambulating = 100%  Please briefly explain why patient needs home oxygen: COPD

## 2014-03-26 NOTE — Progress Notes (Addendum)
Subjective: Patient receiving breathing treatment at this time.  He reports he feels much better and want to go home today if possible.  He has previously been seen at [facility] and would like to follow up there. Objective: Vital signs in last 24 hours: Filed Vitals:   03/25/14 1938 03/25/14 2000 03/26/14 0513 03/26/14 0608  BP: 147/99 140/93 168/112 150/90  Pulse: 102 105 105   Temp:  97.7 F (36.5 C) 97.9 F (36.6 C)   TempSrc:  Axillary Axillary   Resp:  18 18   Height:      Weight:      SpO2: 95% 95% 92%    Weight change:   Intake/Output Summary (Last 24 hours) at 03/26/14 0755 Last data filed at 03/25/14 1419  Gross per 24 hour  Intake    660 ml  Output      0 ml  Net    660 ml   General: resting in bed Cardiac: RRR, no murmurs Pulm: very mild end expiratory wheezing Abd: soft, nontender, nondistended, BS present Ext: warm and well perfused, 1+ pedal edema Neuro: alert and oriented X3 Lab Results: Basic Metabolic Panel:  Recent Labs Lab 03/25/14 0448 03/25/14 2018 03/26/14 0409  NA 136* 137 135*  K 3.8 3.5* 3.7  CL 95* 96 94*  CO2 22 23 21   GLUCOSE 182* 174* 143*  BUN 26* 31* 32*  CREATININE 1.31 1.22 1.22  CALCIUM 7.8* 7.7* 7.7*  MG 1.8 1.5  --    Liver Function Tests:  Recent Labs Lab 03/23/14 1810  AST 33  ALT 21  ALKPHOS 124*  BILITOT 0.5  PROT 7.3  ALBUMIN 2.8*   No results found for this basename: LIPASE, AMYLASE,  in the last 168 hours No results found for this basename: AMMONIA,  in the last 168 hours CBC:  Recent Labs Lab 03/23/14 1045  WBC 5.8  HGB 10.6*  HCT 30.9*  MCV 88.0  PLT 313   Cardiac Enzymes: No results found for this basename: CKTOTAL, CKMB, CKMBINDEX, TROPONINI,  in the last 168 hours BNP:  Recent Labs Lab 03/23/14 1045  PROBNP 6297.0*   D-Dimer: No results found for this basename: DDIMER,  in the last 168 hours CBG:  Recent Labs Lab 03/24/14 2151 03/25/14 0812 03/25/14 1149  03/25/14 1735 03/25/14 2103 03/26/14 0747  GLUCAP 381* 159* 134* 126* 195* 140*   Micro Results: Recent Results (from the past 240 hour(s))  CULTURE, BLOOD (ROUTINE X 2)     Status: None   Collection Time    03/23/14  2:24 PM      Result Value Ref Range Status   Specimen Description BLOOD RIGHT ARM   Final   Special Requests BOTTLES DRAWN AEROBIC AND ANAEROBIC 5ML   Final   Culture  Setup Time     Final   Value: 03/23/2014 22:01     Performed at [facility]   Culture     Final   Value:        BLOOD CULTURE RECEIVED NO GROWTH TO DATE CULTURE WILL BE HELD FOR 5 DAYS BEFORE ISSUING A FINAL NEGATIVE REPORT     Performed at [facility]   Report Status PENDING   Incomplete  CULTURE, BLOOD (ROUTINE X 2)     Status: None   Collection Time    03/23/14  2:36 PM      Result Value Ref Range Status   Specimen Description BLOOD ARM LEFT  Final   Special Requests BOTTLES DRAWN AEROBIC AND ANAEROBIC [facility]   Final   Culture  Setup Time     Final   Value: 03/23/2014 22:01     Performed at [facility]   Culture     Final   Value:        BLOOD CULTURE RECEIVED NO GROWTH TO DATE CULTURE WILL BE HELD FOR 5 DAYS BEFORE ISSUING A FINAL NEGATIVE REPORT     Performed at [facility]   Report Status PENDING   Incomplete   Studies/Results: No results found. Medications: I have reviewed the patient's current medications. Scheduled Meds: . doxycycline  100 mg Oral Q12H  . enoxaparin (LOVENOX) injection  40 mg Subcutaneous Q24H  . feeding supplement (RESOURCE BREEZE)  1 Container Oral TID BM  . folic acid  1 mg Oral Daily  . insulin aspart  0-15 Units Subcutaneous TID WC  . insulin aspart  0-5 Units Subcutaneous QHS  . ipratropium-albuterol  3 mL Nebulization TID  . multivitamin with minerals  1 tablet Oral Daily  . potassium chloride  40 mEq Oral BID  . predniSONE  40 mg Oral Q breakfast  . sodium chloride  3 mL Intravenous Q12H  . thiamine  100 mg Oral Daily    Continuous Infusions:  PRN Meds:.acetaminophen, acetaminophen, albuterol, LORazepam, LORazepam Assessment/Plan:   Acute exacerbation of chronic obstructive pulmonary disease (COPD) -  O2 sat remains >93 % on room air -  prednisone 40mg  daily x 5 days - Doxycycline 100mg  BID treat for 5 days - Duoneb treatment TID scheduled - Albuterol Q4prn - Ambulate patient and document O2 sat.  If tolerates on room air can likely d/c home to continue treatment as outpatient. -  Pulmonology has seen patient, patient recommends PET scan, suspect current dyspnea due to COPD.   - TTE read pending - Follow up with Dr.Clance 7/9 - Follow up with new PCP 7/6    Hypokalemia with Hypomagnesemia  - Improved to 3.7 mEq/L this am.  Mag 1.5 last PM given additional 2g. - Patient due for second dose of KDur 22mEq. (has been getting around 61mEq daily) - EKG still has prolonged QT and some T wave abnormalities - Repeat EKG - Continue supplementation at discharge with Magox 400mg  BID and Kdur 60mEq daily  QT Prolongation -Noted on EKG, likely due to hypoK and hypomag.  Continue to correct and repeat EKG. 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  He does have outpatient PET-CT scheduled at [facility] for 6/26. - Patient has follow up with [facility] on 6/29.    Alcohol abuse - CIWA remained 0, now discontinued - MVI, Thiamine, Folate  Cardiomegaly - Noted on CXR, CT. Likely due to longstanding chronic hypertension - TTE read pending.  Elevated Blood Pressure -Patient with hypertensive blood pressures since admission.  Patient likely has longstanding untreated chronic hypertension. 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  Cr has been stable at 1.2 - Will check U/A - Patient may benefit from ACEi at hospital follow up  Impaired fasting glucose - A1c 6.2.  Educated on diet and  exercise.   ADDENDUM: Echo report: LVEF 20-25%, this is likely contributory to patient's dyspnea.  Will hold off on discharge and consult cardiology to evaluate patient.  Will start ASA 81mg  daily.  DVT PPx: Lovenox  Diet: Regular  Code: Full Dispo: Possible discharge in AM.  The patient does not have a current PCP (No Pcp Per Patient) and does need an [facility] hospital follow-up appointment after discharge.  The patient does not have transportation limitations that hinder transportation to clinic appointments.  .Services Needed at time of discharge: Y = Yes, Blank = No PT:   OT:   RN:   Equipment:   Other:     LOS: 3 days   Joni Reining, DO 03/26/2014, 7:55 AM

## 2014-03-26 NOTE — Discharge Summary (Signed)
Name: [name] MRN: [id number] DOB: 1946-04-07 68 y.o. PCP: No Pcp Per Patient  Date of Admission: 03/23/2014 10:25 AM Date of Discharge: 03/28/2014 Attending Physician: Dr. 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     albuterol (2.5 MG/3ML) 0.083% nebulizer solution  Commonly known as:  PROVENTIL  Take 3 mLs (2.5 mg total) by nebulization every 4 (four) hours as needed for wheezing or shortness of breath.     aspirin 81 MG EC tablet  Take 1 tablet (81 mg total) by mouth daily.     atorvastatin 40 MG tablet  Commonly known as:  LIPITOR  Take 1 tablet (40 mg total) by mouth daily at 6 PM.     carvedilol 6.25 MG tablet  Commonly known as:  COREG  Take 1 tablet (6.25 mg total) by mouth 2 (two) times daily with a meal.     feeding supplement (RESOURCE BREEZE) Liqd  Take 1 Container by mouth 3 (three) times daily between meals.     hydrALAZINE 10 MG tablet  Commonly known as:  APRESOLINE  Take 1 tablet (10 mg total) by mouth 3 (three) times daily.     isosorbide mononitrate 30 MG 24 hr tablet  Commonly known as:  IMDUR  Take 1 tablet (30 mg total) by mouth daily.     tiotropium 18 MCG inhalation capsule  Commonly known as:   SPIRIVA HANDIHALER  Place 1 capsule (18 mcg total) into inhaler and inhale daily.        Disposition and follow-up:   JulianTheodoros T Zimmerman was discharged from [facility] in [place] condition.  At the hospital follow up visit please address:  1.  Respiratory status (Is patient compliant with Spirivia, Albuterol, and home oxygen)  2. BP, NICM (patient started on Coreg 6.25mg  BID, Hydralazine 10mg  Q8, and IMDUR 30mg  daily) Please consider alternative of Lisinopril instead of Hydralazine/IMDUR if Cr stable. (Held lisinopril on discharge due to increasing Cr+ recent IV contrast Exposure).  Please also evaluate volume status and need for diuretic therapy.  3. Patient presented severely hypokalemic and hypomagnesemic please recheck K+ and Mag++ at follow up.  4. SCC of floor of mouth and abnormal CT chest: patient has outpatient PET CT scan scheduled for 03/31/14  5. Patient will need to follow up with Pulmonology, Cardiology, and ENT  2.  Labs / imaging needed at time of follow-up: BMP, Mag, U/A  3.  Pending labs/ test needing follow-up: PET-CT scheduled for 03/31/14  Follow-up Appointments: Follow-up Information   Follow up with Kathee Delton, MD On 04/12/2014. (11:30am   (Lung doctor follow up))    Specialty:  Pulmonary Disease   Contact information:   [address]  [place] [address] [phone]       Follow up with Marda Stalker, PA-C On 04/09/2014. (please arrive by 12:30pm for appointment at 1pm.  bring your medications, insurance card, and identification information)    Specialty:  Family Medicine   Contact information:   [address] [place] [address] [phone]       Follow up with [facility] On 03/29/2014. (for blood draw)    Contact information:   [place] [phone]       Follow up with Lyda Jester, PA-C On 04/04/2014. (@ 4pm. Please call to reschedule if this does not work. Dr Johnsie Cancel is your new  cardiologist )    Specialty:  Cardiology   Contact information:   [address] [place] [address] [phone]       Discharge Instructions: Discharge Instructions   (HEART FAILURE PATIENTS) Call MD:  Anytime you have any of the following symptoms: 1) 3 pound weight gain in 24 hours or 5 pounds in 1 week 2) shortness of breath, with or without a dry hacking cough 3) swelling in the hands, feet or stomach 4) if you have to sleep on extra pillows at night in order to breathe.    Complete by:  As directed      Call MD for:  difficulty breathing, headache or visual disturbances    Complete by:  As directed      Call MD for:  difficulty breathing, headache or visual disturbances    Complete by:  As directed      Call MD for:  temperature >100.4    Complete by:  As directed      Diet - low sodium heart healthy    Complete by:  As directed      Diet Carb Modified    Complete by:  As directed      Increase activity slowly    Complete by:  As directed      Increase activity slowly    Complete by:  As directed            Consultations:   Pulmonology: Dr. Gwenette Greet Cardiology: Dr. Baird Kay    Procedures Performed:  Dg Orthopantogram  03/14/2014   CLINICAL DATA:  Tongue pain.  EXAM: ORTHOPANTOGRAM/PANORAMIC  COMPARISON:  MRI brain 11/29/2007.  FINDINGS: Patient is partially edentulous. Lucency noted along the alveolar surface of the inferior right mandibular ramus. This could be from recent tooth extraction. Focal infection cannot be excluded. No evidence of fracture.  IMPRESSION: Lucency noted along the anterior alveolar surface of the inferior right mandibular ramus. This could be from recent tooth extraction. Focal infection cannot be excluded .   Electronically Signed   By: Marcello Moores  Register   On: 03/14/2014 09:05   Dg Chest 2 View (if Patient Has Fever And/or Copd)  03/23/2014   CLINICAL DATA:  Shortness of breath  EXAM: CHEST  2 VIEW  COMPARISON:  PA and  lateral chest x-ray of April 04, 2004.  FINDINGS: There are confluent masslike parenchymal densities in the right and left mid lung. The cardiac silhouette is normal in size. The pulmonary vascularity is not engorged. Small amounts of pleural fluid blunt the costophrenic angles laterally and thickened the fissures. The bony thorax is unremarkable.  IMPRESSION: Abnormal bilateral pulmonary parenchymal densities are suspicious for masses which may be infectious or neoplastic. Chest CT scanning now is recommended.   Electronically Signed   By: Shanon Brow  [place]   On: 03/23/2014 10:53   Ct Soft Tissue Neck W Contrast  03/14/2014   CLINICAL DATA:  Mass at the floor of the mouth.  EXAM: CT NECK WITH CONTRAST  TECHNIQUE: Multidetector CT imaging of the neck was performed using the standard protocol following the bolus administration of intravenous contrast.  CONTRAST:  59mL OMNIPAQUE IOHEXOL 300 MG/ML  SOLN  COMPARISON:  MRI brain 11/29/2007.  Panorex 03/14/2008.  FINDINGS: Lung apices show emphysema. There is pleural thickening of the major fissure on the left. No active process is evident.  Both parotid glands are normal. Both submandibular glands are normal. The thyroid gland is normal.  There is a mass at the floor of the mouth on the right involving the sublingual space without evidence of extension into the submandibular space. Fat planes are indistinct. There is probably involvement of the intrinsic musculature. Exact size cannot be established by CT, but this would be estimated to be on the order of 3 cm in diameter. The differential diagnosis is squamous cell carcinoma versus sublingual gland adenocarcinoma. There is no evidence of bone invasion of the mandible.  There is some asymmetry of the level 1 lymph nodes when comparing right to left. The largest level 1 lymph node measures 8 x 12 mm in diameter on the right as opposed to the left-sided node that measures 7 x 10 mm. A second adjacent level 1 node measures 7 x  8 mm as opposed to the left-sided node that measures 4 x 5 mm. Therefore, these right-sided level 1 nodes are suspicious for metastatic involvement.  No abnormal lymph nodes lower in the neck. No vascular lesion. No other mucosal abnormality lower in the neck. There is ordinary degenerative spondylosis of the cervical spine.  IMPRESSION: Ill-defined mass in the sublingual space on the right consistent with malignancy. This is difficult to measure precisely but is estimated at 3 cm in diameter. The differential diagnosis is squamous cell carcinoma of the floor of the mouth versus is adenocarcinoma of the sublingual gland. Asymmetric level 1 lymph nodes on the right are suspicious for metastatic involvement.   Electronically Signed   By: Nelson Chimes M.D.   On: 03/14/2014 10:58   Ct Angio Chest W/cm &/or Wo Cm  03/23/2014   CLINICAL DATA:  Shortness of breath, possible lung cancer - evaluate for PE  EXAM: CT ANGIOGRAPHY CHEST WITH CONTRAST  TECHNIQUE: Multidetector CT imaging of the chest was performed using the standard protocol during bolus administration of intravenous contrast. Multiplanar CT image reconstructions and MIPs were obtained to evaluate the vascular anatomy.  CONTRAST:  168mL OMNIPAQUE IOHEXOL 350 MG/ML SOLN  COMPARISON:  Chest x-ray obtained earlier today  FINDINGS: Mediastinum: Unremarkable CT appearance of the thyroid gland. Borderline mediastinal adenopathy. A prevascular lymph node measures 9 mm in short axis (image 35, series 4). Slightly enlarged right paratracheal node measures 1.3 cm in short axis (image 26, series 4). Unremarkable appearance of the esophagus.  Heart/Vascular: Adequate opacification of the pulmonary arteries to the proximal subsegmental level. No evidence of central filling defect to suggest acute pulmonary embolus. Atherosclerotic calcifications present within the coronary arteries. Borderline cardiomegaly with left ventricular dilatation. Reflux of contrast material into  the hepatic veins noted incidentally.  Lungs/Pleura: Small right pleural effusion versus pleural thickening. There is thickening calcified pleural plaque medially adjacent to the right lower lobe as well as a mild pleural calcification inferiorly overlying the hepatic dome. Extensive nodular low-attenuation implants present throughout the subpleural space, particularly  within the pulmonary fissures. Exact measurement of the abnormalities is a difficult given that many of the in nodules are confluent and connect through the subpleural space. In the superior aspect of the right major fissure, an index dumbbell-shaped lesion measures 6.1 x 3.6 cm. On the left, in the major fissure a second dumbbell-shaped lesion measures 5.9 by 3.0 cm (image 58, series 4). Moderate centrilobular emphysema is present throughout the upper lungs. Respiratory motion limits evaluation for small pulmonary nodules. There is a single 1.6 x 1.3 cm nodular opacity in the left lower lobe (image 70, series 6). The margins of this nodule are somewhat hazy and ill-defined. It is unclear if this is secondary to ground-glass attenuation as can be seen in the setting of infection/ inflammation, or due to superimpose respiratory motion. There is diffuse bronchial wall thickening throughout the lungs.  Bones/Soft Tissues: No acute fracture or aggressive appearing lytic or blastic osseous lesion. No acute fracture or aggressive appearing lytic or blastic osseous lesion. Multilevel degenerative spurring throughout the thoracic spine.  Upper Abdomen: Punctate calcifications present throughout the spleen and liver suggesting old granulomatous disease. The images are mildly degraded by motion artifact. A 9 mm low-attenuation lesion in the upper pole of the right kidney is incompletely characterized but statistically likely a benign cyst.  Review of the MIP images confirms the above findings.  IMPRESSION: 1. Negative for acute pulmonary embolus. 2. The  numerous densities seen on the prior chest x-ray correspond with subpleural low-attenuation (water) masses scattered throughout the pleural surfaces of the pulmonary fissures indicative of a process. Given the overall distribution and borderline mediastinal lymphadenopathy, an atypical appearance of sarcoidosis is favored. However, the imaging appearance is not definitive in this example. Additional differential considerations given the very low attenuation of the masses include multi loculated pleural fluid, multiple bilateral pleural metastases from an unknown primary malignancy (possibly a mucinous adenocarcinoma given the low-attenuation), and less likely a primary pleural malignancy such as mesothelioma. Mesothelioma is considered less likely given the bilaterality of the masses and the unilateral distribution of calcified pleural plaques. 3. There is a solitary somewhat ill-defined and hazy 1.6 x 1.3 cm nodular opacity in the left lower lobe which may represent a focus of active infection/inflammation or a true pulmonary nodule. Overall, I would recommend further evaluation with PET-CT to evaluate for hypermetabolic activity in both the pleural lesions, and the left lower lobe nodule. If all lesions are hypermetabolic, than CT-guided biopsy would be warranted. If the left lower lobe pulmonary nodule is intensely hypermetabolic, CT-guided biopsy would be warranted. If, however the left lower lobe pulmonary nodule is only mildly hypermetabolic then both repeat imaging following an appropriate course of antimicrobial therapy or CT-guided biopsy would be reasonable approaches. 4. Nonspecific mediastinal adenopathy may be reactive / granulomatous or metastatic. 5. 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 6. Calcified pleural plaques in the medial and inferior aspect of the right hemi thorax. Given the unilaterality, this likely represents the sequelae of a prior infectious/inflammatory  process or sequelae of remote hemothorax. Asbestos related pleural disease is less likely. 7. Cardiomegaly with left ventricular dilatation. 8. Atherosclerosis including coronary artery disease. 9. Sequelae of old granulomatous disease involving the spleen and liver.   Electronically Signed   By: Jacqulynn Cadet M.D.   On: 03/23/2014 13:21    2D Echo: Study Conclusions  - Left ventricle: The cavity size was mildly dilated. Wall thickness was normal. Systolic function was severely reduced. The estimated  ejection fraction was in the range of 20% to 25%. - Mitral valve: There was mild regurgitation. - Left atrium: The atrium was moderately dilated. - Right ventricle: The cavity size was mildly dilated. - Right atrium: The atrium was mildly dilated. - Tricuspid valve: There was moderate regurgitation. - Pulmonary arteries: PA peak pressure: 52 mm Hg (S).   Cardiac Cath: 03/27/14 IMPRESSIONS:  1. Widely patent left main coronary artery. 2. Moderate disease in the left anterior descending artery , including a focal 50% mid LAD lesion. This was not significant by FFR. 3. Small, patent left circumflex artery . Moderate disease in the ramus vessel. 4. Mild disease in a dominant right coronary artery. 5. Left ventricular systolic function not assessed. LVEDP 30 mmHg.    Admission HPI: Julian Zimmerman is a 68 yo man with recently diagnosed SCC of the throat (being worked up by Dr. Constance Holster (ENT)). He reports this is his second time in a hospital and does not have a regular doctor or seek any medical care (he was referred to Dr Constance Holster from a dentist). He presents to [facility] today with CC of progressive SOB on exertion progressing over the past 2 weeks. He notes associated symptoms of productive cough (yellow sputum) x2 weeks. He denies fevers/ chills, chest pain, nausea/vomiting, dysuria, abdominal pain, constipation, diarrhea. In the ED a CTA of the chest was negative for pulmonary embolism but did show  multiple areas of low attenuation masses.  In the ED initial labs revealed a potassium of 2.3 mEq/L and initial EKG showed some T wave abnormalities. He was given 40 mEq Kdur x 2 PO in ED and 2 runs of 10mg  IV potassium and IMTS was asked to admit for further workup and treatment of SOB and hypokalemia.    Hospital Course by problem list:   Hypokalemia and Hypomagnesemia - Initial labs revealed a potassium of 2.3 mEq/L with some T wave abnormalities, he was given replacement of potassium via IV and PO replacement and this was continued to be monitored throughout his hospitalization.  After addition of an ACEi his K= rose to 5.1 and his Cr increased, this was then held to be reevaluated at hospital follow up.  He was not given supplemental potassium on discharge.His magnesium was also found to be low a 1.0 mg/dL, he was given a total of 8g of IV magnesium and later oral supplementation.  On discharge his magnesium was 1.9 mg/dL.    Acute exacerbation of chronic obstructive pulmonary disease (COPD) - Patient with >50 pack/hear history of smoking presented with DOE.  Given his recent diagnosis of SCC there was concern for PE in the ED.  A CTA ruled out pulmonary embolism but did show chronic changes consistent with COPD and multiple areas of low attenuation masses.  He did have wheezing on exam and he was treated for AECOPD with steroids +doxycycline x5 days, duoneb, and supplemental oxygen.  On discharge he still desaturated to 87% on room air with ambulation and he was prescribed home oxygen at a rate of 2L via [place].  Pulmonology evaluated the patient and will follow up with him as an outpatient.  He will need formal PFTs in a few weeks.    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  He is currently follow with Dr. Constance Holster (ENT) in [facility] and has plans to see ENT at [facility] on 6/29 for consultation for surgery.      Non-ischemic cardiomyopathy with CAD (  coronary  artery disease) - Patient was found to have cardiomegaly on CT imaging, he underwent a TTE which revealed a severely decreased LVEF of 20-25%.  Cardiology was consulted and recommended left heart cathertization which was done 6/23 which revealed non obstructive CAD and an elevated LVEDP of 80mmHg.  Post cath he did have a mild elevation of creatine (Lisinopril also recently started), his ACEi was held and he was not diuresed while inpatient to allow recovery of renal function.  He was started on hydralazine and IMDUR and continued on low dose Coreg.  He was started on Atorvastatin 40mg  daily and 81mg  of aspirin daily.    Alcohol abuse - History of ETOH abuse however has recently stopped/cut back after diagnosis of cancer.  He was monitored on CIWA and showed no signs of withdraw.    Protein-calorie malnutrition, severe - Patient with recently diagnosed SCC with weight loss.  He was evaluated by our nutrition service and given nutrition supplements, he was encouraged to continue these on discharge.    Impaired fasting glucose - A1c was checked and was 6.2%.  Patient was informed of this and given material on a healthy diet.    Abnormal CT scan of lung - Evaluated by Pulmonology who agreed that patient will need to complete PET-CT imaging first.  After that is completed he will likely need biopsy.  He will follow up with [facility]mon[facility].  A PPD was placed and read as negative while inpatient.  A Quantiferon Gold assay was obtained but returned indeterminate.    Microscopic hematuria - Noted on U/A, given his smoking history a U/A should be repeated and if persistent he likely will need referral to Urology.  Dispo: We recommended that patient stay for at least an additional day in the hospital to monitor renal function, he had capacity and demanded to leave.  We arranged home O2 and a follow up BMP at the [facility] for the following day which revealed his Cr had decreased back to 1.2, and  his K was 3.8. Discharge Vitals:   BP 162/97  Pulse 90  Temp(Src) 97.3 F (36.3 C) (Oral)  Resp 15  Ht 6' (1.829 m)  Wt 179 lb 3.7 oz (81.3 kg)  BMI 24.30 kg/m2  SpO2 95%  Discharge Labs:  Results for orders placed during the hospital encounter of 03/23/14 (from the past 24 hour(s))  GLUCOSE, CAPILLARY     Status: Abnormal   Collection Time    03/27/14  5:33 PM      Result Value Ref Range   Glucose-Capillary 152 (*) 70 - 99 mg/dL   Comment 1 Notify RN    GLUCOSE, CAPILLARY     Status: Abnormal   Collection Time    03/27/14 10:26 PM      Result Value Ref Range   Glucose-Capillary 130 (*) 70 - 99 mg/dL   Comment 1 Notify RN     Comment 2 Documented in Chart    BASIC METABOLIC PANEL     Status: Abnormal   Collection Time    03/28/14  4:02 AM      Result Value Ref Range   Sodium 133 (*) 137 - 147 mEq/L   Potassium 5.1  3.7 - 5.3 mEq/L   Chloride 93 (*) 96 - 112 mEq/L   CO2 20  19 - 32 mEq/L   Glucose, Bld 119 (*) 70 - 99 mg/dL   BUN 49 (*) 6 - 23 mg/dL   Creatinine, Ser  1.37 (*) 0.50 - 1.35 mg/dL   Calcium 8.0 (*) 8.4 - 10.5 mg/dL   GFR calc non Af Amer 52 (*) >90 mL/min   GFR calc Af Amer 60 (*) >90 mL/min  GLUCOSE, CAPILLARY     Status: Abnormal   Collection Time    03/28/14  8:04 AM      Result Value Ref Range   Glucose-Capillary 118 (*) 70 - 99 mg/dL    Signed: Joni Reining, DO 03/28/2014, 5:11 PM   Services Ordered on Discharge: None Equipment Ordered on Discharge: Home O2, Home Nebulizer

## 2014-03-26 NOTE — Progress Notes (Signed)
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  Admitted 6/19 with dyspnea.  CTA to was done and was neg for PE but was otherwise abnormal PCCM consulted 6/21.   SIGNIFICANT EVENTS / STUDIES:  6/19  CTA chest 6/19 >>> Negative for pulmonary embolism, advanced COPD, solitary somewhat ill-defined and hazy 1.6 x 1.3 cm nodular opacity in the left lower lobe, borderline lymphadenopathy, pleural thickening with nodularity, right-sided pleural plaques primarily, medially, and large low attenuation masses in the pleural space and fissures bilaterally  LINES / TUBES:  CULTURES:  ANTIBIOTICS: Doxycycline 6/19 >>>  SUBJECTIVE: Feeling better.  Wants to go home.   VITAL SIGNS: Temp:  [97.7 F (36.5 C)-97.9 F (36.6 C)] 97.9 F (36.6 C) (06/22 0513) Pulse Rate:  [102-105] 105 (06/22 0513) Resp:  [18] 18 (06/22 0513) BP: (134-168)/(90-112) 150/90 mmHg (06/22 0608) SpO2:  [92 %-95 %] 92 % (06/22 0513)  PHYSICAL EXAMINATION: General:  Pleasant, thin male, NAD on side of bed Neuro:  Awake, alert, appropriate, MAE HEENT:  Mm moist, no JVD, no LA  Cardiovascular:  s1s2 rrr Lungs:  resps even non labored on [place], slightly diminished bases, otherwise ess clear, no audible wheeze  Abdomen:  Soft, +bs Musculoskeletal:  Warm and dry, no edema    Recent Labs Lab 03/25/14 0448 03/25/14 2018 03/26/14 0409  NA 136* 137 135*  K 3.8 3.5* 3.7  CL 95* 96 94*  CO2 22 23 21   BUN 26* 31* 32*  CREATININE 1.31 1.22 1.22  GLUCOSE 182* 174* 143*    Recent Labs Lab 03/23/14 1045  HGB 10.6*  HCT 30.9*  WBC 5.8  PLT 313   No results found.  ASSESSMENT /  PLAN:  AECOPD   Supplemental oxygen for SpO2>92  Ambulatory SpO2 before d/c, may need home oxygen  Scheduled Albuterol / Atrovent  Albuterol PRN  Complete systemic steroids / abx course   PFT / pulmonary follow up as outpatient  LLL pulmonary nodule Borderline mediastinal lymphadenopathy, nonspecific Multiple low density masses adjacent to pleural space / fissures - likely loculated pleural fluid, but may represent mesothelioma or metastatic pleural disease    PET as outpatient  Hypermetabolic nodule may be biopsied via ransthoracic CT guided approach or using electromagnetic navigational bronchoscopy ( ENB )  If bronchoscopic approach is chosen, endobronchial ultrasound ( EBUS )guided biopsy of the mediastinal lymph nodes can be attempted at the same time  Alternatively hypermetabolic nodule may be referred straight for wedge resection / biopsy ( if overall health status / PFT allows )  I nodule does not demonstrate significant metabolic activity, follow up with imaging may be appropriate  Right sided pleural plaques; likely a sequela to old inflammatory process / infection, less likely asbestos related; likely inconsequential to respiratory status  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 Histoplasmosis  )  Julian Madrid, NP 03/26/2014  9:24 AM Pager: [phone] or [phone]  PCCM will sign off.  Will be happy to see as outpatient.  I have personally obtained history, examined patient, evaluated and interpreted laboratory and imaging results, reviewed medical records, formulated assessment / plan and placed orders.  Doree Fudge, MD Pulmonary and [place] Pager: [phone]  03/26/2014, 12:01 PM

## 2014-03-27 ENCOUNTER — Encounter (HOSPITAL_COMMUNITY)
Admission: EM | Disposition: A | Discharge status: Home / Self Care | Payer: Self-pay | Source: Home / Self Care | Attending: Internal Medicine

## 2014-03-27 DIAGNOSIS — I2510 Atherosclerotic heart disease of native coronary artery without angina pectoris: Secondary | ICD-10-CM

## 2014-03-27 HISTORY — PX: LEFT AND RIGHT HEART CATHETERIZATION WITH CORONARY ANGIOGRAM: SHX5449

## 2014-03-27 HISTORY — PX: FRACTIONAL FLOW RESERVE WIRE: SHX5839

## 2014-03-27 LAB — POCT ACTIVATED CLOTTING TIME
Activated Clotting Time: 174 seconds
Activated Clotting Time: 202 seconds
Activated Clotting Time: 197 seconds
Activated Clotting Time: 174 seconds

## 2014-03-27 LAB — POCT I-STAT 3, VENOUS BLOOD GAS (G3P V)
ACID-BASE DEFICIT: 3 mmol/L — AB (ref 0.0–2.0)
Acid-base deficit: 3 mmol/L — ABNORMAL HIGH (ref 0.0–2.0)
Bicarbonate: 21 mEq/L (ref 20.0–24.0)
Bicarbonate: 21.4 mEq/L (ref 20.0–24.0)
O2 SAT: 39 %
O2 Saturation: 35 %
PCO2 VEN: 33.9 mmHg — AB (ref 45.0–50.0)
PO2 VEN: 21 mmHg — AB (ref 30.0–45.0)
TCO2: 22 mmol/L (ref 0–100)
TCO2: 22 mmol/L (ref 0–100)
pCO2, Ven: 33.3 mmHg — ABNORMAL LOW (ref 45.0–50.0)
pH, Ven: 7.408 — ABNORMAL HIGH (ref 7.250–7.300)
pH, Ven: 7.409 — ABNORMAL HIGH (ref 7.250–7.300)
pO2, Ven: 22 mmHg — CL (ref 30.0–45.0)

## 2014-03-27 LAB — BASIC METABOLIC PANEL
BUN: 40 mg/dL — ABNORMAL HIGH (ref 6–23)
CO2: 21 mEq/L (ref 19–32)
Calcium: 7.7 mg/dL — ABNORMAL LOW (ref 8.4–10.5)
Chloride: 92 mEq/L — ABNORMAL LOW (ref 96–112)
Creatinine, Ser: 1.24 mg/dL (ref 0.50–1.35)
GFR, EST AFRICAN AMERICAN: 68 mL/min — AB (ref >90)
GFR, EST NON AFRICAN AMERICAN: 58 mL/min — AB (ref >90)
Glucose, Bld: 125 mg/dL — ABNORMAL HIGH (ref 70–99)
POTASSIUM: 4.1 meq/L (ref 3.7–5.3)
SODIUM: 132 meq/L — AB (ref 137–147)

## 2014-03-27 LAB — GLUCOSE, CAPILLARY
GLUCOSE-CAPILLARY: 96 mg/dL (ref 70–99)
GLUCOSE-CAPILLARY: 130 mg/dL — AB (ref 70–99)
Glucose-Capillary: 152 mg/dL — ABNORMAL HIGH (ref 70–99)

## 2014-03-27 LAB — POCT I-STAT 3, ART BLOOD GAS (G3+)
ACID-BASE DEFICIT: 3 mmol/L — AB (ref 0.0–2.0)
Bicarbonate: 19.9 mEq/L — ABNORMAL LOW (ref 20.0–24.0)
O2 SAT: 90 %
PO2 ART: 54 mmHg — AB (ref 80.0–100.0)
TCO2: 21 mmol/L (ref 0–100)
pCO2 arterial: 28.2 mmHg — ABNORMAL LOW (ref 35.0–45.0)
pH, Arterial: 7.456 — ABNORMAL HIGH (ref 7.350–7.450)

## 2014-03-27 SURGERY — LEFT AND RIGHT HEART CATHETERIZATION WITH CORONARY ANGIOGRAM
Anesthesia: LOCAL

## 2014-03-27 MED ORDER — HEPARIN (PORCINE) IN NACL 2-0.9 UNIT/ML-% IJ SOLN
INTRAMUSCULAR | Status: AC
  Filled 2014-03-27: qty 1000

## 2014-03-27 MED ORDER — SODIUM CHLORIDE 0.9 % IJ SOLN
3.0000 mL | Freq: Two times a day (BID) | INTRAMUSCULAR | Status: DC
  Administered 2014-03-27: 21:00:00 3 mL via INTRAVENOUS

## 2014-03-27 MED ORDER — NITROGLYCERIN 0.2 MG/ML ON CALL CATH LAB
INTRAVENOUS | Status: AC
  Filled 2014-03-27: qty 1

## 2014-03-27 MED ORDER — ONDANSETRON HCL 4 MG/2ML IJ SOLN: 4.0000 mg | Freq: Four times a day (QID) | INTRAMUSCULAR | Status: DC | PRN

## 2014-03-27 MED ORDER — HEPARIN SODIUM (PORCINE) 1000 UNIT/ML IJ SOLN
INTRAMUSCULAR | Status: AC
  Filled 2014-03-27: qty 1

## 2014-03-27 MED ORDER — POTASSIUM CHLORIDE CRYS ER 20 MEQ PO TBCR
40.0000 meq | EXTENDED_RELEASE_TABLET | Freq: Every day | ORAL | Status: DC
  Administered 2014-03-27: 40 meq via ORAL
  Filled 2014-03-27: qty 2

## 2014-03-27 MED ORDER — SODIUM CHLORIDE 0.9 % IV SOLN: 250.0000 mL | INTRAVENOUS | Status: DC | PRN

## 2014-03-27 MED ORDER — MIDAZOLAM HCL 2 MG/2ML IJ SOLN
INTRAMUSCULAR | Status: AC
  Filled 2014-03-27: qty 2

## 2014-03-27 MED ORDER — ENOXAPARIN SODIUM 40 MG/0.4ML ~~LOC~~ SOLN
40.0000 mg | SUBCUTANEOUS | Status: DC
  Filled 2014-03-27: qty 0.4

## 2014-03-27 MED ORDER — SODIUM CHLORIDE 0.9 % IJ SOLN: 3.0000 mL | INTRAMUSCULAR | Status: DC | PRN

## 2014-03-27 MED ORDER — ADENOSINE 12 MG/4ML IV SOLN
16.0000 mL | INTRAVENOUS | Status: AC
  Administered 2014-03-27: 48 mg via INTRAVENOUS
  Filled 2014-03-27: qty 16

## 2014-03-27 MED ORDER — FENTANYL CITRATE 0.05 MG/ML IJ SOLN
INTRAMUSCULAR | Status: AC
  Filled 2014-03-27: qty 2

## 2014-03-27 MED ORDER — ACETAMINOPHEN 325 MG PO TABS: 650.0000 mg | ORAL_TABLET | ORAL | Status: DC | PRN

## 2014-03-27 MED ORDER — LIDOCAINE HCL (PF) 1 % IJ SOLN
INTRAMUSCULAR | Status: AC
  Filled 2014-03-27: qty 30

## 2014-03-27 NOTE — CV Procedure (Signed)
PROCEDURE:  Left heart catheterization with selective coronary angiography, left ventriculogram.  Right heart catheterization.  FFR of the LAD.  INDICATIONS:  Cardiomyopathy  The risks, benefits, and details of the procedure were explained to the patient.  The patient verbalized understanding and wanted to proceed.  Informed written consent was obtained.  PROCEDURE TECHNIQUE:  After Xylocaine anesthesia, a 7 French sheath was placed into the right femoral vein using modified Seldinger technique. After Xylocaine anesthesia a 39F sheath was placed in the right femoral artery with a single anterior needle wall stick.   The right heart catheterization was performed using a 7 French Swan-Ganz balloon tip catheter. This was advanced to pulmonary artery and hemodynamic measurement was obtained along with oxygen saturations. Left coronary angiography was done using a Judkins L4 guide catheter.  Right coronary angiography was done using a Judkins R4 guide catheter.  Left ventriculography was not done. Left heart catheterization was done with the JR 4 catheter. Manual compression will be used for hemostasis.   CONTRAST:  Total of 80 cc.  COMPLICATIONS:  None.    HEMODYNAMICS:  Aortic pressure was 125/87; LV pressure was 124/24; LVEDP 30.  There was no gradient between the left ventricle and aorta.  Right atrial pressure 25/26, mean right atrial pressure 21 mmHg; RV pressure 62/14, RBBB 23 mmHg pulmonary artery pressure 62/35, mean PA pressure 47 mmHg; pulmonary capillary wedge pressure 36/37, mean pulmonary A. wedge pressure 34 mmHg; PA saturation 39%; aortic saturation 90%. Cardiac output by Fick 3.5 L per minute ;cardiac index 1.84.  ANGIOGRAPHIC DATA:   The left main coronary artery is widely patent.  The left anterior descending artery is a large vessel which wraps around the apex. There is mild disease in the proximal vessel. In the mid vessel, there is a focal 50% lesion. There several medium-size  diagonals which are patent. The mid to distal LAD is widely patent past the focal stenosis..  The left circumflex artery is a small vessel. There is a large, ramus vessel. There is a moderate stenosis, up to 60% in the caudal views, before a bifurcation in this vessel. The vessel branches across the lateral wall. The true circumflex is small but patent.  The right coronary artery is a large dominant vessel. There is mild atherosclerosis proximally. There is a large posterior lateral artery which is widely patent. The posterior descending artery has mild atherosclerosis in the midportion.  LEFT VENTRICULOGRAM:  Left ventricular angiogram was not done.  Left heart catheterization was done with the JR 4 catheter.  LVEDP was elevated at 30 mmHg.  INTERVENTIONAL NARRATIVE: IV heparin was given. ACT these used to check that the heparin was therapeutic. A 5 French EBU 3.5 guiding catheter was used to engage the left main. A pressure wire was advanced to the left main and normalized. The wire was then placed across the stenosis. Resting FFR was 0.92. After maximal hyperemia with intravenous adenosine, the FFR decreased to 0.89. This was not significant. The wire was removed. Patient tolerated the procedure well.  IMPRESSIONS:  1. Widely patent left main coronary artery. 2. Moderate disease in the left anterior descending artery , including a focal 50% mid LAD lesion. This was not significant by FFR. 3. Small, patent left circumflex artery . Moderate disease in the ramus vessel. 4. Mild disease in a dominant right coronary artery. 5. Left ventricular systolic function not assessed.  LVEDP 30 mmHg.    RECOMMENDATION:  His cardiomyopathy diagnosed by echocardiogram is not  explained by coronary artery disease. Continue aggressive medical therapy to help improve left ventricular function.

## 2014-03-27 NOTE — Progress Notes (Signed)
    Subjective:  Denies CP; SOB with exertion   Objective:  Filed Vitals:   03/26/14 2108 03/26/14 2147 03/27/14 0705 03/27/14 0935  BP:  120/81 144/99 124/86  Pulse:  95 88 86  Temp:  97.5 F (36.4 C)  97 F (36.1 C)  TempSrc:  Axillary Axillary Axillary  Resp:      Height:      Weight:      SpO2: 95% 95% 94% 93%    Intake/Output from previous day:  Intake/Output Summary (Last 24 hours) at 03/27/14 1014 Last data filed at 03/27/14 0843  Gross per 24 hour  Intake 302.95 ml  Output      0 ml  Net 302.95 ml    Physical Exam: Physical exam: Well-developed well-nourished in no acute distress.  Skin is warm and dry.  HEENT is normal.  Neck is supple.  Chest with diminished BS throughout Cardiovascular exam is regular rate and rhythm.  Abdominal exam nontender or distended. No masses palpated. Extremities show 1+ edema. neuro grossly intact    Lab Results: Basic Metabolic Panel:  Recent Labs  03/25/14 2018 03/26/14 0409 03/26/14 1022 03/27/14 0402  NA 137 135*  --  132*  K 3.5* 3.7  --  4.1  CL 96 94*  --  92*  CO2 23 21  --  21  GLUCOSE 174* 143*  --  125*  BUN 31* 32*  --  40*  CREATININE 1.22 1.22  --  1.24  CALCIUM 7.7* 7.7*  --  7.7*  MG 1.5  --  1.9  --    CBC:  Recent Labs  03/26/14 1825  WBC 10.9*  HGB 10.4*  HCT 31.3*  MCV 87.7  PLT 448*     Assessment/Plan:  1 CM-plan for R and L cath today; risks and benefits discussed and patient agrees to proceed. Check TSH; continue ASA, ACEI and beta blocker. Add diuretic depending on results of RHC. 2 Dyspnea-most likely multifactoral including CHF but also with severe COPD on exam. 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 4 Renal insuff-follow renal function following procedure.  Kirk Ruths 03/27/2014, 10:14 AM

## 2014-03-27 NOTE — Progress Notes (Signed)
Subjective: Patient reports he is feeling much better this morning and notes his breathing is much improved.  He has no complaints and reports he is feeling well.  Objective: Vital signs in last 24 hours: Filed Vitals:   03/26/14 1949 03/26/14 2108 03/26/14 2147 03/27/14 0705  BP:   120/81 144/99  Pulse:   95 88  Temp:   97.5 F (36.4 C)   TempSrc:   Axillary Axillary  Resp:      Height:      Weight: 177 lb 0.5 oz (80.3 kg)     SpO2:  95% 95% 94%   Weight change:   Intake/Output Summary (Last 24 hours) at 03/27/14 0843 Last data filed at 03/27/14 0843  Gross per 24 hour  Intake 302.95 ml  Output      0 ml  Net 302.95 ml   General: resting in bed Cardiac: RRR, no murmurs Pulm: CTAB no w/r/r Abd: soft, nontender, nondistended, BS present Ext: warm and well perfused, 1+ pedal edema Neuro: alert and oriented X3 Lab Results: Basic Metabolic Panel:  Recent Labs Lab 03/25/14 2018 03/26/14 0409 03/26/14 1022 03/27/14 0402  NA 137 135*  --  132*  K 3.5* 3.7  --  4.1  CL 96 94*  --  92*  CO2 23 21  --  21  GLUCOSE 174* 143*  --  125*  BUN 31* 32*  --  40*  CREATININE 1.22 1.22  --  1.24  CALCIUM 7.7* 7.7*  --  7.7*  MG 1.5  --  1.9  --    Liver Function Tests:  Recent Labs Lab 03/23/14 1810  AST 33  ALT 21  ALKPHOS 124*  BILITOT 0.5  PROT 7.3  ALBUMIN 2.8*   No results found for this basename: LIPASE, AMYLASE,  in the last 168 hours No results found for this basename: AMMONIA,  in the last 168 hours CBC:  Recent Labs Lab 03/23/14 1045 03/26/14 1825  WBC 5.8 10.9*  HGB 10.6* 10.4*  HCT 30.9* 31.3*  MCV 88.0 87.7  PLT 313 448*   Cardiac Enzymes: No results found for this basename: CKTOTAL, CKMB, CKMBINDEX, TROPONINI,  in the last 168 hours BNP:  Recent Labs Lab 03/23/14 1045  PROBNP 6297.0*   D-Dimer: No results found for this basename: DDIMER,  in the last 168 hours CBG:  Recent Labs Lab 03/25/14 2103 03/26/14 0747 03/26/14 1207  03/26/14 1645 03/26/14 2211 03/27/14 0801  GLUCAP 195* 140* 165* 115* 167* 96   Micro Results: Recent Results (from the past 240 hour(s))  CULTURE, BLOOD (ROUTINE X 2)     Status: None   Collection Time    03/23/14  2:24 PM      Result Value Ref Range Status   Specimen Description BLOOD RIGHT ARM   Final   Special Requests BOTTLES DRAWN AEROBIC AND ANAEROBIC 5ML   Final   Culture  Setup Time     Final   Value: 03/23/2014 22:01     Performed at [facility]   Culture     Final   Value:        BLOOD CULTURE RECEIVED NO GROWTH TO DATE CULTURE WILL BE HELD FOR 5 DAYS BEFORE ISSUING A FINAL NEGATIVE REPORT     Performed at [facility]   Report Status PENDING   Incomplete  CULTURE, BLOOD (ROUTINE X 2)     Status: None   Collection Time    03/23/14  2:36 PM  Result Value Ref Range Status   Specimen Description BLOOD ARM LEFT   Final   Special Requests BOTTLES DRAWN AEROBIC AND ANAEROBIC [facility]   Final   Culture  Setup Time     Final   Value: 03/23/2014 22:01     Performed at [facility]   Culture     Final   Value:        BLOOD CULTURE RECEIVED NO GROWTH TO DATE CULTURE WILL BE HELD FOR 5 DAYS BEFORE ISSUING A FINAL NEGATIVE REPORT     Performed at [facility]   Report Status PENDING   Incomplete   Studies/Results: No results found. Medications: I have reviewed the patient's current medications. Scheduled Meds: . aspirin EC  81 mg Oral Daily  . carvedilol  6.25 mg Oral BID WC  . doxycycline  100 mg Oral Q12H  . enoxaparin (LOVENOX) injection  40 mg Subcutaneous Q24H  . feeding supplement (RESOURCE BREEZE)  1 Container Oral TID BM  . folic acid  1 mg Oral Daily  . insulin aspart  0-15 Units Subcutaneous TID WC  . insulin aspart  0-5 Units Subcutaneous QHS  . ipratropium-albuterol  3 mL Nebulization TID  . lisinopril  10 mg Oral Daily  . magnesium oxide  400 mg Oral BID  . multivitamin with minerals  1 tablet Oral Daily  . predniSONE  40  mg Oral Q breakfast  . sodium chloride  3 mL Intravenous Q12H  . sodium chloride  3 mL Intravenous Q12H  . thiamine  100 mg Oral Daily  . tuberculin  5 Units Intradermal Once   Continuous Infusions: . sodium chloride 1 mL/kg/hr (03/27/14 0427)   PRN Meds:.sodium chloride, acetaminophen, acetaminophen, albuterol, sodium chloride Assessment/Plan:   Acute exacerbation of chronic obstructive pulmonary disease (COPD) -  O2 sat remains >94 % on room air -  prednisone 40mg  daily x 5 days - Doxycycline 100mg  BID treat for 5 days - Duoneb treatment TID scheduled - Albuterol Q4prn -  Pulmonology has seen patient, patient recommends PET scan, suspect current dyspnea due to COPD.   - Follow up with Dr.Clance 7/9 - Follow up with new PCP 7/6 - PPD placed 6/22, Quantiferon Gold pending  Acute Systolic Congestive Heart Failure - Elevated ProBNP of 6k on admission.  Echo shows Ef 20-25%, Cardiology consulted and patient to go for cardiac cath this morning - Pending results of cath patient may need further diuresis>>> Cath results LVEDP 49mmHg no evidence of ischemic cause. - Lisinopril 10mg  daily - Coreg 6.25mg  BID     Hypokalemia with Hypomagnesemia (resolved) - Improved to 4.1 mEq/L this am.  Mag 1.9 last PM - Continue KDur at 15mEq daily - Continue Magox 400mg  BID - AM BMP  QT Prolongation -Noted on EKG, likely due to hypoK and hypomag.   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  He does have outpatient PET[facility] at [facility] for 6/26. - Patient has follow up with [facility] on 6/29.    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  Cr has been stable at 1.2 - Patient started on Lisinopril 10mg  daily  Impaired fasting glucose - A1c 6.2.   Educated on diet and exercise. - SSI while on  Steroids  Hyponatremia - May be due to CHF, although patient does not appear significantly volume overloaded. - Follow up cath results patient may need diuresis - Trend BMP  DVT PPx: Heparin [place] Diet: NPO, heart healthy after cath Code: Full Dispo: Pending cardiac cath possible discharge 6/24  The patient does not have a current PCP (No Pcp Per Patient) and does need an [facility] hospital follow-up appointment after discharge.  The patient does not have transportation limitations that hinder transportation to clinic appointments.  .Services Needed at time of discharge: Y = Yes, Blank = No PT:   OT:   RN:   Equipment:   Other:     LOS: 4 days   Joni Reining, DO 03/27/2014, 8:43 AM

## 2014-03-27 NOTE — Progress Notes (Signed)
Report given to [facility] RN for transfer from [phone]

## 2014-03-27 NOTE — Progress Notes (Signed)
Bed alarm on per protocol for fall risk.  Pt angry when alarm went off because he wants to be up in room, states they let him "go wherever he wanted on the other floor."  Explained to patient of risk for fall due to sedation in cath lab and risk for bleeding to groin.  Fall risk sheet given to patient.  Pt calmer after explanation but refuses to let staff use bed alarm.  Pt a&o x4, ambulated to bathroom <10, denies dizziness, no unsteadiness noted. Bed alarm off, door open for observation, call light in reach.  RN asked pt to please call for standby assist when getting up.  CIWA scale 1, see flowsheet.  Mild SOB at rest, lungs diminished w/ audible exp wheezes.  Just received neb tx by RT.  99% on RA, pt refuses to wear continuous O2 sat for monitoring during sleep.  Took pills without difficulty.  Discussed importance of smoking cessation, tips for success sheet given to patient.  Pt states he just quit smoking this month and "had to give it up unless I want cancer again."  Emotional reassurance provided, pt now pleasant and calm.

## 2014-03-27 NOTE — Plan of Care (Signed)
Problem: Phase II Progression Outcomes Goal: Case manager referral Outcome: Progressing Care management referral initiated per protocol for EF<40% for RN follow-up after discharge.

## 2014-03-27 NOTE — Interval H&P Note (Signed)
Cath Lab Visit (complete for each Cath Lab visit)  Clinical Evaluation Leading to the Procedure:   ACS: yes  Non-ACS:    Anginal Classification: CCS II  Anti-ischemic medical therapy: Minimal Therapy (1 class of medications)  Non-Invasive Test Results: High-risk stress test findings: cardiac mortality >3%/year  Prior CABG: No previous CABG      History and Physical Interval Note:  03/27/2014 10:47 AM  Julian Zimmerman  has presented today for surgery, with the diagnosis of cp  The various methods of treatment have been discussed with the patient and family. After consideration of risks, benefits and other options for treatment, the patient has consented to  Procedure(s): LEFT AND RIGHT HEART CATHETERIZATION WITH CORONARY ANGIOGRAM (N/A) as a surgical intervention .  The patient's history has been reviewed, patient examined, no change in status, stable for surgery.  I have reviewed the patient's chart and labs.  Questions were answered to the patient's satisfaction.     VARANASI,JAYADEEP S.

## 2014-03-27 NOTE — H&P (View-Only) (Signed)
    Subjective:  Denies CP; SOB with exertion   Objective:  Filed Vitals:   03/26/14 2108 03/26/14 2147 03/27/14 0705 03/27/14 0935  BP:  120/81 144/99 124/86  Pulse:  95 88 86  Temp:  97.5 F (36.4 C)  97 F (36.1 C)  TempSrc:  Axillary Axillary Axillary  Resp:      Height:      Weight:      SpO2: 95% 95% 94% 93%    Intake/Output from previous day:  Intake/Output Summary (Last 24 hours) at 03/27/14 1014 Last data filed at 03/27/14 0843  Gross per 24 hour  Intake 302.95 ml  Output      0 ml  Net 302.95 ml    Physical Exam: Physical exam: Well-developed well-nourished in no acute distress.  Skin is warm and dry.  HEENT is normal.  Neck is supple.  Chest with diminished BS throughout Cardiovascular exam is regular rate and rhythm.  Abdominal exam nontender or distended. No masses palpated. Extremities show 1+ edema. neuro grossly intact    Lab Results: Basic Metabolic Panel:  Recent Labs  03/25/14 2018 03/26/14 0409 03/26/14 1022 03/27/14 0402  NA 137 135*  --  132*  K 3.5* 3.7  --  4.1  CL 96 94*  --  92*  CO2 23 21  --  21  GLUCOSE 174* 143*  --  125*  BUN 31* 32*  --  40*  CREATININE 1.22 1.22  --  1.24  CALCIUM 7.7* 7.7*  --  7.7*  MG 1.5  --  1.9  --    CBC:  Recent Labs  03/26/14 1825  WBC 10.9*  HGB 10.4*  HCT 31.3*  MCV 87.7  PLT 448*     Assessment/Plan:  1 CM-plan for R and L cath today; risks and benefits discussed and patient agrees to proceed. Check TSH; continue ASA, ACEI and beta blocker. Add diuretic depending on results of RHC. 2 Dyspnea-most likely multifactoral including CHF but also with severe COPD on exam. 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 4 Renal insuff-follow renal function following procedure.  Kirk Ruths 03/27/2014, 10:14 AM

## 2014-03-28 DIAGNOSIS — E43 Unspecified severe protein-calorie malnutrition: Secondary | ICD-10-CM

## 2014-03-28 DIAGNOSIS — R3129 Other microscopic hematuria: Secondary | ICD-10-CM

## 2014-03-28 DIAGNOSIS — I428 Other cardiomyopathies: Secondary | ICD-10-CM

## 2014-03-28 LAB — BASIC METABOLIC PANEL
BUN: 49 mg/dL — AB (ref 6–23)
CO2: 20 mEq/L (ref 19–32)
Calcium: 8 mg/dL — ABNORMAL LOW (ref 8.4–10.5)
Chloride: 93 mEq/L — ABNORMAL LOW (ref 96–112)
Creatinine, Ser: 1.37 mg/dL — ABNORMAL HIGH (ref 0.50–1.35)
GFR calc Af Amer: 60 mL/min — ABNORMAL LOW (ref >90)
GFR calc non Af Amer: 52 mL/min — ABNORMAL LOW (ref >90)
GLUCOSE: 119 mg/dL — AB (ref 70–99)
POTASSIUM: 5.1 meq/L (ref 3.7–5.3)
Sodium: 133 mEq/L — ABNORMAL LOW (ref 137–147)

## 2014-03-28 LAB — GLUCOSE, CAPILLARY: GLUCOSE-CAPILLARY: 118 mg/dL — AB (ref 70–99)

## 2014-03-28 MED ORDER — ALBUTEROL SULFATE (2.5 MG/3ML) 0.083% IN NEBU: 2.5000 mg | INHALATION_SOLUTION | RESPIRATORY_TRACT | Status: DC | PRN

## 2014-03-28 MED ORDER — ISOSORBIDE MONONITRATE ER 30 MG PO TB24
30.0000 mg | ORAL_TABLET | Freq: Every day | ORAL | Status: DC
Start: 2014-03-28 — End: 2014-03-28
  Administered 2014-03-28: 30 mg via ORAL
  Filled 2014-03-28: qty 1

## 2014-03-28 MED ORDER — HYDRALAZINE HCL 10 MG PO TABS
10.0000 mg | ORAL_TABLET | Freq: Three times a day (TID) | ORAL | Status: DC
  Filled 2014-03-28 (×3): qty 1

## 2014-03-28 MED ORDER — ISOSORBIDE MONONITRATE ER 30 MG PO TB24: 30.0000 mg | ORAL_TABLET | Freq: Every day | ORAL | Status: DC

## 2014-03-28 MED ORDER — ASPIRIN 81 MG PO TBEC: 81.0000 mg | DELAYED_RELEASE_TABLET | Freq: Every day | ORAL | Status: AC

## 2014-03-28 MED ORDER — ATORVASTATIN CALCIUM 40 MG PO TABS: 40.0000 mg | ORAL_TABLET | Freq: Every day | ORAL | Status: DC

## 2014-03-28 MED ORDER — ATORVASTATIN CALCIUM 40 MG PO TABS
40.0000 mg | ORAL_TABLET | Freq: Every day | ORAL | Status: DC
  Filled 2014-03-28: qty 1

## 2014-03-28 MED ORDER — HYDRALAZINE HCL 10 MG PO TABS: 10.0000 mg | ORAL_TABLET | Freq: Three times a day (TID) | ORAL | Status: DC

## 2014-03-28 MED ORDER — CARVEDILOL 6.25 MG PO TABS: 6.2500 mg | ORAL_TABLET | Freq: Two times a day (BID) | ORAL | Status: DC

## 2014-03-28 NOTE — Progress Notes (Signed)
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 No CP or SOB.   CURRENT MEDS . aspirin EC  81 mg Oral Daily  . carvedilol  6.25 mg Oral BID WC  . enoxaparin (LOVENOX) injection  40 mg Subcutaneous Q24H  . feeding supplement (RESOURCE BREEZE)  1 Container Oral TID BM  . folic acid  1 mg Oral Daily  . insulin aspart  0-15 Units Subcutaneous TID WC  . insulin aspart  0-5 Units Subcutaneous QHS  . ipratropium-albuterol  3 mL Nebulization TID  . lisinopril  10 mg Oral Daily  . magnesium oxide  400 mg Oral BID  . multivitamin with minerals  1 tablet Oral Daily  . predniSONE  40 mg Oral Q breakfast  . sodium chloride  3 mL Intravenous Q12H  . thiamine  100 mg Oral Daily  . tuberculin  5 Units Intradermal Once    OBJECTIVE  Filed Vitals:   03/27/14 2016 03/27/14 2051 03/28/14 0014 03/28/14 0618  BP:  118/78 124/81 162/97  Pulse:  85 87 90  Temp:  97.4 F (36.3 C) 97.7 F (36.5 C) 97.3 F (36.3 C)  TempSrc:  Axillary Axillary Oral  Resp:  14 17 15   Height:      Weight:   179 lb 3.7 oz (81.3 kg)   SpO2: 95% 99%  95%    Intake/Output Summary (Last 24 hours) at 03/28/14 0628 Last data filed at 03/28/14 0258  Gross per 24 hour  Intake 639.95 ml  Output    475 ml  Net 164.95 ml   Filed Weights   03/23/14 1528 03/26/14 1949 03/28/14 0014  Weight: 154 lb 15.7 oz (70.3 kg) 177 lb 0.5 oz (80.3 kg) 179 lb 3.7 oz (81.3 kg)    PHYSICAL  EXAM  General: Pleasant, NAD. Neuro: Alert and oriented X 3. Moves all extremities spontaneously. Psych: Normal affect. HEENT:  Normal  Neck: Supple without bruits or JVD. Lungs:  Resp regular and unlabored, CTA. Prolonged expiratory phase Heart: RRR no s3, s4, or murmurs. Abdomen: Soft, non-tender, non-distended, BS + x 4.  Extremities: No clubbing, cyanosis or edema. DP/PT/Radials 2+ and equal bilaterally. 2+ pitting edema in LE. Groin site.   Accessory Clinical Findings  CBC  Recent Labs  03/26/14 1825  WBC 10.9*  HGB 10.4*  HCT 31.3*  MCV 87.7  PLT 527*   Basic Metabolic Panel  Recent Labs  03/25/14 2018 03/26/14 0409 03/26/14 1022 03/27/14 0402  NA 137 135*  --  132*  K 3.5* 3.7  --  4.1  CL 96 94*  --  92*  CO2 23 21  --  21  GLUCOSE 174* 143*  --  125*  BUN 31* 32*  --  40*  CREATININE 1.22 1.22  --  1.24  CALCIUM 7.7* 7.7*  --  7.7*  MG 1.5  --  1.9  --     Fasting  Lipid Panel  Recent Labs  03/26/14 1713  CHOL 228*  HDL 70  LDLCALC 142*  TRIG 78  CHOLHDL 3.3    TELE  NSR with few PVCs  Radiology/Studies  Dg Orthopantogram  03/14/2014   CLINICAL DATA:  Tongue pain.  EXAM: ORTHOPANTOGRAM/PANORAMIC  COMPARISON:  MRI brain 11/29/2007.  FINDINGS: Patient is partially edentulous. Lucency noted along the alveolar surface of the inferior right mandibular ramus. This could be from recent tooth extraction. Focal infection cannot be excluded. No evidence of fracture.  IMPRESSION: Lucency noted along the anterior alveolar surface of the inferior right mandibular ramus. This could be from recent tooth extraction. Focal infection cannot be excluded .   Electronically Signed   By: Marcello Moores  Register   On: 03/14/2014 09:05   Dg Chest 2 View (if Patient Has Fever And/or Copd)  03/23/2014   CLINICAL DATA:  Shortness of breath  EXAM: CHEST  2 VIEW  COMPARISON:  PA and lateral chest x-ray of April 04, 2004.  FINDINGS: There are confluent masslike parenchymal  densities in the right and left mid lung. The cardiac silhouette is normal in size. The pulmonary vascularity is not engorged. Small amounts of pleural fluid blunt the costophrenic angles laterally and thickened the fissures. The bony thorax is unremarkable.  IMPRESSION: Abnormal bilateral pulmonary parenchymal densities are suspicious for masses which may be infectious or neoplastic. Chest CT scanning now is recommended.   Electronically Signed   By: David  Martinique   On: 03/23/2014 10:53   Ct Soft Tissue Neck W Contrast  03/14/2014   CLINICAL DATA:  Mass at the floor of the mouth.  EXAM: CT NECK WITH CONTRAST  TECHNIQUE: Multidetector CT imaging of the neck was performed using the standard protocol following the bolus administration of intravenous contrast.  CONTRAST:  44mL OMNIPAQUE IOHEXOL 300 MG/ML  SOLN  COMPARISON:  MRI brain 11/29/2007.  Panorex 03/14/2008.  FINDINGS: Lung apices show emphysema. There is pleural thickening of the major fissure on the left. No active process is evident.  Both parotid glands are normal. Both submandibular glands are normal. The thyroid gland is normal.  There is a mass at the floor of the mouth on the right involving the sublingual space without evidence of extension into the submandibular space. Fat planes are indistinct. There is probably involvement of the intrinsic musculature. Exact size cannot be established by CT, but this would be estimated to be on the order of 3 cm in diameter. The differential diagnosis is squamous cell carcinoma versus sublingual gland adenocarcinoma. There is no evidence of bone invasion of the mandible.  There is some asymmetry of the level 1 lymph nodes when comparing right to left. The largest level 1 lymph node measures 8 x 12 mm in diameter on the right as opposed to the left-sided node that measures 7 x 10 mm. A second adjacent level 1 node measures 7 x 8 mm as opposed to the left-sided node that measures 4 x 5 mm. Therefore, these  right-sided level 1 nodes are suspicious for metastatic involvement.  No abnormal lymph nodes lower in the neck. No vascular lesion. No other mucosal abnormality lower in the neck. There is ordinary degenerative spondylosis of the cervical spine.  IMPRESSION: Ill-defined mass in the sublingual space on the right consistent with malignancy. This is difficult to measure precisely but is estimated at 3 cm in diameter. The differential diagnosis is squamous cell carcinoma of the floor of the mouth versus is adenocarcinoma of the sublingual  gland. Asymmetric level 1 lymph nodes on the right are suspicious for metastatic involvement.   Electronically Signed   By: Nelson Chimes M.D.   On: 03/14/2014 10:58   Ct Angio Chest W/cm &/or Wo Cm  03/23/2014   CLINICAL DATA:  Shortness of breath, possible lung cancer - evaluate for PE  EXAM: CT ANGIOGRAPHY CHEST WITH CONTRAST  TECHNIQUE: Multidetector CT imaging of the chest was performed using the standard protocol during bolus administration of intravenous contrast. Multiplanar CT image reconstructions and MIPs were obtained to evaluate the vascular anatomy.  CONTRAST:  170mL OMNIPAQUE IOHEXOL 350 MG/ML SOLN  COMPARISON:  Chest x-ray obtained earlier today  FINDINGS: Mediastinum: Unremarkable CT appearance of the thyroid gland. Borderline mediastinal adenopathy. A prevascular lymph node measures 9 mm in short axis (image 35, series 4). Slightly enlarged right paratracheal node measures 1.3 cm in short axis (image 26, series 4). Unremarkable appearance of the esophagus.  Heart/Vascular: Adequate opacification of the pulmonary arteries to the proximal subsegmental level. No evidence of central filling defect to suggest acute pulmonary embolus. Atherosclerotic calcifications present within the coronary arteries. Borderline cardiomegaly with left ventricular dilatation. Reflux of contrast material into the hepatic veins noted incidentally.  Lungs/Pleura: Small right pleural  effusion versus pleural thickening. There is thickening calcified pleural plaque medially adjacent to the right lower lobe as well as a mild pleural calcification inferiorly overlying the hepatic dome. Extensive nodular low-attenuation implants present throughout the subpleural space, particularly within the pulmonary fissures. Exact measurement of the abnormalities is a difficult given that many of the in nodules are confluent and connect through the subpleural space. In the superior aspect of the right major fissure, an index dumbbell-shaped lesion measures 6.1 x 3.6 cm. On the left, in the major fissure a second dumbbell-shaped lesion measures 5.9 by 3.0 cm (image 58, series 4). Moderate centrilobular emphysema is present throughout the upper lungs. Respiratory motion limits evaluation for small pulmonary nodules. There is a single 1.6 x 1.3 cm nodular opacity in the left lower lobe (image 70, series 6). The margins of this nodule are somewhat hazy and ill-defined. It is unclear if this is secondary to ground-glass attenuation as can be seen in the setting of infection/ inflammation, or due to superimpose respiratory motion. There is diffuse bronchial wall thickening throughout the lungs.  Bones/Soft Tissues: No acute fracture or aggressive appearing lytic or blastic osseous lesion. No acute fracture or aggressive appearing lytic or blastic osseous lesion. Multilevel degenerative spurring throughout the thoracic spine.  Upper Abdomen: Punctate calcifications present throughout the spleen and liver suggesting old granulomatous disease. The images are mildly degraded by motion artifact. A 9 mm low-attenuation lesion in the upper pole of the right kidney is incompletely characterized but statistically likely a benign cyst.  Review of the MIP images confirms the above findings.  IMPRESSION: 1. Negative for acute pulmonary embolus. 2. The numerous densities seen on the prior chest x-ray correspond with subpleural  low-attenuation (water) masses scattered throughout the pleural surfaces of the pulmonary fissures indicative of a process. Given the overall distribution and borderline mediastinal lymphadenopathy, an atypical appearance of sarcoidosis is favored. However, the imaging appearance is not definitive in this example. Additional differential considerations given the very low attenuation of the masses include multi loculated pleural fluid, multiple bilateral pleural metastases from an unknown primary malignancy (possibly a mucinous adenocarcinoma given the low-attenuation), and less likely a primary pleural malignancy such as mesothelioma. Mesothelioma is considered less likely given the bilaterality of the  masses and the unilateral distribution of calcified pleural plaques. 3. There is a solitary somewhat ill-defined and hazy 1.6 x 1.3 cm nodular opacity in the left lower lobe which may represent a focus of active infection/inflammation or a true pulmonary nodule. Overall, I would recommend further evaluation with PET-CT to evaluate for hypermetabolic activity in both the pleural lesions, and the left lower lobe nodule. If all lesions are hypermetabolic, than CT-guided biopsy would be warranted. If the left lower lobe pulmonary nodule is intensely hypermetabolic, CT-guided biopsy would be warranted. If, however the left lower lobe pulmonary nodule is only mildly hypermetabolic then both repeat imaging following an appropriate course of antimicrobial therapy or CT-guided biopsy would be reasonable approaches. 4. Nonspecific mediastinal adenopathy may be reactive / granulomatous or metastatic. 5. 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 6. Calcified pleural plaques in the medial and inferior aspect of the right hemi thorax. Given the unilaterality, this likely represents the sequelae of a prior infectious/inflammatory process or sequelae of remote hemothorax. Asbestos related pleural disease  is less likely. 7. Cardiomegaly with left ventricular dilatation. 8. Atherosclerosis including coronary artery disease. 9. Sequelae of old granulomatous disease involving the spleen and liver.   Electronically Signed   By: Jacqulynn Cadet M.D.   On: 03/23/2014 13:21    ASSESSMENT AND PLAN Julian Zimmerman is a 68 y.o. male with no prior medical care and a history of tobacco/alcohol abuse, recently diagnosed oral SCC who presented to [facility] on 03/23/14 with progressive SOB on exertion progressing over the past 2 weeks. He was found to be hypokalemic with hypomagnesemia. A chest CT was done to rule out pulmonary embolism and showed abnormalities concerning for metastases. A PET scan was recommended. A echocardiogram was performed which revealed a newly reduced ejection fraction of 20-25% and cardiology was consulted.  Non Ischemic CM-- ECHO with EF 20-25% s/p L/RHC yesterday.              Widely patent left main coronary artery.  Moderate disease in the LAD , including a focal 50% mid LAD lesion- not significant by FFR.  Small, patent left circumflex artery . Moderate disease in the ramus vessel.  Mild disease in a dominant right coronary artery.  Left ventricular systolic function not assessed. LVEDP 30 mmHg.  -- His cardiomyopathy diagnosed by echocardiogram is not explained by CAD. Continue aggressive medical therapy to help improve left ventricular function. -- Continue ASA and beta blocker.  -- Will follow with Dr. Johnsie Cancel in office.    CAD- non obstructive- continue BB and ASA. Will need to add on a statin (lipid panel with LDL 142). Added on atorvastatin 40mg .   Dyspnea-most likely multifactoral including CHF but also with severe COPD on exam.   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   Renal insuff  Tobacco abuse- quit ~1 month ago when he was diagnosed with oral cancer  Dispo-  He will be followed by Dr. Johnsie Cancel; I have scheduled an appointment with an  APP on July 1st.    Signed, Perry Mount PA-C  Pager [phone] As above, patient seen and examined. Patient states his dyspnea has improved. No chest pain. Cardiac catheterization shows nonobstructive coronary disease; LVEDP elevated. Cardiomyopathy is nonischemic. His renal function continues to deteriorate. I wonder if there may be a component of contrast nephropathy related to his CTA on admission. He then received further dye yesterday with cardiac catheterization. I will discontinue his ACE inhibitor  for now. Add hydralazine/nitrate. Continue beta blocker. ACE inhibitor can be resumed as an outpatient if his renal function improves. He needs diuresis given elevated LVEDP. However I will not add Lasix today because of worsening renal insufficiency. I would favor keeping in house and repeating potassium and renal function tomorrow. He will followup with Dr. Johnsie Cancel following discharge. Following titration of medications echocardiogram should be repeated to reassess LV function. Kirk Ruths Patient states he will not stay in the hospital. If he is not agreeable to remain hospitalized I would recommend checking his potassium and renal function tomorrow with further management based on results. He will ultimately need addition of diuretic. I did recommend that he remain in house. Kirk Ruths

## 2014-03-28 NOTE — Progress Notes (Signed)
Patient ambulated in hall independently with steady gait. Oxygenation monitored with oximetry and noted to have O2 sats at 87% during ambulation. Noted to be short of breath after ambulation, resolved with rest.

## 2014-03-28 NOTE — Discharge Instructions (Signed)
Please pick up your medications. Please use your albuterol inhaler every 6 hours as needed for wheezing or shortness of breath. (Can use nebulizer or inhaler) Please use Spirivia every day (for COPD) Please take Coreg 6.25mg  twice a day (for your heart) Please take Atorvastatin (Lipitor) 40mg  every night (for your heart) Please take Hydralazine 10mg  three times a day (for blood pressure) Please take IMDUR 30mg  once a day (for heart and blood pressure)  Please come to the [facility] on the ground floor of the hospital TOMORROW 03/29/14 to have repeat blood work checked, I will call with these results.  Please arrive before 4pm.  You will need to see [facility] Dr. Gwenette Greet (lung doctor) on 04/12/14.  I have scheduled you the first available appointment and [facility], your appointment is on 04/09/14 please bring your medications to this appointment.   Groin Site Care Refer to this sheet in the next few weeks. These instructions provide you with information on caring for yourself after your procedure. Your caregiver may also give you more specific instructions. Your treatment has been planned according to current medical practices, but problems sometimes occur. Call your caregiver if you have any problems or questions after your procedure. HOME CARE INSTRUCTIONS  You may shower 24 hours after the procedure. Remove the bandage (dressing) and gently wash the site with plain soap and water. Gently pat the site dry.   Do not apply powder or lotion to the site.   Do not sit in a bathtub, swimming pool, or whirlpool for 5 to 7 days.   No bending, squatting, or lifting anything over 10 pounds (4.5 kg) as directed by your caregiver.   Inspect the site at least twice daily.   Do not drive home if you are discharged the same day of the procedure. Have someone else drive you.   You may drive 24 hours after the procedure unless otherwise instructed by your caregiver.    What to expect:  Any bruising will usually fade within 1 to 2 weeks.   Blood that collects in the tissue (hematoma) may be painful to the touch. It should usually decrease in size and tenderness within 1 to 2 weeks.  SEEK IMMEDIATE MEDICAL CARE IF:  You have unusual pain at the groin site or down the affected leg.   You have redness, warmth, swelling, or pain at the groin site.   You have drainage (other than a small amount of blood on the dressing).   You have chills.   You have a fever or persistent symptoms for more than 72 hours.   You have a fever and your symptoms suddenly get worse.   Your leg becomes pale, cool, tingly, or numb.  You have heavy bleeding from the site. Hold pressure on the site. [name]

## 2014-03-28 NOTE — Progress Notes (Signed)
TB test site left mid forearm assessed: Redness noted at site, 37mm induration, no swelling noted with palpation, area soft at site and around area. Patient verbalized no symptomatic symptoms. Assessed at 1200, 03/28/2014 by Dina Rich RN.

## 2014-03-28 NOTE — Progress Notes (Signed)
    Day 5 of stay      Patient name: Julian Zimmerman  Medical record number: [id number]  Date of birth: 12-Oct-1945   The patient is doing okay post cath. He denies any wheezing, chest pain or dizziness. No palpitations, improved breathing. I have read Dr Jacalyn Lefevre note that he would like to observe the patient in house, and I agree, however the patient is adamant that he wants to go home. The patient has a very motivated caregiver, his wife, and she would be a good resource to help him in following up at clinic tomorrow. The patient was presented the option of staying in the hospital for one more day by me this morning (his wife was not present at the time), however, he refuses, despite being told the complications and risks. I will recommend to my team to discuss the options with his wife present as well, and if he still does not agree to stay then we already have a follow up set up for him in [facility] for tomorrow. I agree with holding ACEi, and lasix for now. They can be started as outpatient. The patient has a PET scan scheduled for later this week, and also has an appointment with his ENT surgeon coming up soon.   I have discussed the care of this patient with my IM team residents. Please see the resident note for details.  Madilyn Fireman 03/28/2014, 12:45 PM.

## 2014-03-28 NOTE — Progress Notes (Signed)
Pt awoke disoriented, taking clothes and tele mx off, states he's "ready to go".  Reoriented quickly to place and time.  Up in room, steady on feet.  Rt groin level 0.

## 2014-03-28 NOTE — Progress Notes (Signed)
Subjective: Patient doing well post cath, has no acute complaints.  He reports his breathing has improved greatly and he is ready to go home.  He reports that he does not want to have to wear oxygen at home if this can be avoided. Objective: Vital signs in last 24 hours: Filed Vitals:   03/27/14 2016 03/27/14 2051 03/28/14 0014 03/28/14 0618  BP:  118/78 124/81 162/97  Pulse:  85 87 90  Temp:  97.4 F (36.3 C) 97.7 F (36.5 C) 97.3 F (36.3 C)  TempSrc:  Axillary Axillary Oral  Resp:  14 17 15   Height:      Weight:   179 lb 3.7 oz (81.3 kg)   SpO2: 95% 99%  95%   Weight change: 2 lb 3.3 oz (1 kg)  Intake/Output Summary (Last 24 hours) at 03/28/14 1006 Last data filed at 03/28/14 3382  Gross per 24 hour  Intake    340 ml  Output    475 ml  Net   -135 ml    Lab Results: Basic Metabolic Panel:  Recent Labs Lab 03/25/14 2018  03/26/14 1022 03/27/14 0402 03/28/14 0402  NA 137  < >  --  132* 133*  K 3.5*  < >  --  4.1 5.1  CL 96  < >  --  92* 93*  CO2 23  < >  --  21 20  GLUCOSE 174*  < >  --  125* 119*  BUN 31*  < >  --  40* 49*  CREATININE 1.22  < >  --  1.24 1.37*  CALCIUM 7.7*  < >  --  7.7* 8.0*  MG 1.5  --  1.9  --   --   < > = values in this interval not displayed. Liver Function Tests:  Recent Labs Lab 03/23/14 1810  AST 33  ALT 21  ALKPHOS 124*  BILITOT 0.5  PROT 7.3  ALBUMIN 2.8*   No results found for this basename: LIPASE, AMYLASE,  in the last 168 hours No results found for this basename: AMMONIA,  in the last 168 hours CBC:  Recent Labs Lab 03/23/14 1045 03/26/14 1825  WBC 5.8 10.9*  HGB 10.6* 10.4*  HCT 30.9* 31.3*  MCV 88.0 87.7  PLT 313 448*   Cardiac Enzymes: No results found for this basename: CKTOTAL, CKMB, CKMBINDEX, TROPONINI,  in the last 168 hours BNP:  Recent Labs Lab 03/23/14 1045  PROBNP 6297.0*   D-Dimer: No results found for this basename: DDIMER,  in the last 168 hours CBG:  Recent Labs Lab  03/26/14 1645 03/26/14 2211 03/27/14 0801 03/27/14 1733 03/27/14 2226 03/28/14 0804  GLUCAP 115* 167* 96 152* 130* 118*   Hemoglobin A1C:  Recent Labs Lab 03/25/14 0448  HGBA1C 6.2*   Fasting Lipid Panel:  Recent Labs Lab 03/26/14 1713  CHOL 228*  HDL 70  LDLCALC 142*  TRIG 78  CHOLHDL 3.3   Thyroid Function Tests: No results found for this basename: TSH, T4TOTAL, FREET4, T3FREE, THYROIDAB,  in the last 168 hours Coagulation:  Recent Labs Lab 03/26/14 1825  LABPROT 14.5  INR 1.15   Anemia Panel: No results found for this basename: VITAMINB12, FOLATE, FERRITIN, TIBC, IRON, RETICCTPCT,  in the last 168 hours Urine Drug Screen: Drugs of Abuse  No results found for this basename: labopia, cocainscrnur, labbenz, amphetmu, thcu, labbarb    Alcohol Level: No results found for this basename: ETH,  in the last 168 hours Urinalysis:  Recent Labs Lab 03/26/14 1117  COLORURINE YELLOW  LABSPEC 1.020  PHURINE 5.0  GLUCOSEU NEGATIVE  HGBUR MODERATE*  BILIRUBINUR NEGATIVE  KETONESUR NEGATIVE  PROTEINUR 100*  UROBILINOGEN 0.2  NITRITE NEGATIVE  LEUKOCYTESUR NEGATIVE    Micro Results: Recent Results (from the past 240 hour(s))  CULTURE, BLOOD (ROUTINE X 2)     Status: None   Collection Time    03/23/14  2:24 PM      Result Value Ref Range Status   Specimen Description BLOOD RIGHT ARM   Final   Special Requests BOTTLES DRAWN AEROBIC AND ANAEROBIC 5ML   Final   Culture  Setup Time     Final   Value: 03/23/2014 22:01     Performed at [facility]   Culture     Final   Value:        BLOOD CULTURE RECEIVED NO GROWTH TO DATE CULTURE WILL BE HELD FOR 5 DAYS BEFORE ISSUING A FINAL NEGATIVE REPORT     Performed at [facility]   Report Status PENDING   Incomplete  CULTURE, BLOOD (ROUTINE X 2)     Status: None   Collection Time    03/23/14  2:36 PM      Result Value Ref Range Status   Specimen Description BLOOD ARM LEFT   Final   Special Requests  BOTTLES DRAWN AEROBIC AND ANAEROBIC 6CC   Final   Culture  Setup Time     Final   Value: 03/23/2014 22:01     Performed at [facility]   Culture     Final   Value:        BLOOD CULTURE RECEIVED NO GROWTH TO DATE CULTURE WILL BE HELD FOR 5 DAYS BEFORE ISSUING A FINAL NEGATIVE REPORT     Performed at [facility]   Report Status PENDING   Incomplete   Studies/Results: No results found. Medications: I have reviewed the patient's current medications. Scheduled Meds: . aspirin EC  81 mg Oral Daily  . atorvastatin  40 mg Oral q1800  . carvedilol  6.25 mg Oral BID WC  . enoxaparin (LOVENOX) injection  40 mg Subcutaneous Q24H  . feeding supplement (RESOURCE BREEZE)  1 Container Oral TID BM  . folic acid  1 mg Oral Daily  . insulin aspart  0-15 Units Subcutaneous TID WC  . insulin aspart  0-5 Units Subcutaneous QHS  . ipratropium-albuterol  3 mL Nebulization TID  . lisinopril  10 mg Oral Daily  . magnesium oxide  400 mg Oral BID  . multivitamin with minerals  1 tablet Oral Daily  . predniSONE  40 mg Oral Q breakfast  . sodium chloride  3 mL Intravenous Q12H  . thiamine  100 mg Oral Daily  . tuberculin  5 Units Intradermal Once   Continuous Infusions:  PRN Meds:.acetaminophen, acetaminophen, acetaminophen, albuterol, ondansetron (ZOFRAN) IV Assessment/Plan:   Acute exacerbation of chronic obstructive pulmonary disease (COPD) - Patient without wheezing on exam O2 seems to have remained 95-99% on room air overnight.  Patinet will finish steroid and doxycycline course today.  He does not wish to have home O2.  I have requested that he walk again with nursing to record O2 sat to see if he still should have it - Ambulate with nursing to reestablish O2 necessities  - Continue Spirivia and PRN albuterol as outaptient - F/U with Pulm on 7/9 - Will need formal PFTs - O2 sat remain 87% with ambulation patient agreeable  to home oxygen    Decreased cardiac ejection fraction with  Non-ischemic cardiomyopathy - Patient has been started on Coreg 6.25mg  BID, Lisinopril 10mg  daily, and ASA 81mg  daily - Had elevated LVED will need diuretic, however will hold off given worsening renal function.  Will D/C Lisinopril for now - Cardiology recommends for patient to stay in house however he is adamant about leaving.  We have scheduled a follow up lab appointment to check a BMP tomorrow. -Add hydralazine 10mg  Q8 - Add IMDUR 30mg  daily    Hypokalemia- resolved -Patient initially severely hypokalemic, has now been started on Lisinopril -K+ this AM 5.1 - First available PCP appointment 7/6 will have paitent in for BMP on 6/25 in [facility] will follow up results by phone.    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    Protein-calorie malnutrition, severe - Supplements per nutrition    Abnormal CT scan of lung - Read Tb skin test today - Quantiferon Tb pending - PET scan at [facility] on 6/26 - Follow up with Dr Gwenette Greet on 7/9    Microscopic hematuria - Given smoking history will need to be followed up as outpatient   Dispo: Patient does not want to stay in hospital any longer despite recommendation to stay to monitor renal function.   He has capacity to make his own decisions and we will have him return tomorrow to the [facility] for a repeat of his BMP to evalate his renal function.  The patient does have a current PCP (No Pcp Per Patient) and does not need an [facility] hospital follow-up appointment after discharge.  The patient does not have transportation limitations that hinder transportation to clinic appointments.  .Services Needed at time of discharge: Y = Yes, Blank = No PT:   OT:   RN:   Equipment:   Other:     LOS: 5 days   Joni Reining, DO 03/28/2014, 10:06 AM

## 2014-03-29 ENCOUNTER — Other Ambulatory Visit (INDEPENDENT_AMBULATORY_CARE_PROVIDER_SITE_OTHER): Payer: Medicare Other

## 2014-03-29 ENCOUNTER — Ambulatory Visit: Payer: Medicare Other

## 2014-03-29 DIAGNOSIS — E876 Hypokalemia: Secondary | ICD-10-CM

## 2014-03-29 LAB — CULTURE, BLOOD (ROUTINE X 2)
CULTURE: NO GROWTH
Culture: NO GROWTH

## 2014-03-29 LAB — BASIC METABOLIC PANEL WITH GFR
BUN: 41 mg/dL — ABNORMAL HIGH (ref 6–23)
CALCIUM: 7.8 mg/dL — AB (ref 8.4–10.5)
CO2: 23 mEq/L (ref 19–32)
CREATININE: 1.21 mg/dL (ref 0.50–1.35)
Chloride: 99 mEq/L (ref 96–112)
GFR, EST AFRICAN AMERICAN: 71 mL/min
GFR, Est Non African American: 62 mL/min
Glucose, Bld: 103 mg/dL — ABNORMAL HIGH (ref 70–99)
Potassium: 3.8 mEq/L (ref 3.5–5.3)
SODIUM: 139 meq/L (ref 135–145)

## 2014-03-29 LAB — QUANTIFERON TB GOLD ASSAY (BLOOD)
Mitogen value: 0.01 IU/mL
Quantiferon Nil Value: 0.01 IU/mL
TB Ag value: 0.02 IU/mL
TB Antigen Minus Nil Value: 0.01 IU/mL

## 2014-03-29 LAB — MAGNESIUM: MAGNESIUM: 1.7 mg/dL (ref 1.5–2.5)

## 2014-03-30 ENCOUNTER — Ambulatory Visit (HOSPITAL_COMMUNITY)
Admission: RE | Admit: 2014-03-30 | Discharge: 2014-03-30 | Disposition: A | Discharge status: Home / Self Care | Payer: Medicare Other | Source: Ambulatory Visit | Attending: Otolaryngology | Admitting: Otolaryngology

## 2014-03-30 DIAGNOSIS — C049 Malignant neoplasm of floor of mouth, unspecified: Secondary | ICD-10-CM

## 2014-04-02 NOTE — Discharge Summary (Signed)
Reviewed DC Summary. Agree with documentation.

## 2014-04-04 ENCOUNTER — Ambulatory Visit: Payer: Medicare Other | Admitting: Cardiology

## 2014-04-09 ENCOUNTER — Encounter (HOSPITAL_COMMUNITY): Payer: Medicare Other

## 2014-04-12 ENCOUNTER — Ambulatory Visit (INDEPENDENT_AMBULATORY_CARE_PROVIDER_SITE_OTHER): Payer: Medicare Other | Admitting: Pulmonary Disease

## 2014-04-12 ENCOUNTER — Encounter: Payer: Self-pay | Admitting: Pulmonary Disease

## 2014-04-12 VITALS — BP 98/60 | HR 80 | Temp 96.7°F | Ht 72.0 in | Wt 154.0 lb

## 2014-04-12 DIAGNOSIS — R0602 Shortness of breath: Secondary | ICD-10-CM

## 2014-04-12 DIAGNOSIS — R918 Other nonspecific abnormal finding of lung field: Secondary | ICD-10-CM

## 2014-04-12 NOTE — Progress Notes (Signed)
   Subjective:    Patient ID: Julian Zimmerman, male    DOB: [birth year], 68 y.o.   MRN: [id number]  HPI Recent consult in hospital: HISTORY OF PRESENT ILLNESS: The patient is a very pleasant 68 year old  gentleman with very little prior medical care, who I have been asked to  see for an abnormal CT chest. He was recently diagnosed with squamous  cell carcinoma of the floor of the mouth, and is scheduled for surgery  at [facility] at the end of the month. He presented to the  emergency room with progressive dyspnea on exertion, and a CT angio was  done in order to rule out a pulmonary embolus. This did not show a PE,  but did show borderline lymphadenopathy, a small ground-glass opacity in  the left lower lobe, pleural thickening with nodularity, right-sided  pleural plaques primarily, medially, and finally large low attenuation  masses in the pleural space and fissures bilaterally. The patient has a  long history of smoking, and probably has COPD. He is being treated for  a COPD exacerbation. 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 The  patient denies any exposure to tuberculosis, but has never had a PPD  placed. He did, however, live in [place] for many years, but never in  the [place]. The patient has very little cough and no congestion,  but occasionally produces scant purulent mucus. He has not had  excessive weight loss.   The patient comes in today after his recent hospitalization for a COPD exacerbation.  He was discharged on Spiriva alone with albuterol for rescue, and has done extremely well since that time. He feels that his breathing is adequate, and he rarely requires his rescue inhaler. He denies any significant cough or mucus production. The patient also has a history of a cardiomyopathy, with chronic congestive heart failure  and lower extremity edema. He also has a history of an abnormal CT chest with loculated effusions and pleural calcifications. A recent PET scan that [facility] showed no abnormal uptake within his chest. He is getting ready to start chemotherapy for head and neck cancer next week in [place].   Review of Systems  Constitutional: Negative for fever and unexpected weight change.  HENT: Negative for congestion, dental problem, ear pain, nosebleeds, postnasal drip, rhinorrhea, sinus pressure, sneezing, sore throat and trouble swallowing.   Eyes: Negative for redness and itching.  Respiratory: Negative for cough, chest tightness, shortness of breath and wheezing.   Cardiovascular: Positive for leg swelling. Negative for palpitations.  Gastrointestinal: Negative for nausea and vomiting.  Genitourinary: Negative for dysuria.  Musculoskeletal: Positive for joint swelling.  Skin: Negative for rash.  Neurological: Negative for headaches.  Hematological: Does not bruise/bleed easily.  Psychiatric/Behavioral: Negative for dysphoric mood. The patient is not nervous/anxious.        Objective:   Physical Exam Thin male in no acute distress Nose without purulence or discharge noted Neck without lymphadenopathy or thyromegaly Chest with a few basilar crackles, but adequate airflow with no wheezing Cardiac exam with regular rate and rhythm Lower extremities with 1+ edema bilaterally, no cyanosis Alert and oriented, moves all 4 extremities.       Assessment & Plan:

## 2014-04-12 NOTE — Patient Instructions (Signed)
Stay on spiriva each am Can use albuterol as needed for rescue (either in inhaler form or nebulizer) Will schedule for breathing studies, and call you with results followup with me again in 61mos.

## 2014-04-12 NOTE — Assessment & Plan Note (Signed)
The patient has multiple abnormal findings on CT chest, but his recent PET scan shows no abnormal uptake within the chest. It is unclear whether his abnormal findings are related to asbestos exposure or possibly old granulomatous disease. The patient also has chronic congestive heart failure, and perhaps these are chronically loculated effusions.

## 2014-04-12 NOTE — Assessment & Plan Note (Signed)
The patient has significant dyspnea on exertion that I suspect is related to COPD.  However, he has not had pulmonary function studies, and we will need these for evaluation. He is currently on Spiriva alone, and feels that he is doing extremely well. I will therefore continue him on this regimen. At some point will refer him to pulmonary rehabilitation, but he is getting ready to start chemotherapy for his head and neck cancer next week.

## 2014-04-13 ENCOUNTER — Telehealth: Payer: Self-pay | Admitting: Pulmonary Disease

## 2014-04-13 DIAGNOSIS — J441 Chronic obstructive pulmonary disease with (acute) exacerbation: Secondary | ICD-10-CM

## 2014-04-13 NOTE — Telephone Encounter (Signed)
Per OV 04/12/14: Stay on spiriva each am Can use albuterol as needed for rescue (either in inhaler form or nebulizer) Will schedule for breathing studies, and call you with results followup with me again in 20mos --  Called spoke with pt. appt r/s for pft 05/04/14 at 4 pm her at [facility] d/t pt schedule. Pt wants an order to cancel all of his O2. He has not used the O2 in a very long time.  Wants an order sent to [facility]. Please advise [place] thanks

## 2014-04-13 NOTE — Telephone Encounter (Signed)
We did not assess his oxygen needs with exertion or sleep But if he is not going to wear, then no need to keep there Will d/c per pts request.

## 2014-04-13 NOTE — Telephone Encounter (Signed)
Pt aware of [place] response. He still wants to D/C o2. Order placed. Nothing further needed

## 2014-04-19 ENCOUNTER — Encounter (HOSPITAL_COMMUNITY): Payer: Medicare Other

## 2014-05-04 ENCOUNTER — Ambulatory Visit (INDEPENDENT_AMBULATORY_CARE_PROVIDER_SITE_OTHER): Payer: Medicare Other | Admitting: Pulmonary Disease

## 2014-05-04 DIAGNOSIS — R0602 Shortness of breath: Secondary | ICD-10-CM

## 2014-05-04 NOTE — Progress Notes (Signed)
PFT done today. 

## 2014-05-07 LAB — PULMONARY FUNCTION TEST
DL/VA % PRED: 49 %
DL/VA: 2.29 ml/min/mmHg/L
DLCO UNC: 13.06 ml/min/mmHg
DLCO unc % pred: 39 %
FEF 25-75 Post: 1.56 L/sec
FEF 25-75 Pre: 1.91 L/sec
FEF2575-%Change-Post: -18 %
FEF2575-%Pred-Post: 59 %
FEF2575-%Pred-Pre: 72 %
FEV1-%Change-Post: -1 %
FEV1-%PRED-POST: 88 %
FEV1-%PRED-PRE: 89 %
FEV1-Post: 2.68 L
FEV1-Pre: 2.72 L
FEV1FVC-%Change-Post: 0 %
FEV1FVC-%Pred-Pre: 95 %
FEV6-%Change-Post: 0 %
FEV6-%PRED-PRE: 96 %
FEV6-%Pred-Post: 96 %
FEV6-PRE: 3.69 L
FEV6-Post: 3.66 L
FEV6FVC-%Change-Post: 0 %
FEV6FVC-%PRED-POST: 104 %
FEV6FVC-%Pred-Pre: 103 %
FVC-%Change-Post: 0 %
FVC-%Pred-Post: 92 %
FVC-%Pred-Pre: 92 %
FVC-PRE: 3.7 L
FVC-Post: 3.66 L
POST FEV1/FVC RATIO: 73 %
PRE FEV6/FVC RATIO: 100 %
Post FEV6/FVC ratio: 100 %
Pre FEV1/FVC ratio: 73 %
RV % PRED: 93 %
RV: 2.29 L
TLC % pred: 82 %
TLC: 5.94 L

## 2014-05-09 ENCOUNTER — Telehealth: Payer: Self-pay | Admitting: Pulmonary Disease

## 2014-05-10 NOTE — Telephone Encounter (Signed)
Per [place]: Let pt know that he has only mild limitation to airflow in his breathing passages, and spiriva should be a good medication for him. However, he does have evidence for severe emphysema. No change in recommendation from last visit.  Lmtcbx2. Julian Zimmerman, CMA

## 2014-05-11 NOTE — Telephone Encounter (Signed)
Pt returned call.  Advised of PFT results/recs as stated by [facility] below.  Pt voiced his understanding and denied any questions/concerns at this time. Nothing further needed; will sign off.

## 2014-05-11 NOTE — Telephone Encounter (Signed)
LMOM TCB x2 - there is only 1 contact number listed for pt

## 2014-05-23 ENCOUNTER — Ambulatory Visit: Payer: Medicare Other | Admitting: Cardiovascular Disease

## 2014-07-25 ENCOUNTER — Ambulatory Visit: Payer: Medicare Other | Admitting: Cardiovascular Disease

## 2014-08-13 ENCOUNTER — Ambulatory Visit: Payer: Medicare Other | Admitting: Pulmonary Disease

## 2014-08-23 ENCOUNTER — Encounter: Payer: Self-pay | Admitting: Cardiovascular Disease

## 2014-09-13 ENCOUNTER — Encounter (HOSPITAL_COMMUNITY): Payer: Self-pay | Admitting: Interventional Cardiology

## 2014-10-08 DIAGNOSIS — M6281 Muscle weakness (generalized): Secondary | ICD-10-CM | POA: Diagnosis not present

## 2014-10-08 DIAGNOSIS — R262 Difficulty in walking, not elsewhere classified: Secondary | ICD-10-CM | POA: Diagnosis not present

## 2014-10-09 DIAGNOSIS — I509 Heart failure, unspecified: Secondary | ICD-10-CM | POA: Diagnosis not present

## 2014-10-09 DIAGNOSIS — I2510 Atherosclerotic heart disease of native coronary artery without angina pectoris: Secondary | ICD-10-CM | POA: Diagnosis not present

## 2014-10-09 DIAGNOSIS — Z87891 Personal history of nicotine dependence: Secondary | ICD-10-CM | POA: Diagnosis not present

## 2014-10-09 DIAGNOSIS — G629 Polyneuropathy, unspecified: Secondary | ICD-10-CM | POA: Diagnosis not present

## 2014-10-09 DIAGNOSIS — C049 Malignant neoplasm of floor of mouth, unspecified: Secondary | ICD-10-CM | POA: Diagnosis not present

## 2014-10-09 DIAGNOSIS — Z79899 Other long term (current) drug therapy: Secondary | ICD-10-CM | POA: Diagnosis not present

## 2014-10-09 DIAGNOSIS — I10 Essential (primary) hypertension: Secondary | ICD-10-CM | POA: Diagnosis not present

## 2014-10-09 DIAGNOSIS — J449 Chronic obstructive pulmonary disease, unspecified: Secondary | ICD-10-CM | POA: Diagnosis not present

## 2014-10-10 DIAGNOSIS — M6281 Muscle weakness (generalized): Secondary | ICD-10-CM | POA: Diagnosis not present

## 2014-10-10 DIAGNOSIS — R262 Difficulty in walking, not elsewhere classified: Secondary | ICD-10-CM | POA: Diagnosis not present

## 2014-10-12 ENCOUNTER — Emergency Department (HOSPITAL_COMMUNITY)
Admission: EM | Admit: 2014-10-12 | Discharge: 2014-10-12 | Disposition: A | Discharge status: Home / Self Care | Payer: Medicare Other | Attending: Emergency Medicine | Admitting: Emergency Medicine

## 2014-10-12 ENCOUNTER — Encounter (HOSPITAL_COMMUNITY)
Admission: EM | Disposition: A | Discharge status: Home / Self Care | Payer: Self-pay | Source: Home / Self Care | Attending: Emergency Medicine

## 2014-10-12 ENCOUNTER — Encounter (HOSPITAL_COMMUNITY): Payer: Self-pay

## 2014-10-12 ENCOUNTER — Encounter (HOSPITAL_COMMUNITY): Payer: Self-pay | Admitting: Nurse Practitioner

## 2014-10-12 DIAGNOSIS — Y9389 Activity, other specified: Secondary | ICD-10-CM | POA: Diagnosis not present

## 2014-10-12 DIAGNOSIS — Z85819 Personal history of malignant neoplasm of unspecified site of lip, oral cavity, and pharynx: Secondary | ICD-10-CM | POA: Insufficient documentation

## 2014-10-12 DIAGNOSIS — Y9289 Other specified places as the place of occurrence of the external cause: Secondary | ICD-10-CM | POA: Diagnosis not present

## 2014-10-12 DIAGNOSIS — T18108A Unspecified foreign body in esophagus causing other injury, initial encounter: Secondary | ICD-10-CM | POA: Diagnosis not present

## 2014-10-12 DIAGNOSIS — I5022 Chronic systolic (congestive) heart failure: Secondary | ICD-10-CM | POA: Insufficient documentation

## 2014-10-12 DIAGNOSIS — T17320A Food in larynx causing asphyxiation, initial encounter: Secondary | ICD-10-CM | POA: Diagnosis not present

## 2014-10-12 DIAGNOSIS — X58XXXA Exposure to other specified factors, initial encounter: Secondary | ICD-10-CM | POA: Insufficient documentation

## 2014-10-12 DIAGNOSIS — M1990 Unspecified osteoarthritis, unspecified site: Secondary | ICD-10-CM | POA: Insufficient documentation

## 2014-10-12 DIAGNOSIS — Z87891 Personal history of nicotine dependence: Secondary | ICD-10-CM | POA: Insufficient documentation

## 2014-10-12 DIAGNOSIS — Y998 Other external cause status: Secondary | ICD-10-CM | POA: Insufficient documentation

## 2014-10-12 DIAGNOSIS — Z79899 Other long term (current) drug therapy: Secondary | ICD-10-CM | POA: Insufficient documentation

## 2014-10-12 DIAGNOSIS — T18128A Food in esophagus causing other injury, initial encounter: Secondary | ICD-10-CM | POA: Insufficient documentation

## 2014-10-12 DIAGNOSIS — I10 Essential (primary) hypertension: Secondary | ICD-10-CM | POA: Diagnosis not present

## 2014-10-12 DIAGNOSIS — Z7982 Long term (current) use of aspirin: Secondary | ICD-10-CM | POA: Diagnosis not present

## 2014-10-12 DIAGNOSIS — R1310 Dysphagia, unspecified: Secondary | ICD-10-CM | POA: Diagnosis not present

## 2014-10-12 DIAGNOSIS — R0989 Other specified symptoms and signs involving the circulatory and respiratory systems: Secondary | ICD-10-CM | POA: Diagnosis present

## 2014-10-12 HISTORY — PX: FOREIGN BODY REMOVAL: SHX962

## 2014-10-12 SURGERY — REMOVAL, FOREIGN BODY
Anesthesia: Moderate Sedation

## 2014-10-12 MED ORDER — FENTANYL CITRATE 0.05 MG/ML IJ SOLN
INTRAMUSCULAR | Status: DC | PRN
  Administered 2014-10-12 (×2): 25 ug via INTRAVENOUS

## 2014-10-12 MED ORDER — GLUCAGON HCL RDNA (DIAGNOSTIC) 1 MG IJ SOLR
1.0000 mg | Freq: Once | INTRAMUSCULAR | Status: AC
  Administered 2014-10-12: 1 mg via INTRAVENOUS
  Filled 2014-10-12: qty 1

## 2014-10-12 MED ORDER — BUTAMBEN-TETRACAINE-BENZOCAINE 2-2-14 % EX AERO
INHALATION_SPRAY | CUTANEOUS | Status: DC | PRN
  Administered 2014-10-12: 2 via TOPICAL

## 2014-10-12 MED ORDER — FENTANYL CITRATE 0.05 MG/ML IJ SOLN
INTRAMUSCULAR | Status: AC
  Filled 2014-10-12: qty 2

## 2014-10-12 MED ORDER — MIDAZOLAM HCL 10 MG/2ML IJ SOLN
INTRAMUSCULAR | Status: DC | PRN
  Administered 2014-10-12 (×2): 2 mg via INTRAVENOUS

## 2014-10-12 MED ORDER — MIDAZOLAM HCL 10 MG/2ML IJ SOLN
INTRAMUSCULAR | Status: AC
  Filled 2014-10-12: qty 2

## 2014-10-12 NOTE — ED Notes (Signed)
Bed: WA03 Expected date:  Expected time:  Means of arrival:  Comments: EMS-choking

## 2014-10-12 NOTE — Discharge Instructions (Signed)
Liquids only today and may slowly advance diet tomorrow and follow-up with me in the office if swallowing problems continue but try to eat slowly and chew food very well and take small bites and drink plenty of fluids while eating and call if any question or problem

## 2014-10-12 NOTE — ED Provider Notes (Signed)
Patient is hemodynamically stable post procedure. Will discharge home with wife. No airway compromise  Nat Christen, MD 10/12/14 2235

## 2014-10-12 NOTE — ED Provider Notes (Signed)
CSN: [id number]     Arrival date & time 10/12/14  1750 History   First MD Initiated Contact with Patient 10/12/14 1813     Chief Complaint  Patient presents with  . Choking     (Consider location/radiation/quality/duration/timing/severity/associated sxs/prior Treatment) HPI..... Patient was eating chicken just prior to admission when he felt a sense of inability to swallow the chicken. He states it is stuck in his esophagus. Airway intact. Patient diagnosed with (oral cancer) summer of 2015. Patient given a beverage and he vomited it back up. No other complaints.  Past Medical History  Diagnosis Date  . Gout     "right leg" (03/23/2014)  . Arthritis     "right leg" (03/23/2014)  . Oral cancer   . Chronic systolic congestive heart failure   . HTN (hypertension)   . Tobacco abuse    Past Surgical History  Procedure Laterality Date  . Tonsillectomy and adenoidectomy  1955  . Floor of mouth biopsy  03/2014    "found cancer"  . Left and right heart catheterization with coronary angiogram N/A 03/27/2014    Procedure: LEFT AND RIGHT HEART CATHETERIZATION WITH CORONARY ANGIOGRAM;  Surgeon: Jettie Booze, MD;  Location: [facility] C[facility] LAB;  Service: Cardiovascular;  Laterality: N/A;  . Fractional flow reserve wire  03/27/2014    Procedure: FRACTIONAL FLOW RESERVE WIRE;  Surgeon: Jettie Booze, MD;  Location: [facility] CATH LAB;  Service: Cardiovascular;;   Family History  Problem Relation Age of Onset  . Diabetes Mother   . Diabetes Father    History  Substance Use Topics  . Smoking status: Former Smoker -- 1.00 packs/day for 52 years    Types: Cigarettes    Quit date: 03/05/2014  . Smokeless tobacco: Never Used  . Alcohol Use: 16.8 oz/week    28 Shots of liquor per week     Comment: 03/23/2014 "4, 1oz shots/day"    Review of Systems  All other systems reviewed and are negative.     Allergies  Review of patient's allergies indicates no known allergies.  Home Medications    Prior to Admission medications   Medication Sig Start Date End Date Taking? Authorizing Provider  aspirin EC 81 MG EC tablet Take 1 tablet (81 mg total) by mouth daily. 03/28/14  Yes Lucious Groves, DO  atorvastatin (LIPITOR) 40 MG tablet Take 1 tablet (40 mg total) by mouth daily at 6 PM. Patient taking differently: Take 40 mg by mouth daily.  03/28/14  Yes Lucious Groves, DO  Colloidal Oatmeal (GOLD BOND ULTRA ECZEMA RELIEF EX) Apply 1 application topically daily as needed (after washing).   Yes Historical Provider, MD  furosemide (LASIX) 20 MG tablet Take 20 mg by mouth daily as needed (swelling in legs or feet).   Yes Historical Provider, MD  gabapentin (NEURONTIN) 300 MG capsule Take 300-900 mg by mouth at bedtime. 1 cap for first 3 day, 2 caps for 3 more days, then finally 3 caps at night   Yes Historical Provider, MD  magnesium oxide (MAG-OX) 400 MG tablet Take 400 mg by mouth 3 (three) times daily.   04/08/15 Yes Historical Provider, MD  Soap & Cleansers (CETAPHIL GENTLE CLEANSER EX) Apply 1 application topically daily. Pt uses as body wash   Yes Historical Provider, MD  tiotropium (SPIRIVA HANDIHALER) 18 MCG inhalation capsule Place 1 capsule (18 mcg total) into inhaler and inhale daily. 03/26/14  Yes Lucious Groves, DO  albuterol (PROVENTIL HFA;VENTOLIN HFA) 108 (90 BASE)  MCG/ACT inhaler Inhale 2 puffs into the lungs every 6 (six) hours as needed for wheezing or shortness of breath. 03/26/14   Lucious Groves, DO  albuterol (PROVENTIL) (2.5 MG/3ML) 0.083% nebulizer solution Take 3 mLs (2.5 mg total) by nebulization every 4 (four) hours as needed for wheezing or shortness of breath. 03/28/14   Lucious Groves, DO  carvedilol (COREG) 6.25 MG tablet Take 1 tablet (6.25 mg total) by mouth 2 (two) times daily with a meal. 03/28/14   Lucious Groves, DO  hydrALAZINE (APRESOLINE) 10 MG tablet Take 1 tablet (10 mg total) by mouth 3 (three) times daily. 03/28/14   Lucious Groves, DO  isosorbide mononitrate  (IMDUR) 30 MG 24 hr tablet Take 1 tablet (30 mg total) by mouth daily. 03/28/14   Lucious Groves, DO   BP 138/78 mmHg  Pulse 87  Temp(Src) 98.3 F (36.8 C) (Oral)  Resp 18  SpO2 100% Physical Exam  Constitutional: He is oriented to person, place, and time. He appears well-developed and well-nourished.  HENT:  Head: Normocephalic and atraumatic.  Edentulous. No obvious foreign body in oropharyngeal area  Eyes: Conjunctivae and EOM are normal. Pupils are equal, round, and reactive to light.  Neck: Normal range of motion. Neck supple.  Cardiovascular: Normal rate and regular rhythm.   Pulmonary/Chest: Effort normal and breath sounds normal.  Abdominal: Soft. Bowel sounds are normal.  Musculoskeletal: Normal range of motion.  Neurological: He is alert and oriented to person, place, and time.  Skin: Skin is warm and dry.  Psychiatric: He has a normal mood and affect. His behavior is normal.  Nursing note and vitals reviewed.   ED Course  Procedures (including critical care time) Labs Review Labs Reviewed - No data to display  Imaging Review No results found.   EKG Interpretation None      MDM   Final diagnoses:  Foreign body in esophagus, initial encounter    IV glucagon given. Patient still unable to swallow. Discussed with Dr. Watt Climes.   He will evaluate patient.    Nat Christen, MD 10/12/14 2008

## 2014-10-12 NOTE — ED Notes (Signed)
Bed: WA03 Expected date:  Expected time:  Means of arrival:  Comments: Pt in Endoscopy

## 2014-10-12 NOTE — ED Notes (Signed)
Pt presents from home with c/o chocking. No airway obstruction, denies shortness of breath, maintaining O2 sats above 98% RA, hx of throat ca. Pt has mild throat rhonchi on auscultation. Symptom onset while eating chicken noodle soup. Pt NAD at this time.

## 2014-10-12 NOTE — Consult Note (Signed)
Reason for Consult: Food impaction Referring Physician: Dr. Lacinda Axon ER  Julian Zimmerman is an 69 y.o. male.  HPI: Patient with episodic dysphagia to chicken and meats ever since his chemotherapy for an oral cancer which has only been able to pass with time however today while eating chicken soup he felt like chicken was caught and he presented to the emergency room and was unable to drink water and despite glucagon he was unable to drink and we were asked to see him for urgent endoscopy for probable food impaction and he has had a colonoscopy in the past by the other group without any obvious problem but no other obvious GI complaints  Past Medical History  Diagnosis Date  . Gout     "right leg" (03/23/2014)  . Arthritis     "right leg" (03/23/2014)  . Oral cancer   . Chronic systolic congestive heart failure   . HTN (hypertension)   . Tobacco abuse     Past Surgical History  Procedure Laterality Date  . Tonsillectomy and adenoidectomy  1955  . Floor of mouth biopsy  03/2014    "found cancer"  . Left and right heart catheterization with coronary angiogram N/A 03/27/2014    Procedure: LEFT AND RIGHT HEART CATHETERIZATION WITH CORONARY ANGIOGRAM;  Surgeon: Jettie Booze, MD;  Location: [facility] CATH LAB;  Service: Cardiovascular;  Laterality: N/A;  . Fractional flow reserve wire  03/27/2014    Procedure: FRACTIONAL FLOW RESERVE WIRE;  Surgeon: Jettie Booze, MD;  Location: [facility] CATH LAB;  Service: Cardiovascular;;    Family History  Problem Relation Age of Onset  . Diabetes Mother   . Diabetes Father     Social History:  reports that he quit smoking about 7 months ago. His smoking use included Cigarettes. He has a 52 pack-year smoking history. He has never used smokeless tobacco. He reports that he drinks about 16.8 oz of alcohol per week. He reports that he does not use illicit drugs.  Allergies: No Known Allergies  Medications: I have reviewed the patient's current  medications.  No results found for this or any previous visit (from the past 48 hour(s)).  No results found.  ROS negative except above Blood pressure 138/78, pulse 87, temperature 98.3 F (36.8 C), temperature source Oral, resp. rate 18, SpO2 100 %. Physical Exam Vital signs stable afebrile exam please see preassessment evaluation Assessment/Plan: Food impaction Plan: The risks benefits methods of endoscopy was discussed and will proceed ASAP with further workup and plans pending those findings  Dermot Gremillion E 10/12/2014, 9:21 PM

## 2014-10-12 NOTE — ED Notes (Signed)
Patient transported to Endoscopy

## 2014-10-12 NOTE — Op Note (Signed)
[facility] [place], [address]   ENDOSCOPY PROCEDURE REPORT  PATIENT: Julian Zimmerman, Julian Zimmerman  MR#: [id number] BIRTHDATE: 1946-07-21 , 68  yrs. old GENDER: male ENDOSCOPIST: Clarene Essex, MD REFERRED BY:     ER doctor Lacinda Axon PROCEDURE DATE:  10-22-2014 PROCEDURE:  EGD w/ fb removal ASA CLASS:     Class III INDICATIONS:  foreign body removal from esophagus. MEDICATIONS: Fentanyl 50 mcg IV and Versed 5 mg IV TOPICAL ANESTHETIC: Cetacaine Spray  DESCRIPTION OF PROCEDURE: After the risks benefits and alternatives of the procedure were thoroughly explained, informed consent was obtained.  The Pentax Gastroscope [id number] endoscope was introduced through the mouth and advanced to the second portion of the duodenum , Without limitations.  The instrument was slowly withdrawn as the mucosa was fully examined.    the findings are recorded below       Retroflexed views revealed no abnormalities.     The scope was then withdrawn from the patient and the procedure completed.  COMPLICATIONS: There were no immediate complications.  ENDOSCOPIC IMPRESSION: 1. Obvious food impaction just below upper esophageal sphincter small pieces removed with the 4 pronged grabber and then was easily able to push remaining pieces into the stomach 2. Tiny hiatal hernia with minimal distal esophagitis 3. Small mid esophageal diverticulum 4. Otherwise within normal limits EGD but with residual food in the stomach complete visualization was not obtained  RECOMMENDATIONS: liquids only tonight slowly advance diet tomorrow chew food well eat slowly    happy to see back when necessary particularly if swallowing problems continue  REPEAT EXAM: as needed  eSigned:  Clarene Essex, MD October 22, 2014 10:01 PM    CC:  CPT CODES: ICD CODES:  The ICD and CPT codes recommended by this software are interpretations from the data that the clinical staff has captured with the software.  The  verification of the translation of this report to the ICD and CPT codes and modifiers is the sole responsibility of the health care institution and practicing physician where this report was generated.  [place]. will not be held responsible for the validity of the ICD and CPT codes included on this report.  AMA assumes no liability for data contained or not contained herein. CPT is a Designer, television/film set of the [employer].  PATIENT NAME:  Nassir, Neidert MR#: [id number]

## 2014-10-15 ENCOUNTER — Encounter (HOSPITAL_COMMUNITY): Payer: Self-pay | Admitting: Gastroenterology

## 2014-10-15 DIAGNOSIS — M6281 Muscle weakness (generalized): Secondary | ICD-10-CM | POA: Diagnosis not present

## 2014-10-15 DIAGNOSIS — R262 Difficulty in walking, not elsewhere classified: Secondary | ICD-10-CM | POA: Diagnosis not present

## 2014-10-17 DIAGNOSIS — M6281 Muscle weakness (generalized): Secondary | ICD-10-CM | POA: Diagnosis not present

## 2014-10-17 DIAGNOSIS — R262 Difficulty in walking, not elsewhere classified: Secondary | ICD-10-CM | POA: Diagnosis not present

## 2014-10-22 DIAGNOSIS — R262 Difficulty in walking, not elsewhere classified: Secondary | ICD-10-CM | POA: Diagnosis not present

## 2014-10-22 DIAGNOSIS — M6281 Muscle weakness (generalized): Secondary | ICD-10-CM | POA: Diagnosis not present

## 2014-10-22 DIAGNOSIS — Z452 Encounter for adjustment and management of vascular access device: Secondary | ICD-10-CM | POA: Diagnosis not present

## 2014-10-24 DIAGNOSIS — M6281 Muscle weakness (generalized): Secondary | ICD-10-CM | POA: Diagnosis not present

## 2014-10-24 DIAGNOSIS — R262 Difficulty in walking, not elsewhere classified: Secondary | ICD-10-CM | POA: Diagnosis not present

## 2014-11-05 DIAGNOSIS — R262 Difficulty in walking, not elsewhere classified: Secondary | ICD-10-CM | POA: Diagnosis not present

## 2014-11-05 DIAGNOSIS — M6281 Muscle weakness (generalized): Secondary | ICD-10-CM | POA: Diagnosis not present

## 2014-11-07 DIAGNOSIS — M6281 Muscle weakness (generalized): Secondary | ICD-10-CM | POA: Diagnosis not present

## 2014-11-07 DIAGNOSIS — R262 Difficulty in walking, not elsewhere classified: Secondary | ICD-10-CM | POA: Diagnosis not present

## 2014-11-12 DIAGNOSIS — M6281 Muscle weakness (generalized): Secondary | ICD-10-CM | POA: Diagnosis not present

## 2014-11-12 DIAGNOSIS — R262 Difficulty in walking, not elsewhere classified: Secondary | ICD-10-CM | POA: Diagnosis not present

## 2014-11-15 DIAGNOSIS — R262 Difficulty in walking, not elsewhere classified: Secondary | ICD-10-CM | POA: Diagnosis not present

## 2014-11-15 DIAGNOSIS — M6281 Muscle weakness (generalized): Secondary | ICD-10-CM | POA: Diagnosis not present

## 2014-11-22 DIAGNOSIS — R262 Difficulty in walking, not elsewhere classified: Secondary | ICD-10-CM | POA: Diagnosis not present

## 2014-11-22 DIAGNOSIS — M6281 Muscle weakness (generalized): Secondary | ICD-10-CM | POA: Diagnosis not present

## 2014-11-26 DIAGNOSIS — R262 Difficulty in walking, not elsewhere classified: Secondary | ICD-10-CM | POA: Diagnosis not present

## 2014-11-26 DIAGNOSIS — M6281 Muscle weakness (generalized): Secondary | ICD-10-CM | POA: Diagnosis not present

## 2014-11-27 DIAGNOSIS — Z6822 Body mass index (BMI) 22.0-22.9, adult: Secondary | ICD-10-CM | POA: Diagnosis not present

## 2014-11-27 DIAGNOSIS — J929 Pleural plaque without asbestos: Secondary | ICD-10-CM | POA: Diagnosis not present

## 2014-11-27 DIAGNOSIS — Z131 Encounter for screening for diabetes mellitus: Secondary | ICD-10-CM | POA: Diagnosis not present

## 2014-11-27 DIAGNOSIS — G629 Polyneuropathy, unspecified: Secondary | ICD-10-CM | POA: Diagnosis not present

## 2014-11-27 DIAGNOSIS — M47819 Spondylosis without myelopathy or radiculopathy, site unspecified: Secondary | ICD-10-CM | POA: Diagnosis not present

## 2014-11-27 DIAGNOSIS — R599 Enlarged lymph nodes, unspecified: Secondary | ICD-10-CM | POA: Diagnosis not present

## 2014-11-27 DIAGNOSIS — I509 Heart failure, unspecified: Secondary | ICD-10-CM | POA: Diagnosis not present

## 2014-11-27 DIAGNOSIS — Z87891 Personal history of nicotine dependence: Secondary | ICD-10-CM | POA: Diagnosis not present

## 2014-11-27 DIAGNOSIS — C049 Malignant neoplasm of floor of mouth, unspecified: Secondary | ICD-10-CM | POA: Diagnosis not present

## 2014-11-27 DIAGNOSIS — Z5111 Encounter for antineoplastic chemotherapy: Secondary | ICD-10-CM | POA: Diagnosis not present

## 2014-11-27 DIAGNOSIS — K1230 Oral mucositis (ulcerative), unspecified: Secondary | ICD-10-CM | POA: Diagnosis not present

## 2014-11-27 DIAGNOSIS — N179 Acute kidney failure, unspecified: Secondary | ICD-10-CM | POA: Diagnosis not present

## 2014-11-29 DIAGNOSIS — Z452 Encounter for adjustment and management of vascular access device: Secondary | ICD-10-CM | POA: Diagnosis not present

## 2014-12-04 DIAGNOSIS — M6281 Muscle weakness (generalized): Secondary | ICD-10-CM | POA: Diagnosis not present

## 2014-12-04 DIAGNOSIS — R262 Difficulty in walking, not elsewhere classified: Secondary | ICD-10-CM | POA: Diagnosis not present

## 2014-12-06 DIAGNOSIS — M6281 Muscle weakness (generalized): Secondary | ICD-10-CM | POA: Diagnosis not present

## 2014-12-06 DIAGNOSIS — R262 Difficulty in walking, not elsewhere classified: Secondary | ICD-10-CM | POA: Diagnosis not present

## 2014-12-10 DIAGNOSIS — E785 Hyperlipidemia, unspecified: Secondary | ICD-10-CM | POA: Diagnosis not present

## 2014-12-10 DIAGNOSIS — I5022 Chronic systolic (congestive) heart failure: Secondary | ICD-10-CM | POA: Diagnosis not present

## 2014-12-10 DIAGNOSIS — I10 Essential (primary) hypertension: Secondary | ICD-10-CM | POA: Diagnosis not present

## 2014-12-10 DIAGNOSIS — I2510 Atherosclerotic heart disease of native coronary artery without angina pectoris: Secondary | ICD-10-CM | POA: Diagnosis not present

## 2014-12-12 DIAGNOSIS — R262 Difficulty in walking, not elsewhere classified: Secondary | ICD-10-CM | POA: Diagnosis not present

## 2014-12-12 DIAGNOSIS — M6281 Muscle weakness (generalized): Secondary | ICD-10-CM | POA: Diagnosis not present

## 2014-12-17 DIAGNOSIS — C049 Malignant neoplasm of floor of mouth, unspecified: Secondary | ICD-10-CM | POA: Diagnosis not present

## 2014-12-17 DIAGNOSIS — Z6821 Body mass index (BMI) 21.0-21.9, adult: Secondary | ICD-10-CM | POA: Diagnosis not present

## 2014-12-26 ENCOUNTER — Emergency Department (HOSPITAL_COMMUNITY)
Admission: EM | Admit: 2014-12-26 | Discharge: 2014-12-26 | Disposition: A | Discharge status: Home / Self Care | Payer: Medicare Other | Attending: Emergency Medicine | Admitting: Emergency Medicine

## 2014-12-26 ENCOUNTER — Encounter (HOSPITAL_COMMUNITY): Payer: Self-pay | Admitting: Emergency Medicine

## 2014-12-26 ENCOUNTER — Encounter (HOSPITAL_COMMUNITY)
Admission: EM | Disposition: A | Discharge status: Home / Self Care | Payer: Medicare Other | Source: Home / Self Care | Attending: Emergency Medicine

## 2014-12-26 DIAGNOSIS — I5022 Chronic systolic (congestive) heart failure: Secondary | ICD-10-CM | POA: Insufficient documentation

## 2014-12-26 DIAGNOSIS — I10 Essential (primary) hypertension: Secondary | ICD-10-CM | POA: Insufficient documentation

## 2014-12-26 DIAGNOSIS — T18108A Unspecified foreign body in esophagus causing other injury, initial encounter: Secondary | ICD-10-CM

## 2014-12-26 DIAGNOSIS — T18128A Food in esophagus causing other injury, initial encounter: Secondary | ICD-10-CM | POA: Insufficient documentation

## 2014-12-26 DIAGNOSIS — Z7982 Long term (current) use of aspirin: Secondary | ICD-10-CM | POA: Insufficient documentation

## 2014-12-26 DIAGNOSIS — X58XXXA Exposure to other specified factors, initial encounter: Secondary | ICD-10-CM | POA: Insufficient documentation

## 2014-12-26 DIAGNOSIS — Z87891 Personal history of nicotine dependence: Secondary | ICD-10-CM | POA: Insufficient documentation

## 2014-12-26 DIAGNOSIS — Z79899 Other long term (current) drug therapy: Secondary | ICD-10-CM | POA: Diagnosis not present

## 2014-12-26 HISTORY — PX: ESOPHAGOGASTRODUODENOSCOPY: SHX5428

## 2014-12-26 HISTORY — PX: FOREIGN BODY REMOVAL: SHX962

## 2014-12-26 SURGERY — EGD (ESOPHAGOGASTRODUODENOSCOPY)
Anesthesia: Moderate Sedation

## 2014-12-26 MED ORDER — FENTANYL CITRATE 0.05 MG/ML IJ SOLN
INTRAMUSCULAR | Status: DC | PRN
  Administered 2014-12-26 (×3): 25 ug via INTRAVENOUS

## 2014-12-26 MED ORDER — MIDAZOLAM HCL 10 MG/2ML IJ SOLN
INTRAMUSCULAR | Status: AC
  Filled 2014-12-26: qty 2

## 2014-12-26 MED ORDER — GLUCAGON HCL RDNA (DIAGNOSTIC) 1 MG IJ SOLR
1.0000 mg | Freq: Once | INTRAMUSCULAR | Status: AC
  Administered 2014-12-26: 1 mg via INTRAVENOUS
  Filled 2014-12-26: qty 1

## 2014-12-26 MED ORDER — FENTANYL CITRATE 0.05 MG/ML IJ SOLN
INTRAMUSCULAR | Status: AC
  Filled 2014-12-26: qty 4

## 2014-12-26 MED ORDER — ONDANSETRON HCL 4 MG/2ML IJ SOLN
4.0000 mg | Freq: Once | INTRAMUSCULAR | Status: AC
  Administered 2014-12-26: 4 mg via INTRAVENOUS
  Filled 2014-12-26: qty 2

## 2014-12-26 MED ORDER — DIPHENHYDRAMINE HCL 50 MG/ML IJ SOLN
INTRAMUSCULAR | Status: AC
  Filled 2014-12-26: qty 1

## 2014-12-26 MED ORDER — MIDAZOLAM HCL 10 MG/2ML IJ SOLN
INTRAMUSCULAR | Status: DC | PRN
  Administered 2014-12-26 (×3): 2 mg via INTRAVENOUS

## 2014-12-26 MED ORDER — NITROGLYCERIN 0.4 MG SL SUBL
0.4000 mg | SUBLINGUAL_TABLET | SUBLINGUAL | Status: DC | PRN
  Administered 2014-12-26: 0.4 mg via SUBLINGUAL
  Filled 2014-12-26: qty 1

## 2014-12-26 NOTE — Discharge Instructions (Signed)
Endoscopy Care After Please read the instructions outlined below and refer to this sheet in the next few weeks. These discharge instructions provide you with general information on caring for yourself after you leave the hospital. Your doctor may also give you specific instructions. While your treatment has been planned according to the most current medical practices available, unavoidable complications occasionally occur. If you have any problems or questions after discharge, please call Dr. Paulita Fujita [facility]) at [phone].  HOME CARE INSTRUCTIONS Activity  You may resume your regular activity but move at a slower pace for the next 24 hours.   Take frequent rest periods for the next 24 hours.   Walking will help expel (get rid of) the air and reduce the bloated feeling in your abdomen.   No driving for 24 hours (because of the anesthesia (medicine) used during the test).   You may shower.   Do not sign any important legal documents or operate any machinery for 24 hours (because of the anesthesia used during the test).  Nutrition  Drink plenty of fluids.   Liquid diet only today.  Well-chopped or pureed foods only, INDEFINITELY  Begin with a light meal and progress to your normal diet.   Avoid alcoholic beverages for 24 hours or as instructed by your caregiver.  Medications You may resume your normal medications unless your caregiver tells you otherwise. What you can expect today  You may experience abdominal discomfort such as a feeling of fullness or "gas" pains.   You may experience a sore throat for 2 to 3 days. This is normal. Gargling with salt water may help this.    SEEK IMMEDIATE MEDICAL CARE IF:  You have excessive nausea (feeling sick to your stomach) and/or vomiting.   You have severe abdominal pain and distention (swelling).   You have trouble swallowing.   You have a temperature over 100 F (37.8 C).   You have rectal bleeding or vomiting  of blood.  Document Released: 05/05/2004 Document Revised: 06/03/2011 Document Reviewed: 11/16/2007 [facility] Patient Information 2012 [place].

## 2014-12-26 NOTE — ED Notes (Signed)
Unable to tolerate PO fluids-states he has not been spitting up as much-MD aware

## 2014-12-26 NOTE — ED Notes (Signed)
Pt states that he has a piece of meat stuck in his throat x 30 mins.  States that this has happened before and had to have an endoscopy to have it removed.  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  Airway clear but is not able to maintain secretions.

## 2014-12-26 NOTE — ED Provider Notes (Addendum)
CSN: [id number]     Arrival date & time 12/26/14  1414 History   First MD Initiated Contact with Patient 12/26/14 1437     Chief Complaint  Patient presents with  . Meat stuck in throat       HPI  Vision presents for evaluation of "Meat stuck in my throat". Patient states that approximately 2:00 Saturday was eating some stool. 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 States "I can't she will good". His like he has food stuck in his esophagus reports the base of his neck just above the sternal notch. Previous endoscopy for food impaction January 2013. No strictures or other structural abnormalities during that endoscopy noted. Has not followed up with gastroenterology.  Past Medical History  Diagnosis Date  . Gout     "right leg" (03/23/2014)  . Arthritis     "right leg" (03/23/2014)  . Oral cancer   . Chronic systolic congestive heart failure   . HTN (hypertension)   . Tobacco abuse    Past Surgical History  Procedure Laterality Date  . Tonsillectomy and adenoidectomy  1955  . Floor of mouth biopsy  03/2014    "found cancer"  . Left and right heart catheterization with coronary angiogram N/A 03/27/2014    Procedure: LEFT AND RIGHT HEART CATHETERIZATION WITH CORONARY ANGIOGRAM;  Surgeon: Jettie Booze, MD;  Location: [facility] CATH LAB;  Service: Cardiovascular;  Laterality: N/A;  . Fractional flow reserve wire  03/27/2014    Procedure: FRACTIONAL FLOW RESERVE WIRE;  Surgeon: Jettie Booze, MD;  Location: [facility] CATH LAB;  Service: Cardiovascular;;  . Foreign body removal N/A 10/12/2014    Procedure: FOREIGN BODY REMOVAL;  Surgeon: Jeryl Columbia, MD;  Location: [facility] ENDOSCOPY;  Service: Endoscopy;  Laterality: N/A;   Family History  Problem Relation Age of Onset  . Diabetes Mother   . Diabetes Father    History  Substance Use Topics  . Smoking status: Former Smoker -- 1.00 packs/day for 52 years    Types: Cigarettes    Quit date: 03/05/2014  . Smokeless  tobacco: Never Used  . Alcohol Use: 16.8 oz/week    28 Shots of liquor per week     Comment: 03/23/2014 "4, 1oz shots/day"    Review of Systems  Constitutional: Negative for fever, chills, diaphoresis, appetite change and fatigue.  HENT: Positive for drooling. Negative for mouth sores, sore throat and trouble swallowing.   Eyes: Negative for visual disturbance.  Respiratory: Negative for cough, chest tightness, shortness of breath and wheezing.   Cardiovascular: Negative for chest pain.  Gastrointestinal: Negative for nausea, vomiting, abdominal pain, diarrhea and abdominal distention.  Endocrine: Negative for polydipsia, polyphagia and polyuria.  Genitourinary: Negative for dysuria, frequency and hematuria.  Musculoskeletal: Positive for neck pain. Negative for gait problem.  Skin: Negative for color change, pallor and rash.  Neurological: Negative for dizziness, syncope, light-headedness and headaches.  Hematological: Does not bruise/bleed easily.  Psychiatric/Behavioral: Negative for behavioral problems and confusion.      Allergies  Review of patient's allergies indicates no known allergies.  Home Medications   Prior to Admission medications   Medication Sig Start Date End Date Taking? Authorizing Provider  aspirin EC 81 MG EC tablet Take 1 tablet (81 mg total) by mouth daily. 03/28/14  Yes Lucious Groves, DO  atorvastatin (LIPITOR) 40 MG tablet Take 1 tablet (40 mg total) by mouth daily at 6 PM. Patient taking differently: Take 40 mg by  mouth daily.  03/28/14  Yes Lucious Groves, DO  Calcium-Phosphorus-Vitamin D (CITRACAL +D3 PO) Take 2 tablets by mouth daily.   Yes Historical Provider, MD  docusate sodium (COLACE) 100 MG capsule Take 100 mg by mouth 2 (two) times daily.   Yes Historical Provider, MD  furosemide (LASIX) 20 MG tablet Take 20 mg by mouth daily as needed (swelling in legs or feet).   Yes Historical Provider, MD  gabapentin (NEURONTIN) 300 MG capsule Take 300-900  mg by mouth at bedtime. 1 cap for first 3 day, 2 caps for 3 more days, then finally 3 caps at night   Yes Historical Provider, MD  magnesium oxide (MAG-OX) 400 MG tablet Take 400 mg by mouth 3 (three) times daily.   04/08/15 Yes Historical Provider, MD  metoprolol succinate (TOPROL-XL) 50 MG 24 hr tablet Take 50 mg by mouth daily. 12/10/14  Yes Historical Provider, MD  Soap & Cleansers (CETAPHIL GENTLE CLEANSER EX) Apply 1 application topically daily. Pt uses as body wash   Yes Historical Provider, MD  tiotropium (SPIRIVA HANDIHALER) 18 MCG inhalation capsule Place 1 capsule (18 mcg total) into inhaler and inhale daily. 03/26/14  Yes Lucious Groves, DO  albuterol (PROVENTIL HFA;VENTOLIN HFA) 108 (90 BASE) MCG/ACT inhaler Inhale 2 puffs into the lungs every 6 (six) hours as needed for wheezing or shortness of breath. 03/26/14   Lucious Groves, DO  albuterol (PROVENTIL) (2.5 MG/3ML) 0.083% nebulizer solution Take 3 mLs (2.5 mg total) by nebulization every 4 (four) hours as needed for wheezing or shortness of breath. 03/28/14   Lucious Groves, DO  carvedilol (COREG) 6.25 MG tablet Take 1 tablet (6.25 mg total) by mouth 2 (two) times daily with a meal. 03/28/14   Lucious Groves, DO  hydrALAZINE (APRESOLINE) 10 MG tablet Take 1 tablet (10 mg total) by mouth 3 (three) times daily. 03/28/14   Lucious Groves, DO  isosorbide mononitrate (IMDUR) 30 MG 24 hr tablet Take 1 tablet (30 mg total) by mouth daily. 03/28/14   Lucious Groves, DO   BP 141/69 mmHg  Pulse 83  Temp(Src) 98.3 F (36.8 C) (Oral)  Resp 18  SpO2 100% Physical Exam  Constitutional: He is oriented to person, place, and time. He appears well-developed and well-nourished. No distress.  HENT:  Head: Normocephalic.  Previous multiple extractions. Stigmata of previous oral surgery. Drooling and spitting clear secretions.  Eyes: Conjunctivae are normal. Pupils are equal, round, and reactive to light. No scleral icterus.  Neck: Normal range of motion.  Neck supple. No thyromegaly present.  Cardiovascular: Normal rate and regular rhythm.  Exam reveals no gallop and no friction rub.   No murmur heard. Pulmonary/Chest: Effort normal and breath sounds normal. No respiratory distress. He has no wheezes. He has no rales.  Abdominal: Soft. Bowel sounds are normal. He exhibits no distension. There is no tenderness. There is no rebound.  Musculoskeletal: Normal range of motion.  Neurological: He is alert and oriented to person, place, and time.  Skin: Skin is warm and dry. No rash noted.  Psychiatric: He has a normal mood and affect. His behavior is normal.    ED Course  Procedures (including critical care time) Labs Review Labs Reviewed - No data to display  Imaging Review No results found.   EKG Interpretation None      MDM   Final diagnoses:  Esophageal foreign body, initial encounter   IV placed. Given Zofran, glucagon 1 mg, but questions a  little 1. He has no improvement. He continues to drool. He continues to have sensation of foreign body. I will consult gastrology regarding extraction.  Tanna Furry, MD 12/26/14 [place], MD 12/26/14 [phone]

## 2014-12-26 NOTE — Consult Note (Signed)
[facility] Consultation Note  Referring Provider:  Dr. Tanna Furry (ED) Primary Care Physician:  Gavin Pound, MD  Reason for Consultation:  Possible esophageal food impaction  HPI: Julian Zimmerman is a 69 y.o. male presenting with suspected esophageal food impaction.  Swallowed some beef stew a couple hours ago, feels it's stuck in his proximal esophagus.  Is having sialorrhea and inability to tolerate secretions.  No chest pain.  No abdominal pain.  Had food impaction in January 2016 with Dr. Watt Climes.  No troubles per patient with dysphagia outside of today and January.   Past Medical History  Diagnosis Date  . Gout     "right leg" (03/23/2014)  . Arthritis     "right leg" (03/23/2014)  . Oral cancer   . Chronic systolic congestive heart failure   . HTN (hypertension)   . Tobacco abuse     Past Surgical History  Procedure Laterality Date  . Tonsillectomy and adenoidectomy  1955  . Floor of mouth biopsy  03/2014    "found cancer"  . Left and right heart catheterization with coronary angiogram N/A 03/27/2014    Procedure: LEFT AND RIGHT HEART CATHETERIZATION WITH CORONARY ANGIOGRAM;  Surgeon: Jettie Booze, MD;  Location: [facility] CATH LAB;  Service: Cardiovascular;  Laterality: N/A;  . Fractional flow reserve wire  03/27/2014    Procedure: FRACTIONAL FLOW RESERVE WIRE;  Surgeon: Jettie Booze, MD;  Location: [facility] C[facility] LAB;  Service: Cardiovascular;;  . Foreign body removal N/A 10/12/2014    Procedure: FOREIGN BODY REMOVAL;  Surgeon: Jeryl Columbia, MD;  Location: [facility] ENDOSCOPY;  Service: Endoscopy;  Laterality: N/A;    Prior to Admission medications   Medication Sig Start Date End Date Taking? Authorizing Provider  aspirin EC 81 MG EC tablet Take 1 tablet (81 mg total) by mouth daily. 03/28/14  Yes Lucious Groves, DO  atorvastatin (LIPITOR) 40 MG tablet Take 1 tablet (40 mg total) by mouth daily at 6 PM. Patient taking differently: Take 40 mg by mouth daily.  03/28/14  Yes  Lucious Groves, DO  Calcium-Phosphorus-Vitamin D (CITRACAL +D3 PO) Take 2 tablets by mouth daily.   Yes Historical Provider, MD  docusate sodium (COLACE) 100 MG capsule Take 100 mg by mouth 2 (two) times daily.   Yes Historical Provider, MD  furosemide (LASIX) 20 MG tablet Take 20 mg by mouth daily as needed (swelling in legs or feet).   Yes Historical Provider, MD  gabapentin (NEURONTIN) 300 MG capsule Take 300-900 mg by mouth at bedtime. 1 cap for first 3 day, 2 caps for 3 more days, then finally 3 caps at night   Yes Historical Provider, MD  magnesium oxide (MAG-OX) 400 MG tablet Take 400 mg by mouth 3 (three) times daily.   04/08/15 Yes Historical Provider, MD  metoprolol succinate (TOPROL-XL) 50 MG 24 hr tablet Take 50 mg by mouth daily. 12/10/14  Yes Historical Provider, MD  Soap & Cleansers (CETAPHIL GENTLE CLEANSER EX) Apply 1 application topically daily. Pt uses as body wash   Yes Historical Provider, MD  tiotropium (SPIRIVA HANDIHALER) 18 MCG inhalation capsule Place 1 capsule (18 mcg total) into inhaler and inhale daily. 03/26/14  Yes Lucious Groves, DO  albuterol (PROVENTIL HFA;VENTOLIN HFA) 108 (90 BASE) MCG/ACT inhaler Inhale 2 puffs into the lungs every 6 (six) hours as needed for wheezing or shortness of breath. 03/26/14   Lucious Groves, DO  albuterol (PROVENTIL) (2.5 MG/3ML) 0.083% nebulizer solution Take 3 mLs (  2.5 mg total) by nebulization every 4 (four) hours as needed for wheezing or shortness of breath. 03/28/14   Lucious Groves, DO  carvedilol (COREG) 6.25 MG tablet Take 1 tablet (6.25 mg total) by mouth 2 (two) times daily with a meal. 03/28/14   Lucious Groves, DO  hydrALAZINE (APRESOLINE) 10 MG tablet Take 1 tablet (10 mg total) by mouth 3 (three) times daily. 03/28/14   Lucious Groves, DO  isosorbide mononitrate (IMDUR) 30 MG 24 hr tablet Take 1 tablet (30 mg total) by mouth daily. 03/28/14   Lucious Groves, DO    Current Facility-Administered Medications  Medication Dose Route  Frequency Provider Last Rate Last Dose  . [MAR Hold] nitroGLYCERIN (NITROSTAT) SL tablet 0.4 mg  0.4 mg Sublingual Q5 min PRN Tanna Furry, MD   0.4 mg at 12/26/14 1539    Allergies as of 12/26/2014  . (No Known Allergies)    Family History  Problem Relation Age of Onset  . Diabetes Mother   . Diabetes Father     History   Social History  . Marital Status: Married    Spouse Name: N/A  . Number of Children: N/A  . Years of Education: N/A   Occupational History  . retired [employer]    "making uranium pelets"   .  [place] History Main Topics  . Smoking status: Former Smoker -- 1.00 packs/day for 52 years    Types: Cigarettes    Quit date: 03/05/2014  . Smokeless tobacco: Never Used  . Alcohol Use: 16.8 oz/week    28 Shots of liquor per week     Comment: 03/23/2014 "4, 1oz shots/day"  . Drug Use: No  . Sexual Activity: Not Currently   Other Topics Concern  . Not on file   Social History Narrative    Review of Systems: As per HPI, all others negative  Physical Exam: Vital signs in last 24 hours: Temp:  [98.1 F (36.7 C)-98.3 F (36.8 C)] 98.1 F (36.7 C) (03/23 1651) Pulse Rate:  [81-83] 81 (03/23 1651) Resp:  [14-18] 14 (03/23 1651) BP: (141-161)/(67-79) 144/79 mmHg (03/23 1651) SpO2:  [100 %] 100 % (03/23 1651)   General:   Alert,  Well-developed, well-nourished, pleasant and cooperative in NAD Head:  Normocephalic and atraumatic. Eyes:  Sclera clear, no icterus.   Conjunctiva pink. Ears:  Normal auditory acuity. Nose:  No deformity, discharge,  or lesions. Mouth:  Poor dentition; No deformity or lesions.  Oropharynx pink & moist. Neck:  Supple; no masses or thyromegaly. Lungs:  Clear throughout to auscultation.   No wheezes, crackles, or rhonchi. No acute distress. Heart:  Regular rate and rhythm; no murmurs, clicks, rubs,  or gallops. Abdomen:  Soft, nontender and nondistended. No masses,  hepatosplenomegaly or hernias noted. Normal bowel sounds, without guarding, and without rebound.     Msk:  Symmetrical without gross deformities. Normal posture. Pulses:  Normal pulses noted. Extremities:  Without clubbing or edema. Neurologic:  Alert and  oriented x4;  grossly normal neurologically. Skin:  Intact without significant lesions or rashes. Cervical Nodes:  No significant cervical adenopathy. Psych:  Alert and cooperative. Normal mood and affect.   Lab Results: No results for input(s): WBC, HGB, HCT, PLT in the last 72 hours. BMET No results for input(s): NA, K, CL, CO2, GLUCOSE, BUN, CREATININE, CALCIUM in the last 72 hours. LFT No results for input(s): PROT, ALBUMIN, AST, ALT,  ALKPHOS, BILITOT, BILIDIR, IBILI in the last 72 hours. PT/INR No results for input(s): LABPROT, INR in the last 72 hours.  Studies/Results: No results found.  Impression:  Suspected esophageal food impaction.  Doubt intrinsic esophageal lesion.  Suspect he is chewing too big of bites, in setting of poor dentition and currently awaiting dentures.  Plan:  1.  Endoscopy with anticipated foreign body removal. 2.  Risks (bleeding, infection, bowel perforation that could require surgery, sedation-related changes in cardiopulmonary systems), benefits (identification and possible treatment of source of symptoms, exclusion of certain causes of symptoms), and alternatives (watchful waiting, radiographic imaging studies, empiric medical treatment) of upper endoscopy with possible esophageal foreign body removal (EGD) were explained to patient/family in detail and patient wishes to proceed.     Landry Dyke  12/26/2014, 5:09 PM

## 2014-12-26 NOTE — ED Notes (Signed)
Bed: CC88 Expected date:  Expected time:  Means of arrival:  Comments: Hold for res b

## 2014-12-26 NOTE — Op Note (Addendum)
[facility] [place], [address]   ENDOSCOPY PROCEDURE REPORT  PATIENT: Julian Zimmerman, Julian Zimmerman  MR#: [id number] BIRTHDATE: [birth year] , 68  yrs. old GENDER: male ENDOSCOPIST: Arta Silence, MD REFERRED BY: PROCEDURE DATE:  Jan 23, 2015 PROCEDURE:  EGD w/ fb removal ASA CLASS:     Class III INDICATIONS:  suspected esophageal food impaction. MEDICATIONS: Fentanyl 75 mcg IV and Versed 6 mg IV TOPICAL ANESTHETIC: DESCRIPTION OF PROCEDURE: After the risks benefits and alternatives of the procedure were thoroughly explained, informed consent was obtained.  The Pentax Gastroscope [id number] endoscope was introduced through the mouth and advanced to the second portion of the duodenum. The instrument was slowly withdrawn as the mucosa was fully examined. Estimated blood loss is zero unless otherwise noted in this procedure report.   Findings:  Large bolus of food in the very proximal esophagus, just distal to the upper esophageal sphincter.  Using both 4-prong graspers and Jabier Mutton net, the bolus was extracted through the mouth. Small mid esophageal diverticulum.  Otherwise normal esophagus; specifically, no esophageal mass, stricture, or mucosal features of eosinophilic esophagitis were identified.  There was some erythema and edema in the proximal esophagus, likely due to protracted esophageal food impaction.   Large amount of retained food was noted in the stomach, obscuring some views, but no obvious abnormalities in the stomach.  Pylorus normal.  Duodenum normal to the second portion.              The scope was then withdrawn from the patient and the procedure completed.  COMPLICATIONS: There were no immediate complications.  ENDOSCOPIC IMPRESSION:     Proximal esophageal food impaction, resolved as above.  No underlying esophageal process seen.  I strongly suspect esophageal food impactions over the past couple months have been from swallowing prohibitively  large pieces of food.  Patient is likely swallowing prohibitively large pieces of food due to his poor dentition and lack of use of dentures at the present time.  RECOMMENDATIONS:     1.  Watch for potential complications of procedure. 2.  Liquid diet only today. 3.  Starting tomorrow, patient should consume only finely chopped foods.  No fibrous meats, such as chicken or steak, unless they have been finely chopped or pureed.  No raw fruits and vegetables, unless they have been finely chopped or pureed.  No hard-crusted breads, such as french bread. 4.  Patient needs to obtain dentures to facilitate better chewing his food, or I suspect this problem will recur repeatedly. 5.  Don't see clear need for follow-up with [facility], as I don't think his food impactions are due to underlying esophageal process, although we're happy to follow-up with him on as-needed basis.  eSigned:  Arta Silence, MD 01/23/2015 5:54 PM   CC:  CPT CODES: ICD CODES: The ICD and CPT coes recommended by this software are interpretations from the data that the clinical staff has captured with the software.  The verification of the translation of this report to the ICD and CPT codes and modifiers is the sole respnsibility of the health care institution and practicing physician where this report was generated.  [place]. will not be held responsible for the validity of the ICD and CPT codes included on this report.  AMA assumes no liability for data contained or not contained herein. CPT is a Designer, television/film set of the [employer].

## 2014-12-26 NOTE — ED Notes (Signed)
Off floor to endoscopy

## 2014-12-27 ENCOUNTER — Encounter (HOSPITAL_COMMUNITY): Payer: Self-pay | Admitting: Gastroenterology

## 2014-12-28 DIAGNOSIS — Z452 Encounter for adjustment and management of vascular access device: Secondary | ICD-10-CM | POA: Diagnosis not present

## 2015-01-25 DIAGNOSIS — Z452 Encounter for adjustment and management of vascular access device: Secondary | ICD-10-CM | POA: Diagnosis not present

## 2015-02-04 DIAGNOSIS — C049 Malignant neoplasm of floor of mouth, unspecified: Secondary | ICD-10-CM | POA: Diagnosis not present

## 2015-02-04 DIAGNOSIS — Z452 Encounter for adjustment and management of vascular access device: Secondary | ICD-10-CM | POA: Diagnosis not present

## 2015-02-08 DIAGNOSIS — R351 Nocturia: Secondary | ICD-10-CM | POA: Diagnosis not present

## 2015-02-08 DIAGNOSIS — M109 Gout, unspecified: Secondary | ICD-10-CM | POA: Diagnosis not present

## 2015-02-08 DIAGNOSIS — K5900 Constipation, unspecified: Secondary | ICD-10-CM | POA: Diagnosis not present

## 2015-02-08 DIAGNOSIS — C4492 Squamous cell carcinoma of skin, unspecified: Secondary | ICD-10-CM | POA: Diagnosis not present

## 2015-02-08 DIAGNOSIS — D72819 Decreased white blood cell count, unspecified: Secondary | ICD-10-CM | POA: Diagnosis not present

## 2015-02-08 DIAGNOSIS — D126 Benign neoplasm of colon, unspecified: Secondary | ICD-10-CM | POA: Diagnosis not present

## 2015-02-08 DIAGNOSIS — N400 Enlarged prostate without lower urinary tract symptoms: Secondary | ICD-10-CM | POA: Diagnosis not present

## 2015-02-08 DIAGNOSIS — I420 Dilated cardiomyopathy: Secondary | ICD-10-CM | POA: Diagnosis not present

## 2015-02-18 DIAGNOSIS — Z6821 Body mass index (BMI) 21.0-21.9, adult: Secondary | ICD-10-CM | POA: Diagnosis not present

## 2015-02-18 DIAGNOSIS — C049 Malignant neoplasm of floor of mouth, unspecified: Secondary | ICD-10-CM | POA: Diagnosis not present

## 2015-03-05 DIAGNOSIS — Z6822 Body mass index (BMI) 22.0-22.9, adult: Secondary | ICD-10-CM | POA: Diagnosis not present

## 2015-03-05 DIAGNOSIS — C049 Malignant neoplasm of floor of mouth, unspecified: Secondary | ICD-10-CM | POA: Diagnosis not present

## 2015-03-13 DIAGNOSIS — I10 Essential (primary) hypertension: Secondary | ICD-10-CM | POA: Diagnosis not present

## 2015-03-13 DIAGNOSIS — I2510 Atherosclerotic heart disease of native coronary artery without angina pectoris: Secondary | ICD-10-CM | POA: Diagnosis not present

## 2015-03-13 DIAGNOSIS — I5022 Chronic systolic (congestive) heart failure: Secondary | ICD-10-CM | POA: Diagnosis not present

## 2015-03-14 DIAGNOSIS — Z86010 Personal history of colonic polyps: Secondary | ICD-10-CM | POA: Diagnosis not present

## 2015-03-14 DIAGNOSIS — K5900 Constipation, unspecified: Secondary | ICD-10-CM | POA: Diagnosis not present

## 2015-03-15 DIAGNOSIS — K5900 Constipation, unspecified: Secondary | ICD-10-CM | POA: Diagnosis not present

## 2015-03-15 DIAGNOSIS — Z86010 Personal history of colonic polyps: Secondary | ICD-10-CM | POA: Diagnosis not present

## 2015-04-17 DIAGNOSIS — Z87891 Personal history of nicotine dependence: Secondary | ICD-10-CM | POA: Diagnosis not present

## 2015-04-17 DIAGNOSIS — C049 Malignant neoplasm of floor of mouth, unspecified: Secondary | ICD-10-CM | POA: Diagnosis not present

## 2015-04-17 DIAGNOSIS — Z6822 Body mass index (BMI) 22.0-22.9, adult: Secondary | ICD-10-CM | POA: Diagnosis not present

## 2015-04-17 DIAGNOSIS — C040 Malignant neoplasm of anterior floor of mouth: Secondary | ICD-10-CM | POA: Diagnosis not present

## 2015-04-17 DIAGNOSIS — I509 Heart failure, unspecified: Secondary | ICD-10-CM | POA: Diagnosis not present

## 2015-04-17 DIAGNOSIS — G629 Polyneuropathy, unspecified: Secondary | ICD-10-CM | POA: Diagnosis not present

## 2015-04-17 DIAGNOSIS — Z923 Personal history of irradiation: Secondary | ICD-10-CM | POA: Diagnosis not present

## 2015-04-17 DIAGNOSIS — H6123 Impacted cerumen, bilateral: Secondary | ICD-10-CM | POA: Diagnosis not present

## 2015-04-17 DIAGNOSIS — Z9221 Personal history of antineoplastic chemotherapy: Secondary | ICD-10-CM | POA: Diagnosis not present

## 2015-06-19 DIAGNOSIS — C069 Malignant neoplasm of mouth, unspecified: Secondary | ICD-10-CM | POA: Diagnosis not present

## 2015-06-19 DIAGNOSIS — M109 Gout, unspecified: Secondary | ICD-10-CM | POA: Diagnosis not present

## 2015-06-19 DIAGNOSIS — D126 Benign neoplasm of colon, unspecified: Secondary | ICD-10-CM | POA: Diagnosis not present

## 2015-06-19 DIAGNOSIS — I420 Dilated cardiomyopathy: Secondary | ICD-10-CM | POA: Diagnosis not present

## 2015-06-19 DIAGNOSIS — N400 Enlarged prostate without lower urinary tract symptoms: Secondary | ICD-10-CM | POA: Diagnosis not present

## 2015-06-24 DIAGNOSIS — K1230 Oral mucositis (ulcerative), unspecified: Secondary | ICD-10-CM | POA: Diagnosis not present

## 2015-06-24 DIAGNOSIS — Z6823 Body mass index (BMI) 23.0-23.9, adult: Secondary | ICD-10-CM | POA: Diagnosis not present

## 2015-06-24 DIAGNOSIS — G620 Drug-induced polyneuropathy: Secondary | ICD-10-CM | POA: Diagnosis not present

## 2015-06-24 DIAGNOSIS — I509 Heart failure, unspecified: Secondary | ICD-10-CM | POA: Diagnosis not present

## 2015-06-24 DIAGNOSIS — M47819 Spondylosis without myelopathy or radiculopathy, site unspecified: Secondary | ICD-10-CM | POA: Diagnosis not present

## 2015-06-24 DIAGNOSIS — J432 Centrilobular emphysema: Secondary | ICD-10-CM | POA: Diagnosis not present

## 2015-06-24 DIAGNOSIS — R599 Enlarged lymph nodes, unspecified: Secondary | ICD-10-CM | POA: Diagnosis not present

## 2015-06-24 DIAGNOSIS — Z131 Encounter for screening for diabetes mellitus: Secondary | ICD-10-CM | POA: Diagnosis not present

## 2015-06-24 DIAGNOSIS — T451X5A Adverse effect of antineoplastic and immunosuppressive drugs, initial encounter: Secondary | ICD-10-CM | POA: Diagnosis not present

## 2015-06-24 DIAGNOSIS — J929 Pleural plaque without asbestos: Secondary | ICD-10-CM | POA: Diagnosis not present

## 2015-06-24 DIAGNOSIS — Z87891 Personal history of nicotine dependence: Secondary | ICD-10-CM | POA: Diagnosis not present

## 2015-06-24 DIAGNOSIS — J439 Emphysema, unspecified: Secondary | ICD-10-CM | POA: Diagnosis not present

## 2015-06-24 DIAGNOSIS — C049 Malignant neoplasm of floor of mouth, unspecified: Secondary | ICD-10-CM | POA: Diagnosis not present

## 2015-06-24 DIAGNOSIS — N179 Acute kidney failure, unspecified: Secondary | ICD-10-CM | POA: Diagnosis not present

## 2015-06-24 DIAGNOSIS — Z5111 Encounter for antineoplastic chemotherapy: Secondary | ICD-10-CM | POA: Diagnosis not present

## 2015-06-24 DIAGNOSIS — T66XXXA Radiation sickness, unspecified, initial encounter: Secondary | ICD-10-CM | POA: Diagnosis not present

## 2015-06-24 DIAGNOSIS — G629 Polyneuropathy, unspecified: Secondary | ICD-10-CM | POA: Diagnosis not present

## 2015-06-24 DIAGNOSIS — C069 Malignant neoplasm of mouth, unspecified: Secondary | ICD-10-CM | POA: Diagnosis not present

## 2015-07-15 DIAGNOSIS — I10 Essential (primary) hypertension: Secondary | ICD-10-CM | POA: Diagnosis not present

## 2015-07-15 DIAGNOSIS — I5022 Chronic systolic (congestive) heart failure: Secondary | ICD-10-CM | POA: Diagnosis not present

## 2015-07-15 DIAGNOSIS — N529 Male erectile dysfunction, unspecified: Secondary | ICD-10-CM | POA: Diagnosis not present

## 2015-07-15 DIAGNOSIS — I2510 Atherosclerotic heart disease of native coronary artery without angina pectoris: Secondary | ICD-10-CM | POA: Diagnosis not present

## 2015-08-19 DIAGNOSIS — Z6822 Body mass index (BMI) 22.0-22.9, adult: Secondary | ICD-10-CM | POA: Diagnosis not present

## 2015-08-19 DIAGNOSIS — I509 Heart failure, unspecified: Secondary | ICD-10-CM | POA: Diagnosis not present

## 2015-08-19 DIAGNOSIS — F17200 Nicotine dependence, unspecified, uncomplicated: Secondary | ICD-10-CM | POA: Diagnosis not present

## 2015-08-19 DIAGNOSIS — G629 Polyneuropathy, unspecified: Secondary | ICD-10-CM | POA: Diagnosis not present

## 2015-08-19 DIAGNOSIS — C049 Malignant neoplasm of floor of mouth, unspecified: Secondary | ICD-10-CM | POA: Diagnosis not present

## 2015-08-19 DIAGNOSIS — Z79899 Other long term (current) drug therapy: Secondary | ICD-10-CM | POA: Diagnosis not present

## 2015-08-19 DIAGNOSIS — Z87891 Personal history of nicotine dependence: Secondary | ICD-10-CM | POA: Diagnosis not present

## 2015-08-19 DIAGNOSIS — C040 Malignant neoplasm of anterior floor of mouth: Secondary | ICD-10-CM | POA: Diagnosis not present

## 2015-10-28 DIAGNOSIS — C049 Malignant neoplasm of floor of mouth, unspecified: Secondary | ICD-10-CM | POA: Diagnosis not present

## 2015-10-28 DIAGNOSIS — G629 Polyneuropathy, unspecified: Secondary | ICD-10-CM | POA: Diagnosis not present

## 2015-10-28 DIAGNOSIS — N179 Acute kidney failure, unspecified: Secondary | ICD-10-CM | POA: Diagnosis not present

## 2015-10-28 DIAGNOSIS — J439 Emphysema, unspecified: Secondary | ICD-10-CM | POA: Diagnosis not present

## 2015-10-28 DIAGNOSIS — T66XXXA Radiation sickness, unspecified, initial encounter: Secondary | ICD-10-CM | POA: Diagnosis not present

## 2015-10-28 DIAGNOSIS — Z6822 Body mass index (BMI) 22.0-22.9, adult: Secondary | ICD-10-CM | POA: Diagnosis not present

## 2015-10-28 DIAGNOSIS — T451X5A Adverse effect of antineoplastic and immunosuppressive drugs, initial encounter: Secondary | ICD-10-CM | POA: Diagnosis not present

## 2015-10-28 DIAGNOSIS — Z87891 Personal history of nicotine dependence: Secondary | ICD-10-CM | POA: Diagnosis not present

## 2015-10-28 DIAGNOSIS — K5909 Other constipation: Secondary | ICD-10-CM | POA: Diagnosis not present

## 2015-10-28 DIAGNOSIS — K1230 Oral mucositis (ulcerative), unspecified: Secondary | ICD-10-CM | POA: Diagnosis not present

## 2015-10-28 DIAGNOSIS — M47819 Spondylosis without myelopathy or radiculopathy, site unspecified: Secondary | ICD-10-CM | POA: Diagnosis not present

## 2015-10-28 DIAGNOSIS — I509 Heart failure, unspecified: Secondary | ICD-10-CM | POA: Diagnosis not present

## 2015-10-28 DIAGNOSIS — Z131 Encounter for screening for diabetes mellitus: Secondary | ICD-10-CM | POA: Diagnosis not present

## 2015-10-28 DIAGNOSIS — R599 Enlarged lymph nodes, unspecified: Secondary | ICD-10-CM | POA: Diagnosis not present

## 2015-10-28 DIAGNOSIS — Z5111 Encounter for antineoplastic chemotherapy: Secondary | ICD-10-CM | POA: Diagnosis not present

## 2015-10-28 DIAGNOSIS — J929 Pleural plaque without asbestos: Secondary | ICD-10-CM | POA: Diagnosis not present

## 2015-10-28 DIAGNOSIS — G620 Drug-induced polyneuropathy: Secondary | ICD-10-CM | POA: Diagnosis not present

## 2015-12-16 DIAGNOSIS — M79671 Pain in right foot: Secondary | ICD-10-CM | POA: Diagnosis not present

## 2015-12-16 DIAGNOSIS — L03032 Cellulitis of left toe: Secondary | ICD-10-CM | POA: Diagnosis not present

## 2015-12-16 DIAGNOSIS — M79672 Pain in left foot: Secondary | ICD-10-CM | POA: Diagnosis not present

## 2015-12-20 DIAGNOSIS — L03012 Cellulitis of left finger: Secondary | ICD-10-CM | POA: Diagnosis not present

## 2015-12-20 DIAGNOSIS — Z23 Encounter for immunization: Secondary | ICD-10-CM | POA: Diagnosis not present

## 2015-12-20 DIAGNOSIS — I420 Dilated cardiomyopathy: Secondary | ICD-10-CM | POA: Diagnosis not present

## 2015-12-20 DIAGNOSIS — J449 Chronic obstructive pulmonary disease, unspecified: Secondary | ICD-10-CM | POA: Diagnosis not present

## 2015-12-20 DIAGNOSIS — C069 Malignant neoplasm of mouth, unspecified: Secondary | ICD-10-CM | POA: Diagnosis not present

## 2015-12-20 DIAGNOSIS — Z86010 Personal history of colonic polyps: Secondary | ICD-10-CM | POA: Diagnosis not present

## 2015-12-20 DIAGNOSIS — G620 Drug-induced polyneuropathy: Secondary | ICD-10-CM | POA: Diagnosis not present

## 2015-12-20 DIAGNOSIS — K5900 Constipation, unspecified: Secondary | ICD-10-CM | POA: Diagnosis not present

## 2015-12-27 DIAGNOSIS — M79675 Pain in left toe(s): Secondary | ICD-10-CM | POA: Diagnosis not present

## 2015-12-27 DIAGNOSIS — M79674 Pain in right toe(s): Secondary | ICD-10-CM | POA: Diagnosis not present

## 2015-12-27 DIAGNOSIS — B351 Tinea unguium: Secondary | ICD-10-CM | POA: Diagnosis not present

## 2016-01-13 DIAGNOSIS — Z6821 Body mass index (BMI) 21.0-21.9, adult: Secondary | ICD-10-CM | POA: Diagnosis not present

## 2016-01-13 DIAGNOSIS — C049 Malignant neoplasm of floor of mouth, unspecified: Secondary | ICD-10-CM | POA: Diagnosis not present

## 2016-01-13 DIAGNOSIS — T451X5A Adverse effect of antineoplastic and immunosuppressive drugs, initial encounter: Secondary | ICD-10-CM | POA: Diagnosis not present

## 2016-01-13 DIAGNOSIS — G620 Drug-induced polyneuropathy: Secondary | ICD-10-CM | POA: Diagnosis not present

## 2016-01-20 DIAGNOSIS — I10 Essential (primary) hypertension: Secondary | ICD-10-CM | POA: Diagnosis not present

## 2016-01-20 DIAGNOSIS — Z6821 Body mass index (BMI) 21.0-21.9, adult: Secondary | ICD-10-CM | POA: Diagnosis not present

## 2016-01-20 DIAGNOSIS — I5022 Chronic systolic (congestive) heart failure: Secondary | ICD-10-CM | POA: Diagnosis not present

## 2016-01-24 DIAGNOSIS — L600 Ingrowing nail: Secondary | ICD-10-CM | POA: Diagnosis not present

## 2016-01-24 DIAGNOSIS — B351 Tinea unguium: Secondary | ICD-10-CM | POA: Diagnosis not present

## 2016-03-06 DIAGNOSIS — B351 Tinea unguium: Secondary | ICD-10-CM | POA: Diagnosis not present

## 2016-03-06 DIAGNOSIS — M79674 Pain in right toe(s): Secondary | ICD-10-CM | POA: Diagnosis not present

## 2016-03-06 DIAGNOSIS — M79675 Pain in left toe(s): Secondary | ICD-10-CM | POA: Diagnosis not present

## 2016-03-31 DIAGNOSIS — H25813 Combined forms of age-related cataract, bilateral: Secondary | ICD-10-CM | POA: Diagnosis not present

## 2016-04-03 DIAGNOSIS — B351 Tinea unguium: Secondary | ICD-10-CM | POA: Diagnosis not present

## 2016-04-03 DIAGNOSIS — M79674 Pain in right toe(s): Secondary | ICD-10-CM | POA: Diagnosis not present

## 2016-04-03 DIAGNOSIS — M79675 Pain in left toe(s): Secondary | ICD-10-CM | POA: Diagnosis not present

## 2016-04-20 DIAGNOSIS — I420 Dilated cardiomyopathy: Secondary | ICD-10-CM | POA: Diagnosis not present

## 2016-04-20 DIAGNOSIS — C069 Malignant neoplasm of mouth, unspecified: Secondary | ICD-10-CM | POA: Diagnosis not present

## 2016-04-20 DIAGNOSIS — J449 Chronic obstructive pulmonary disease, unspecified: Secondary | ICD-10-CM | POA: Diagnosis not present

## 2016-04-20 DIAGNOSIS — G620 Drug-induced polyneuropathy: Secondary | ICD-10-CM | POA: Diagnosis not present

## 2016-04-23 DIAGNOSIS — J984 Other disorders of lung: Secondary | ICD-10-CM | POA: Diagnosis not present

## 2016-04-23 DIAGNOSIS — J439 Emphysema, unspecified: Secondary | ICD-10-CM | POA: Diagnosis not present

## 2016-04-23 DIAGNOSIS — C049 Malignant neoplasm of floor of mouth, unspecified: Secondary | ICD-10-CM | POA: Diagnosis not present

## 2016-05-04 DIAGNOSIS — T783XXA Angioneurotic edema, initial encounter: Secondary | ICD-10-CM | POA: Diagnosis not present

## 2016-05-06 DIAGNOSIS — T783XXD Angioneurotic edema, subsequent encounter: Secondary | ICD-10-CM | POA: Diagnosis not present

## 2016-05-07 DIAGNOSIS — M79671 Pain in right foot: Secondary | ICD-10-CM | POA: Diagnosis not present

## 2016-05-07 DIAGNOSIS — B351 Tinea unguium: Secondary | ICD-10-CM | POA: Diagnosis not present

## 2016-05-07 DIAGNOSIS — M79672 Pain in left foot: Secondary | ICD-10-CM | POA: Diagnosis not present

## 2016-06-09 DIAGNOSIS — J3089 Other allergic rhinitis: Secondary | ICD-10-CM | POA: Diagnosis not present

## 2016-06-09 DIAGNOSIS — J309 Allergic rhinitis, unspecified: Secondary | ICD-10-CM | POA: Diagnosis not present

## 2016-06-09 DIAGNOSIS — J449 Chronic obstructive pulmonary disease, unspecified: Secondary | ICD-10-CM | POA: Diagnosis not present

## 2016-06-09 DIAGNOSIS — F17200 Nicotine dependence, unspecified, uncomplicated: Secondary | ICD-10-CM | POA: Diagnosis not present

## 2016-06-09 DIAGNOSIS — T783XXA Angioneurotic edema, initial encounter: Secondary | ICD-10-CM | POA: Diagnosis not present

## 2016-07-01 ENCOUNTER — Encounter: Payer: Self-pay | Admitting: Pulmonary Disease

## 2016-07-01 ENCOUNTER — Ambulatory Visit (INDEPENDENT_AMBULATORY_CARE_PROVIDER_SITE_OTHER): Payer: Medicare Other | Admitting: Pulmonary Disease

## 2016-07-01 ENCOUNTER — Ambulatory Visit (INDEPENDENT_AMBULATORY_CARE_PROVIDER_SITE_OTHER)
Admission: RE | Admit: 2016-07-01 | Discharge: 2016-07-01 | Disposition: A | Discharge status: Home / Self Care | Payer: Medicare Other | Source: Ambulatory Visit | Attending: Pulmonary Disease | Admitting: Pulmonary Disease

## 2016-07-01 VITALS — BP 120/60 | HR 73 | Temp 97.2°F | Ht 72.0 in | Wt 165.2 lb

## 2016-07-01 DIAGNOSIS — J42 Unspecified chronic bronchitis: Secondary | ICD-10-CM

## 2016-07-01 DIAGNOSIS — C049 Malignant neoplasm of floor of mouth, unspecified: Secondary | ICD-10-CM

## 2016-07-01 DIAGNOSIS — I2510 Atherosclerotic heart disease of native coronary artery without angina pectoris: Secondary | ICD-10-CM

## 2016-07-01 DIAGNOSIS — Z720 Tobacco use: Secondary | ICD-10-CM | POA: Diagnosis not present

## 2016-07-01 DIAGNOSIS — I428 Other cardiomyopathies: Secondary | ICD-10-CM

## 2016-07-01 DIAGNOSIS — F17210 Nicotine dependence, cigarettes, uncomplicated: Secondary | ICD-10-CM | POA: Insufficient documentation

## 2016-07-01 DIAGNOSIS — J449 Chronic obstructive pulmonary disease, unspecified: Secondary | ICD-10-CM | POA: Diagnosis not present

## 2016-07-01 DIAGNOSIS — I429 Cardiomyopathy, unspecified: Secondary | ICD-10-CM

## 2016-07-01 MED ORDER — FLUTICASONE FUROATE-VILANTEROL 100-25 MCG/INH IN AEPB: 1.0000 | INHALATION_SPRAY | Freq: Every day | RESPIRATORY_TRACT | 11 refills | Status: DC

## 2016-07-01 MED ORDER — TIOTROPIUM BROMIDE MONOHYDRATE 18 MCG IN CAPS: 18.0000 ug | ORAL_CAPSULE | Freq: Every day | RESPIRATORY_TRACT | 11 refills | Status: DC

## 2016-07-01 NOTE — Patient Instructions (Signed)
Mr. Ouimette-- it was a pleasure meeting you today...  Today we checked a follow up CXR, Spirometry breathing test, and an ambulatory oximetry test...   These tests demonstrated deterioration in your lung function over the past 2 yrs...    The good news is that you DO NOT yet need oxygen for use at home...  By way of treatment to reverse some of the lung deterioration & prevent further damage>>    YOU MUST QUIT ALL SMOKING...    Continue the SPIRIVA once daily...    Add in the new BREO-- one inhalation daily.Marland KitchenMarland Kitchen   I will review your [facility] records & the most recent scan & call you w/ the CXR report when avail  Call me for any questions...  Let's plan a follow up visit in 1 month, sooner if needed for breathing problems.Marland KitchenMarland Kitchen

## 2016-07-01 NOTE — Progress Notes (Signed)
Subjective:     Patient ID: Julian Zimmerman, male   DOB: 19-May-1946, 71 y.o.   MRN: [id number]  HPI   ~  March 25, 2014:  In-patient pulmonary consultation by KC>   REFERRING PHYSICIAN:  Internal Medicine Teaching Service. HISTORY OF PRESENT ILLNESS:  The patient is a very pleasant 70 year old gentleman with very little prior medical care, who I have been asked to see for an abnormal CT chest.  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  He presented to the emergency room with progressive dyspnea on exertion, and a CT angio was done in order to rule out a pulmonary embolus.  This did not show a PET, but did show borderline lymphadenopathy, a small ground-glass opacity in the left lower lobe, pleural thickening with nodularity, right-sided pleural plaques primarily, medially, and finally large low attenuation masses in the pleural space and fissures bilaterally.  The patient has a long history of smoking, and probably has COPD.  He is being treated for a COPD exacerbation.  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  The patient denies any exposure to tuberculosis, but has never had a PPD placed.  He did, however, live in [place] for many years, but never in the [place].  The patient has very little cough and no congestion, but occasionally produces scant purulent mucus.  He has not had excessive weight loss. PAST MEDICAL HISTORY:  Significant for gout as well as arthritis.  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 FAMILY HISTORY:  Significant for diabetes only. SOCIAL HISTORY:  He is married and has worked for [employer], and [employer] as well as [employer].  He  has a history of smoking 1 pack per day for 52 years, but has not smoked since the beginning of the month. REVIEW OF SYSTEMS:  A 10-point review of systems was negative except for that mentioned in history of present illness.  PHYSICAL EXAMINATION:  GENERAL:  He is a well-developed male, in no acute distress. VITAL SIGNS:  Blood pressure 132/86, pulse is 100, respiratory rate 18. He is afebrile.  Oxygen saturation on room air is 92%-94%. HEENT:  Pupils equal, round, reactive to light and accommodation. Extraocular muscles are intact.  Nares are patent without discharge. Oropharynx is clear. NECK:  Without lymphadenopathy or thyromegaly. CHEST:  Very diminished breath sounds throughout, but no true wheezing. There was prominent upper airway pseudo wheezing that resolved with pursed lip breathing. CARDIAC:  Distant heart sounds but regular, 2/6 systolic murmur. ABDOMEN:  Soft, nontender, nondistended.  Good bowel sounds. GENITAL EXAM, RECTAL EXAM< BREAST EXAM:  Not done and not indicated. EXTREMITIES:  Lower extremities with edema noted, no calf tenderness and no cyanosis.  NEUROLOGICAL:  He is alert and oriented and moves all 4 extremities without deficits.  IMPRESSION: 1. Abnormal CT chest with low attenuation masses in the pleural space     bilaterally and especially in the fissures.  It is unclear whether     this is simply loculated fluid, or whether this is an inflammatory     pleural process.  I would find it very unlikely this is a     malignancy,  but certainly cannot exclude.  The patient has had very     little medical care over his life, and therefore no serial chest x-     rays for comparison.  The patient did have a chest x-ray in 2005     that did not showed these abnormalities.  The patient also has a     ground glass opacity in the left lower lobes that may be     inflammatory, but this may be an early malignancy.  He really does     not have significant mediastinal  adenopathy.  It is very difficult     to put all of this together, but the patient does have     granulomatous changes in his liver and spleen, and had lived in     [place] for many years of his life.  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  I would find this to be a very rare presentation     of sarcoidosis, however, nothing can be excluded at this point.  In     terms of planning, I would support following through with a PET     scan, and see where things stand.  At some point he may need     aspiration of one of these low attenuation masses that are abutting the right chest wall.     The pleural process may be nothing to worry about, and just represent loculated fluid.     I suspect none of this is related to his dyspnea on exertion, which     is probably due to COPD. SUGGESTIONS: 1. would place a PPD for completeness => reported NEG 2. Agree with PET scanning => done at [facility]:  CT Chest 04/05/14 showed cardiomegaly, atherosclerotic calcif in Ao & coronaries, +mediastinal adenopathy w/ largest 1.8cm in right lower paratrach region (favored to be reactive), marked centrilob & mild paraseptal emphysema in upper lobes, diffuse bilat GGOs, focal areas of consolidation bilat, mult loculated effusions along the major fissures bilat & along the right minor fissure (pseudotumors), pleural calcif are seen on the right;  Mult punctate calcif in liver & spleen c/w prior granulomatous dis, multilevel DJD in spine...  PET scan 04/05/14 showed intensely hypermetabolic mass involving the right floor of the mouth extending across the midline, mod to intesne FDG avid LNs concerning for mets;  Mild FDG avid mediastinal adenopathy favored to be reactive;;  No FDG avid pulm nodules, loculated pleural effusions, Abd & bones were neg...    ~  April 12, 2014:  Follow up OV by KC>    The patient comes in today after his recent  hospitalization for a COPD exacerbation.  He was discharged on Spiriva alone with Albuterol for rescue, and has done extremely well since that time. He feels that his breathing is adequate, and he rarely requires his rescue inhaler. He denies any significant cough or mucus production. The patient also has a history of a cardiomyopathy, with chronic congestive heart failure and lower extremity edema. He also has a history of an abnormal CT chest with loculated effusions and pleural calcifications. A recent PET scan that [facility] showed no abnormal uptake within his chest. He is getting ready to start chemotherapy for head and neck cancer next week in [facility]. IMP>>  1) Dyspnea:  The patient has significant dyspnea on exertion that I  suspect is related to COPD.  However, he has not had pulmonary function studies, and we will need these for evaluation. He is currently on Spiriva alone, and feels that he is doing extremely well. I will therefore continue him on this regimen. At some point will refer him to pulmonary rehabilitation, but he is getting ready to start chemotherapy for his head and neck cancer next week. 2) Abn CT Chest:  The patient has multiple abnormal findings on CT chest, but his recent PET scan shows no abnormal uptake within the chest. It is unclear whether his abnormal findings are related to asbestos exposure or possibly old granulomatous disease. The patient also has chronic congestive heart failure, and perhaps these are chronically loculated effusions. ADDENDUM>>  Outpt Full PFTs done 05/04/14:  FVC=3.70 (92%), FEV1=2.72 (89%), %1sec=73, mid-flows reduced at 72% predicted; Post bronchodil FEV1 did NOT improve;  TLC=5.94 (82%);  DLCO=39% & DL/VA=49%... He was asked to ret in 3-4 months but he did not do so-- all medical follow up was from [facility]  ENT, Oncology, RAD-ONC, and Cardiology...    ~  July 01, 2016:  50moROV w/ SN>  Julian Zimmerman self-referred because "I want to  stop the Spiriva" - this is his whole agenda for the visit, he is here w/ his wife who helps w/ the history >   I have spent several hours pouring over his Epic record from 2015 and [facility] the [facility]>>    Squamous cell Ca floor of the mouth>  Initial eval in [place] by [facility]baraann [facility] including carbo/taxol/cetux starting in 04/2014, then cetux+XRT from 9/29 - 08/27/14 for total 70Gy;  He is followed regularly (Q627moby ENT-DrWeissler & XRT-DrChera last seen 04/23/16 & no evid of recurrent or metastatic dis found on f/u CT scan;  Thyroid function tests checked periodically & remain wnl as well;  Last seen by DrWeissler 01/13/16-- 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     Chemotherapy induced peripheral neuropathy>  [facility] in hands and feet reported to be improved w/ Cymbalta60/d and Neurontin300Tid (prev on 900Bid but he weaned it on his own) => they signed off 7 indicated that his PCP was taking over this issue...     COPD- mixed chr bronchitis & emphysema (seen on prev CT Chest)>      Cigarette smoker>  He quit transiently during his Chemoradiation but then restarted & currently smoking betw 1/2-1ppd;  He started smoking in teens, has smoked >50 yrs up to 2ppd w/ an est ~80 pack-yr smoking hx overall;  He went through a [facility] smoking cessation program but was unable to quit;  They continue CT Chest Q6m64mor follow up evals...    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 as seen on prev CT scans    HBP, CAD> (atherosclerotic changes seen in ao & coronaries on CT Chest), Cath 03/2014 by DrVaranasi showed 50% midLAD stenosis, small Circ w/ 60% stenosis in large ramus branch, mild dis in dominant RCA=> placed on aggressive medical therapy [facility]cl[facility] seen 01/20/16> on ASA81, MetoprololER50,  Lasix20-just taken prn edema; NOTE: he is very sens to Lisinopril w/ hypotension x2 in past;     Non-ischemic cardiomyopathy> w/ EF=20-25% on 2DEcho 03/2014 assoc w/ mildMR, modTR, PAsys=44m56m  His  initial BNP was >23000;  Follow up studies [facility] showed improvement in EF to 50-55% w/ medical management (per 2DEcho 12/2014)...    Hyperlipidemia> on Atorva40>  ?last FLP 04/06/14 showed TChol 164, HDL 61 at [facility]...     Hx esophageal food impactions requiring EGD & endoscopic food removal by DrOutlaw 1/16 & 3/16 (believed due to eating large food bolus w/o chewing due to his dental issues, no underlying esoph pathology described); dietary adjustments and dental work recommended    Hx alcohol abuse & prot-cal malnutrition>  He has cut back drinking Etoh but still drinks-- serum proteins/ LFTs/ Alb are all wnl now...    Dental problems>  He has dry mouth & uses Biotene...    Anemia>  His last CBC was Jan2016 w/ Hg= 10.3, renal function is wnl    Pt has declined Flu shots> states allergic w/ rash after Flu shotn yrs ago... EXAM shows Afeb, VSS, O2sat=100% on RA at rest; Wt=165#, 6"Tall, BMI=22.4;  HEENT- teeth poor, scarring floor of mouth, indurated neck tissue;  Chest- decr BS bilat, clear w/o w/r/r;  Heart- RR w/o m/r/g;  Abd- soft, nontender, neg;  Ext- VI, no c/c/e;  Neuro- +neuropathy in hands/ feet...   CT Chest w/ [facility]>  Normal heart size, scat coronary calcif, unchanged pericard calcif, no pericardial fluid;  Stable emphysema w/ biapical scarring, unchanged pleural calcif, no new pulm nodules, no adenopathy;  Scattered hepatic & splenic granulomas;  DDD, no suspicious osseous lesions...   CXR 07/01/16>  Norm heart size, min blunting of angles bilat, clear lungs w/ biapical pleural thickening, NAD; prev bilat pseudotumors have resolved...   Spirometry 07/01/16>  FVC=3.55 (86%), FEV1=2.00 (64%), %1sec=56%, mid-flows are reduced at 22% predicted... This represents a 25% drop in  his FEV1 over the last 1yr despite his Spiriva Rx...  Ambulatory Oximetry 07/01/16>  O2sat=99% on RA at rest w/ pulse=73/min;  He ambulated 3 Laps (185' each) w/ lowest O2sat=97% w/ pulse 86/min, no desaturations...  IMP/PLAN>>  FJosph Machoseems annoyed that his pulm function has deteriorated over the last several yrs- despite his continued smoking, the interval treatment for his SCCa of the mouth, etc; this has occurred despite the Spiriva daily during that interval; I told him that he desperately needs to quit all smoking AND w/ need to add an ICS/LABA to his regimen- continue Spiriva daily & add BREO- one inhalation daily; he can still use an Albut rescue inhaler as needed; recall that his FullPFTs in 2015 showed a severe reduction in DLCO c/w emphysema... His whole objective in obtaining this pulmonary f/u visit was to be able to STOP his Spiriva rx and we have instead reinforced the need to quit smoking & added the [facility] he was not pleased w/ these recommendations...     Past Medical History:  Diagnosis Date   COPD/ emphysema    Tobacco abuse >> he continues to smoke 1/2 to 1ppd & has an ~80 pack-yr hx   . Arthritis    "right leg" (03/23/2014)   Gout    HBP    CAD >> nonobstructive CAD on Cath 2015   . Non-ischemic cardiomyopathy >> EF improved from 20-25% in 2015 to 50-55% on Rx   . Hyperlipidemia    . Oral cancer ([place] >> Squamous cell ca of the ant floor of the mouth         s/p Chemotherapy, [facility]    Chemotherapy induced  peripheral neuropathy      Past Surgical History:  Procedure Laterality Date  . ESOPHAGOGASTRODUODENOSCOPY N/A 12/26/2014   Procedure: ESOPHAGOGASTRODUODENOSCOPY (EGD);  Surgeon: Arta Silence, MD;  Location: [facility];  Service: Endoscopy;  Laterality: N/A;  . FLOOR OF MOUTH BIOPSY  03/2014   "found cancer"  . FOREIGN BODY REMOVAL N/A 10/12/2014   Procedure: FOREIGN BODY REMOVAL;  Surgeon: Jeryl Columbia, MD;  Location: [facility]  ENDOSCOPY;  Service: Endoscopy;  Laterality: N/A;  . FOREIGN BODY REMOVAL N/A 12/26/2014   Procedure: FOREIGN BODY REMOVAL;  Surgeon: Arta Silence, MD;  Location: [facility] ENDOSCOPY;  Service: Endoscopy;  Laterality: N/A;  . FRACTIONAL FLOW RESERVE WIRE  03/27/2014   Procedure: FRACTIONAL FLOW RESERVE WIRE;  Surgeon: Jettie Booze, MD;  Location: [facility] CATH LAB;  Service: Cardiovascular;;  . LEFT AND RIGHT HEART CATHETERIZATION WITH CORONARY ANGIOGRAM N/A 03/27/2014   Procedure: LEFT AND RIGHT HEART CATHETERIZATION WITH CORONARY ANGIOGRAM;  Surgeon: Jettie Booze, MD;  Location: [facility] CATH LAB;  Service: Cardiovascular;  Laterality: N/A;  . [place]    Outpatient Encounter Prescriptions as of 07/01/2016  Medication Sig  . ammonium lactate (LAC-HYDRIN) 12 % lotion Apply 1 application topically 2 (two) times daily as needed for dry skin.  [name] aspirin EC 81 MG EC tablet Take 1 tablet (81 mg total) by mouth daily.  [name] atorvastatin (LIPITOR) 40 MG tablet Take 1 tablet (40 mg total) by mouth daily at 6 PM. (Patient taking differently: Take 40 mg by mouth daily. )  . DULoxetine (CYMBALTA) 60 MG capsule Take 60 mg by mouth daily.  . furosemide (LASIX) 20 MG tablet Take 20 mg by mouth daily as needed (swelling in legs or feet).  . gabapentin (NEURONTIN) 300 MG capsule Take 300 mg by mouth 3 (three) times daily.   . metoprolol succinate (TOPROL-XL) 50 MG 24 hr tablet Take 50 mg by mouth daily.  . polyethylene glycol (MIRALAX / GLYCOLAX) packet Take 17 g by mouth daily as needed.  . terbinafine (LAMISIL) 250 MG tablet Take 250 mg by mouth daily.  [name] tiotropium (SPIRIVA HANDIHALER) 18 MCG inhalation capsule Place 1 capsule (18 mcg total) into inhaler and inhale daily.  . vitamin B-12 (CYANOCOBALAMIN) 1000 MCG tablet Take 1,000 mcg by mouth daily.  . [DISCONTINUED] tiotropium (SPIRIVA HANDIHALER) 18 MCG inhalation capsule Place 1 capsule (18 mcg total) into inhaler and inhale daily.   . fluticasone furoate-vilanterol (BREO ELLIPTA) 100-25 MCG/INH AEPB Inhale 1 puff into the lungs daily.  . [DISCONTINUED] albuterol (PROVENTIL HFA;VENTOLIN HFA) 108 (90 BASE) MCG/ACT inhaler Inhale 2 puffs into the lungs every 6 (six) hours as needed for wheezing or shortness of breath. (Patient not taking: Reported on 07/01/2016)  . [DISCONTINUED] albuterol (PROVENTIL) (2.5 MG/3ML) 0.083% nebulizer solution Take 3 mLs (2.5 mg total) by nebulization every 4 (four) hours as needed for wheezing or shortness of breath. (Patient not taking: Reported on 07/01/2016)  . [DISCONTINUED] Calcium-Phosphorus-Vitamin D (CITRACAL +D3 PO) Take 2 tablets by mouth daily.  . [DISCONTINUED] carvedilol (COREG) 6.25 MG tablet Take 1 tablet (6.25 mg total) by mouth 2 (two) times daily with a meal. (Patient not taking: Reported on 07/01/2016)  . [DISCONTINUED] docusate sodium (COLACE) 100 MG capsule Take 100 mg by mouth 2 (two) times daily.  . [DISCONTINUED] hydrALAZINE (APRESOLINE) 10 MG tablet Take 1 tablet (10 mg total) by mouth 3 (three) times daily. (Patient not taking: Reported on 07/01/2016)  . [DISCONTINUED] isosorbide mononitrate (IMDUR) 30 MG 24 hr  tablet Take 1 tablet (30 mg total) by mouth daily. (Patient not taking: Reported on 07/01/2016)  . [DISCONTINUED] Soap & Cleansers (CETAPHIL GENTLE CLEANSER EX) Apply 1 application topically daily. Pt uses as body wash   No facility-administered encounter medications on file as of 07/01/2016.     No Known Allergies   Family History  Problem Relation Age of Onset  . Diabetes Mother   . Diabetes Father     Social History   Social History  . Marital status: Married    Spouse name: N/A  . Number of children: N/A  . Years of education: N/A   Occupational History  . 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  [place] History Main Topics  . Smoking status: Former Smoker    Packs/day: 1.00    Years:  52.00    Types: Cigarettes    Quit date: 03/05/2014  . Smokeless tobacco: Never Used  . Alcohol use 16.8 oz/week    28 Shots of liquor per week     Comment: 03/23/2014 "4, 1oz shots/day"  . Drug use: No  . Sexual activity: Not Currently   Other Topics Concern  . Not on file   Social History Narrative  . No narrative on file    Current Medications, Allergies, Past Medical History, Past Surgical History, Family History, and Social History were reviewed in [employer] record.   Review of Systems             All symptoms NEG except where BOLDED >>  Constitutional:  F/C/S, fatigue, anorexia, unexpected weight change. HEENT:  HA, visual changes, hearing loss, earache, nasal symptoms, sore throat, mouth sores, hoarseness. Resp:  cough, sputum, hemoptysis; SOB, tightness, wheezing. Cardio:  CP, palpit, DOE, orthopnea, edema. GI:  N/V/D/C, blood in stool; reflux, abd pain, distention, gas. GU:  dysuria, freq, urgency, hematuria, flank pain, voiding difficulty. MS:  joint pain, swelling, tenderness, decr ROM; neck pain, back pain, etc. Neuro:  HA, tremors, seizures, dizziness, syncope, weakness, numbness, gait abn. Skin:  suspicious lesions or skin rash. Heme:  adenopathy, bruising, bleeding. Psyche:  confusion, agitation, sleep disturbance, hallucinations, anxiety, depression suicidal.   Objective:   Physical Exam       Vital Signs:  Reviewed...   General:  WD,Thin, 70 y/o BM chr ill appearing, in NAD; alert & oriented; pleasant & cooperative, somewhat anxious... HEENT:  [place]/AT; Conjunctiva- pink, Sclera- nonicteric, EOM-wnl, PERRLA, EACs-wax, TMs-obscured; NOSE-clear; THROAT-clear, no lesions seen. Neck:  Supple w/ fair ROM; no JVD; normal carotid impulses w/o bruits; no thyromegaly or nodules palpated; no lymphadenopathy; indurated thickened tissue Chest:  Decr BS bilat without wheezes, rales, or rhonchi heard. Heart:  Regular Rhythm; norm S1 & S2 without  murmurs, rubs, or gallops detected. Abdomen:  Soft & nontender- no guarding or rebound; normal bowel sounds; no organomegaly or masses palpated, sm umbil hernia. Ext:  Abn posture & unable to flex fingers 345 left hand w/ resting tremor; otherw good ROM, mild arthritic changes, +ven insuffic/ dermatitis, no edema... Neuro:  CNs II-XII intact; motor testing normal; +periph neuropathy, balance off... Derm:  No lesions noted; no rash etc. Lymph:  No cervical, supraclavicular, axillary, or inguinal adenopathy palpated.   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     2) Chemotherapy induced peripheral neuropathy>  [facility] in [facility] and feet reported to be improved w/ Cymbalta60/d and Neurontin300Tid (prev on 900Bid but he weaned it on his own) => they signed off 7 indicated that his PCP was taking over this issue..Marland KitchenMarland Kitchen    3) COPD- mixed chr bronchitis & emphysema (seen on prev CT Chest)>      4) Cigarette smoker>  He quit transiently during his Chemoradiation but then restarted & currently smoking betw 1/2-1ppd;  He started smoking in teens, has smoked >50 yrs up to 2ppd w/ an est ~80 pack-yr smoking hx overall;  He went through a [facility] smoking cessation program but was unable to quit;  They continue CT Chest Q644moor follow up evals...Marland KitchenMarland Kitchen  5) 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 as seen on prev CT scans    6) HBP, CAD> (atherosclerotic  changes seen in ao & coronaries on CT Chest), Cath 03/2014 by DrVaranasi showed 50% midLAD stenosis, small Circ w/ 60% stenosis in large ramus branch, mild dis in dominant RCA=> placed on aggressive medical therapy & [facility] seen 01/[facility]> on ASA81, MetoprololER50, Lasix20-just taken prn edema; NOTE: he is very sens to Lisinopril w/ hypotension x2 in past;     7) Non-ischemic cardiomyopathy> w/ EF=20-25% on 2DEcho 03/2014 assoc w/ mildMR, modTR, PAsys=5274m;  His initial BNP was >23000;  [facility] to 50-55% w/ medical management (per 2DEcho 12/2014)...    8) Hyperlipidemia> on Atorva40>  ?last FLP 04/06/14 [facility] TChol 164, [facility] Marland KitchenMarland Kitchen  9) Hx esophageal food impactions requiring EGD & endoscopic food removal by DrOutlaw 1/16 & 3/16 (believed due to eating large food bolus w/o chewing due to his dental issues, no underlying esoph pathology described); dietary adjustments and dental work recommended    10) Hx alcohol abuse & prot-cal malnutrition>  He has cut back drinking Etoh but still drinks-- serum proteins/ LFTs/ Alb are all wnl now...    11) Dental problems>  He has dry mouth & uses Biotene...    12) Anemia>  His last CBC was Jan2016 w/ Hg= 10.3, renal function is wnl, and   PLAN >>  07/01/16>   FreJosph Machoems annoyed that his pulm function has deteriorated over the last several yrs- despite his continued smoking, the interval treatment for his SCCa of the mouth, etc; this has occurred despite the Spiriva daily during that interval; I told him that he desperately needs to quit all smoking AND w/ need to add an ICS/LABA to his regimen- continue Spiriva daily & add BREO- one inhalation daily; he can still use an Albut rescue inhaler as needed; recall that his FullPFTs in 2015 showed a severe reduction in DLCO c/w emphysema... His whole objective in obtaining this pulmonary f/u visit was to be able to STOP his Spiriva rx and we have  instead reinforced the need to quit smoking & added the  Breo, he was not pleased w/ these recommendations     Plan:     Patient's Medications  New Prescriptions   FLUTICASONE FUROATE-VILANTEROL (BREO ELLIPTA) 100-25 MCG/INH AEPB    Inhale 1 puff into the lungs daily.  Previous Medications   AMMONIUM LACTATE (LAC-HYDRIN) 12 % LOTION    Apply 1 application topically 2 (two) times daily as needed for dry skin.   ASPIRIN EC 81 MG EC TABLET    Take 1 tablet (81 mg total) by mouth daily.   ATORVASTATIN (LIPITOR) 40 MG TABLET    Take 1 tablet (40 mg total) by mouth daily at 6 PM.   DULOXETINE (CYMBALTA) 60 MG CAPSULE    Take 60 mg by mouth daily.   FUROSEMIDE (LASIX) 20 MG TABLET    Take 20 mg by mouth daily as needed (swelling in legs or feet).   GABAPENTIN (NEURONTIN) 300 MG CAPSULE    Take 300 mg by mouth 3 (three) times daily.    METOPROLOL SUCCINATE (TOPROL-XL) 50 MG 24 HR TABLET    Take 50 mg by mouth daily.   POLYETHYLENE GLYCOL (MIRALAX / GLYCOLAX) PACKET    Take 17 g by mouth daily as needed.   TERBINAFINE (LAMISIL) 250 MG TABLET    Take 250 mg by mouth daily.   VITAMIN B-12 (CYANOCOBALAMIN) 1000 MCG TABLET    Take 1,000 mcg by mouth daily.  Modified Medications   Modified Medication Previous Medication   TIOTROPIUM (SPIRIVA HANDIHALER) 18 MCG INHALATION CAPSULE tiotropium (SPIRIVA HANDIHALER) 18 MCG inhalation capsule      Place 1 capsule (18 mcg total) into inhaler and inhale daily.    Place 1 capsule (18 mcg total) into inhaler and inhale daily.  Discontinued Medications   ALBUTEROL (PROVENTIL HFA;VENTOLIN HFA) 108 (90 BASE) MCG/ACT INHALER    Inhale 2 puffs into the lungs every 6 (six) hours as needed for wheezing or shortness of breath.   ALBUTEROL (PROVENTIL) (2.5 MG/3ML) 0.083% NEBULIZER SOLUTION    Take 3 mLs (2.5 mg total) by nebulization every 4 (four) hours as needed for wheezing or shortness of breath.   CALCIUM-PHOSPHORUS-VITAMIN D (CITRACAL +D3 PO)    Take 2 tablets by mouth  daily.   CARVEDILOL (COREG) 6.25 MG TABLET    Take 1 tablet (6.25 mg total) by mouth 2 (two) times daily with a meal.   DOCUSATE SODIUM (COLACE) 100 MG CAPSULE    Take 100 mg by mouth 2 (two) times daily.   HYDRALAZINE (APRESOLINE) 10 MG TABLET    Take 1 tablet (10 mg total) by mouth 3 (three) times daily.   ISOSORBIDE MONONITRATE (IMDUR) 30 MG 24 HR TABLET    Take 1 tablet (30 mg total) by mouth daily.   SOAP & CLEANSERS (CETAPHIL GENTLE CLEANSER EX)    Apply 1 application topically daily. Pt uses as body wash

## 2016-07-10 ENCOUNTER — Telehealth: Payer: Self-pay | Admitting: Pulmonary Disease

## 2016-07-10 DIAGNOSIS — G6282 Radiation-induced polyneuropathy: Secondary | ICD-10-CM

## 2016-07-10 NOTE — Telephone Encounter (Signed)
Called and spoke with pts wife and she is aware of referral that has been placed.   

## 2016-07-10 NOTE — Telephone Encounter (Signed)
SN pts wife is calling to see if you would be willing to place a referral for pt to see neurologist?  Please advise. thanks

## 2016-07-22 DIAGNOSIS — I5022 Chronic systolic (congestive) heart failure: Secondary | ICD-10-CM | POA: Diagnosis not present

## 2016-07-22 DIAGNOSIS — I10 Essential (primary) hypertension: Secondary | ICD-10-CM | POA: Diagnosis not present

## 2016-07-22 DIAGNOSIS — I2510 Atherosclerotic heart disease of native coronary artery without angina pectoris: Secondary | ICD-10-CM | POA: Diagnosis not present

## 2016-07-22 DIAGNOSIS — Z6823 Body mass index (BMI) 23.0-23.9, adult: Secondary | ICD-10-CM | POA: Diagnosis not present

## 2016-07-27 ENCOUNTER — Ambulatory Visit: Payer: Medicare Other | Admitting: Pulmonary Disease

## 2016-07-30 DIAGNOSIS — L600 Ingrowing nail: Secondary | ICD-10-CM | POA: Diagnosis not present

## 2016-08-12 DIAGNOSIS — H6123 Impacted cerumen, bilateral: Secondary | ICD-10-CM | POA: Diagnosis not present

## 2016-08-12 DIAGNOSIS — Z6823 Body mass index (BMI) 23.0-23.9, adult: Secondary | ICD-10-CM | POA: Diagnosis not present

## 2016-08-12 DIAGNOSIS — C049 Malignant neoplasm of floor of mouth, unspecified: Secondary | ICD-10-CM | POA: Diagnosis not present

## 2016-08-13 DIAGNOSIS — L03032 Cellulitis of left toe: Secondary | ICD-10-CM | POA: Diagnosis not present

## 2016-09-01 ENCOUNTER — Ambulatory Visit (INDEPENDENT_AMBULATORY_CARE_PROVIDER_SITE_OTHER): Payer: Medicare Other | Admitting: Neurology

## 2016-09-01 ENCOUNTER — Encounter: Payer: Self-pay | Admitting: Neurology

## 2016-09-01 ENCOUNTER — Other Ambulatory Visit (INDEPENDENT_AMBULATORY_CARE_PROVIDER_SITE_OTHER): Payer: Medicare Other

## 2016-09-01 VITALS — BP 120/70 | HR 79 | Ht 72.0 in | Wt 171.6 lb

## 2016-09-01 DIAGNOSIS — G250 Essential tremor: Secondary | ICD-10-CM

## 2016-09-01 DIAGNOSIS — I2510 Atherosclerotic heart disease of native coronary artery without angina pectoris: Secondary | ICD-10-CM

## 2016-09-01 DIAGNOSIS — T451X5A Adverse effect of antineoplastic and immunosuppressive drugs, initial encounter: Secondary | ICD-10-CM | POA: Diagnosis not present

## 2016-09-01 DIAGNOSIS — G620 Drug-induced polyneuropathy: Secondary | ICD-10-CM

## 2016-09-01 LAB — VITAMIN B12

## 2016-09-01 MED ORDER — DULOXETINE HCL 60 MG PO CPEP: 60.0000 mg | ORAL_CAPSULE | Freq: Every day | ORAL | 5 refills | Status: DC

## 2016-09-01 NOTE — Patient Instructions (Addendum)
1.  Reduce gabapentin as follows:  Gabapentin 300 mg tablets    Morning       Afternoon        Evening  Week 1 1 tab            Skip               1 tab              Week 2 Skip                   1 tab              Week 3 Stop gabapentin         If your pain gets worse, you can restart at the lower dose of gabapentin that controlled symptoms.  If you develop increased sleepiness, stay at the lower dose.           2.  Continue Cymbalta 60mg  daily  3.  Check blood work  4.  Stop smoking  Return to clinic in 6 months

## 2016-09-01 NOTE — Progress Notes (Signed)
[facility] Clinic Note - Initial Visit   Date: 09/01/16  Julian Zimmerman MRN: [id number] DOB: 03-18-1946   Dear Dr. Lenna Gilford:  Thank you for your kind referral of Julian Zimmerman for consultation of tremor and neuropathy. Although his history is well known to you, please allow [place] to reiterate it for the purpose of our medical record. The patient was accompanied to the clinic by wife who also provides collateral information.     History of Present Illness: Julian Zimmerman is a 70 y.o. 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    He was diagnosed with squamous cell carcinoma in 2015 and treated with chemotherapy and radiation.  When he started chemotherapy, he developed burning and tingling pain of the hands and feet.  Over the the years, the discomfort of the hands have improved.  He has not had any improvement with neuropathy affecting the feet and his wife reports that he is very sensitive even to light touch.  He currently takes gabapentin 300mg  TID and Cymbalta 60mg  daily.  He would like to reduce the dose because he does not have the pain as severe as previously.  He has history of alcoholism for 12 years and used to drink fifth of whiskey every 3 days.  He has been sober since 2015.  He has no personal history of diabetes.  No family history of neuropathy.  His wife also has noticed his hands can tremor when he is doing certain tasks, such as reaching for things.  It can be present at rest also.  No family history of tremor.  His tremor does not bother him or interfere with his day-to-day activities.  He denies any slowed movements or stiffness.  He walk unassisted and does endorse some imbalance.  Out-side paper records, electronic medical record, and images have been reviewed  where available and summarized as:  Labs 04/2016:  TSH normal  Past Medical History:  Diagnosis Date  . Arthritis    "right leg" (03/23/2014)  . Chronic systolic congestive heart failure ([place])   . Gout    "right leg" (03/23/2014)  . HTN (hypertension)   . Oral cancer ([place])   . Tobacco abuse     Past Surgical History:  Procedure Laterality Date  . ESOPHAGOGASTRODUODENOSCOPY N/A 12/26/2014   Procedure: ESOPHAGOGASTRODUODENOSCOPY (EGD);  Surgeon: Arta Silence, MD;  Location: [facility];  Service: Endoscopy;  Laterality: N/A;  . FLOOR OF MOUTH BIOPSY  03/2014   "found cancer"  . FOREIGN BODY REMOVAL N/A 10/12/2014   Procedure: FOREIGN BODY REMOVAL;  Surgeon: Jeryl Columbia, MD;  Location: [facility] ENDOSCOPY;  Service: Endoscopy;  Laterality: N/A;  . FOREIGN BODY REMOVAL N/A 12/26/2014   Procedure: FOREIGN BODY REMOVAL;  Surgeon: Arta Silence, MD;  Location: [facility] ENDOSCOPY;  Service: Endoscopy;  Laterality: N/A;  . FRACTIONAL FLOW RESERVE WIRE  03/27/2014   Procedure: FRACTIONAL FLOW RESERVE WIRE;  Surgeon: Jettie Booze, MD;  Location: [facility] CATH LAB;  Service: Cardiovascular;;  . LEFT AND RIGHT HEART CATHETERIZATION WITH CORONARY ANGIOGRAM N/A 03/27/2014   Procedure: LEFT AND RIGHT HEART CATHETERIZATION WITH CORONARY ANGIOGRAM;  Surgeon: Jettie Booze, MD;  Location: [facility] CATH LAB;  Service: Cardiovascular;  Laterality: N/A;  . TONSILLECTOMY AND ADENOIDECTOMY  1955     Medications:  Outpatient Encounter Prescriptions as of 09/01/2016  Medication Sig Note  .  ammonium lactate (LAC-HYDRIN) 12 % lotion Apply 1 application topically 2 (two) times daily as needed for dry skin.   [name] aspirin EC 81 MG EC tablet Take 1 tablet (81 mg total) by mouth daily.   [name] atorvastatin (LIPITOR) 40 MG tablet Take 1 tablet (40 mg total) by mouth daily at 6 PM. (Patient taking differently: Take 40 mg by mouth daily. )   . bisacodyl (DULCOLAX) 5 MG EC tablet Take 5 mg by mouth. 09/01/2016: Received from: [place]: Take 5 mg by mouth daily as needed for constipation.  . carbamide peroxide (EAR DROPS) 6.5 % otic solution Administer 5 drops to the right ear Two (2) times a day. 09/01/2016: Received from: [facility]  . DULoxetine (CYMBALTA) 60 MG capsule Take 1 capsule (60 mg total) by mouth daily.   . furosemide (LASIX) 20 MG tablet Take 20 mg by mouth daily as needed (swelling in legs or feet).   . gabapentin (NEURONTIN) 300 MG capsule Take 300 mg by mouth 3 (three) times daily.    . metoprolol succinate (TOPROL-XL) 50 MG 24 hr tablet Take 50 mg by mouth daily.   . polyethylene glycol (MIRALAX / GLYCOLAX) packet Take 17 g by mouth daily as needed.   . terbinafine (LAMISIL) 250 MG tablet Take 250 mg by mouth daily.   [name] tiotropium (SPIRIVA HANDIHALER) 18 MCG inhalation capsule Place 1 capsule (18 mcg total) into inhaler and inhale daily.   . vitamin B-12 (CYANOCOBALAMIN) 1000 MCG tablet Take 1,000 mcg by mouth daily.   . [DISCONTINUED] DULoxetine (CYMBALTA) 60 MG capsule Take 60 mg by mouth daily.   . [DISCONTINUED] fluticasone furoate-vilanterol (BREO ELLIPTA) 100-25 MCG/INH AEPB Inhale 1 puff into the lungs daily.    No facility-administered encounter medications on file as of 09/01/2016.      Allergies: No Known Allergies  Family History: Family History  Problem Relation Age of Onset  . Diabetes Mother   . Diabetes Father     Social History: Social History  Substance Use Topics  . Smoking status: Former Smoker    Packs/day: 1.00    Years: 52.00    Types: Cigarettes    Quit date: 03/05/2014  . Smokeless tobacco: Never Used  . Alcohol use 16.8 oz/week    28 Shots of liquor per week     Comment: 03/23/2014 "4, 1oz shots/day"   Social History   Social History Narrative   Lives with wife in a 3 story home.  Has 3 children.  Retired Librarian, academic.  Education: college.     Review of Systems:  CONSTITUTIONAL: No fevers, chills, night sweats, or weight loss.   EYES: No  visual changes or eye pain ENT: No hearing changes.  No history of nose bleeds.   RESPIRATORY: No cough, wheezing and shortness of breath.   CARDIOVASCULAR: Negative for chest pain, and palpitations.   GI: Negative for abdominal discomfort, blood in stools or black stools.  No recent change in bowel habits.   GU:  No history of incontinence.   MUSCLOSKELETAL: No history of joint pain or swelling.  No myalgias.   SKIN: Negative for lesions, rash, and itching.   HEMATOLOGY/ONCOLOGY: Negative for prolonged bleeding, bruising easily, and swollen nodes.  +history of cancer.   ENDOCRINE: Negative for cold or heat intolerance, polydipsia or goiter.   PSYCH:  No depression or anxiety symptoms.   NEURO: As Above.   Vital Signs:  BP 120/70   Pulse 79  Ht 6' (1.829 m)   Wt 171 lb 9 oz (77.8 kg)   SpO2 99%   BMI 23.27 kg/m  Pain Scale: 0 on a scale of 0-10   General Medical Exam:   General:  Well appearing, comfortable.   Eyes/ENT: see cranial nerve examination.  Very poor dentition Neck: No masses appreciated.  Full range of motion without tenderness.  No carotid bruits. Respiratory:  Clear to auscultation, good air entry bilaterally.   Cardiac:  Regular rate and rhythm, no murmur.   Extremities:  No deformities, edema, or skin discoloration.  Skin:  No rashes or lesions.  Neurological Exam: MENTAL STATUS including orientation to time, place, person, recent and remote memory, attention span and concentration, language, and fund of knowledge is normal.  Speech is not dysarthric.  CRANIAL NERVES: II:  No visual field defects.  Unremarkable fundi.   III-IV-VI: Pupils equal round and reactive to light.  Normal conjugate, extra-ocular eye movements in all directions of gaze.  No nystagmus.  No ptosis.   V:  Normal facial sensation.     VII:  Normal facial symmetry and movements.  No pathologic facial reflexes.  VIII:  Normal hearing and vestibular function.   IX-X:  Normal palatal  movement.   XI:  Normal shoulder shrug and head rotation.   XII:  Normal tongue strength and range of motion, no deviation or fasciculation.  MOTOR:  Instrinsic hand atrophy.  No fasciculations.  There is a mild and intermittent thumb tremor at rest which is distractible. Hand tremor is provoked with finger to nose testing at end position.  No pronator drift.  Tone is normal.    Right Upper Extremity:    Left Upper Extremity:    Deltoid  5/5   Deltoid  5/5   Biceps  5/5   Biceps  5/5   Triceps  5/5   Triceps  5/5   Wrist extensors  5/5   Wrist extensors  5/5   Wrist flexors  5/5   Wrist flexors  5/5   Finger extensors  5-/5   Finger extensors  5-/5   Finger flexors  5-/5   Finger flexors  5-/5   Dorsal interossei  4/5   Dorsal interossei  4/5   Abductor pollicis  5-/5   Abductor pollicis  5-/5   Tone (Ashworth scale)  0  Tone (Ashworth scale)  0   Right Lower Extremity:    Left Lower Extremity:    Hip flexors  5/5   Hip flexors  5/5   Hip extensors  5/5   Hip extensors  5/5   Knee flexors  5/5   Knee flexors  5/5   Knee extensors  5/5   Knee extensors  5/5   Dorsiflexors  5/5   Dorsiflexors  5/5   Plantarflexors  5/5   Plantarflexors  5/5   Toe extensors  5/5   Toe extensors  5/5   Toe flexors  5/5   Toe flexors  5/5   Tone (Ashworth scale)  0  Tone (Ashworth scale)  0   MSRs:  Right                                                                 Left brachioradialis 2+  brachioradialis 2+  biceps 2+  biceps 2+  triceps 2+  triceps 2+  patellar 1+  Patellar 1+  ankle jerk 0  ankle jerk 0  Hoffman no  Hoffman no  plantar response down  plantar response down   SENSORY:  Reduced pin prick, temperature, and vibration distal to ankles bilaterally.  There is marked sway with Rhomberg testing.  COORDINATION/GAIT: Normal finger-to- nose-finger.  There is a intention tremor of the hands with finger to nose testing. Intact rapid alternating movements bilaterally.  Able to rise from a  chair without using arms.  Gait wide based and stable.  Arm swing is reduced bilaterally. He can walk on heels and toes, but has difficulty with tandem gait.   IMPRESSION: 1.  Chemotherapy-induced neuropathy affecting the feet.  He also has some weakness of the hands which may be due to neuropathy or C8 radiculopathy.  He currently denies any neck pain or hand paresthesias.  If he develops worsening paresthesias or weakness, NCS/EMG will be the next step.  At this time, I will check  vitamin B12, vitamin B1, copper for other treatable causes for neuropathy.  With his history of alcoholism, I believe some component to his neuropathy of the feet, may be due to chronic effects of alcohol.  Fortunately, he has been sober since being diagnosed with cancer in 2015. He would like to reduce his gabapentin, because his paresthesias are not bothersome.  This is reasonable and I have provided him with instructions how to taper his gabapentin by 300mg /week.  Continue Cymbalta 60mg  daily. Fall precautions were stressed and I encouraged him to start using a cane, especially on uneven surfaces  2.  Essential tremor of the hands.  There is mild and intermittent resting tremor, but signs of bradykinesias or rigidity to suggest parkinsonism, but I will continue to follow him clinically for any signs of this.  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  3.  Tobacco use.  Smoking cessation instruction/counseling given:  counseled patient on the dangers of tobacco use, advised patient to stop smoking, and reviewed strategies to maximize success  Return to clinic in 6 months.   The duration of this appointment visit was 60 minutes of face-to-face time with the patient.  Greater than 50% of this time was spent in counseling, explanation of diagnosis, planning of further management, and coordination of care.   Thank you for allowing me to participate in patient's care.  If I can answer any additional questions, I would  be pleased to do so.    Sincerely,    Julian K. Posey Pronto, DO

## 2016-09-03 DIAGNOSIS — T8189XA Other complications of procedures, not elsewhere classified, initial encounter: Secondary | ICD-10-CM | POA: Diagnosis not present

## 2016-09-03 LAB — COPPER, SERUM: COPPER: 201 ug/dL — AB (ref 72–166)

## 2016-09-05 LAB — VITAMIN B1: VITAMIN B1 (THIAMINE): 10 nmol/L (ref 8–30)

## 2016-09-14 ENCOUNTER — Other Ambulatory Visit: Payer: Self-pay | Admitting: *Deleted

## 2016-09-14 ENCOUNTER — Other Ambulatory Visit: Payer: Medicare Other

## 2016-09-14 DIAGNOSIS — R790 Abnormal level of blood mineral: Secondary | ICD-10-CM

## 2016-09-16 LAB — CERULOPLASMIN: CERULOPLASMIN: 46 mg/dL — AB (ref 18–36)

## 2016-09-23 ENCOUNTER — Other Ambulatory Visit: Payer: Self-pay | Admitting: *Deleted

## 2016-09-23 ENCOUNTER — Telehealth: Payer: Self-pay | Admitting: Neurology

## 2016-09-23 DIAGNOSIS — R109 Unspecified abdominal pain: Secondary | ICD-10-CM

## 2016-09-23 NOTE — Telephone Encounter (Signed)
Entered in error

## 2016-09-25 ENCOUNTER — Ambulatory Visit: Payer: Medicare Other | Admitting: Neurology

## 2016-10-08 ENCOUNTER — Telehealth: Payer: Self-pay | Admitting: Neurology

## 2016-10-08 NOTE — Telephone Encounter (Signed)
PT's wife Neoma Laming called to say he had some oral surgery and that is why he has not been in for his bloodwork, he has not felt like it/Dawn CB# [phone]

## 2016-10-09 DIAGNOSIS — H11151 Pinguecula, right eye: Secondary | ICD-10-CM | POA: Diagnosis not present

## 2016-10-09 DIAGNOSIS — H04123 Dry eye syndrome of bilateral lacrimal glands: Secondary | ICD-10-CM | POA: Diagnosis not present

## 2016-10-09 DIAGNOSIS — H52223 Regular astigmatism, bilateral: Secondary | ICD-10-CM | POA: Diagnosis not present

## 2016-10-09 DIAGNOSIS — H25813 Combined forms of age-related cataract, bilateral: Secondary | ICD-10-CM | POA: Diagnosis not present

## 2016-10-09 DIAGNOSIS — H524 Presbyopia: Secondary | ICD-10-CM | POA: Diagnosis not present

## 2016-10-09 DIAGNOSIS — H5213 Myopia, bilateral: Secondary | ICD-10-CM | POA: Diagnosis not present

## 2016-10-09 NOTE — Telephone Encounter (Signed)
Noted  

## 2016-10-09 NOTE — Telephone Encounter (Signed)
FYI

## 2016-10-12 DIAGNOSIS — L603 Nail dystrophy: Secondary | ICD-10-CM | POA: Diagnosis not present

## 2016-10-12 DIAGNOSIS — I739 Peripheral vascular disease, unspecified: Secondary | ICD-10-CM | POA: Diagnosis not present

## 2016-10-26 DIAGNOSIS — R946 Abnormal results of thyroid function studies: Secondary | ICD-10-CM | POA: Diagnosis not present

## 2016-10-26 DIAGNOSIS — Z6823 Body mass index (BMI) 23.0-23.9, adult: Secondary | ICD-10-CM | POA: Diagnosis not present

## 2016-10-26 DIAGNOSIS — R918 Other nonspecific abnormal finding of lung field: Secondary | ICD-10-CM | POA: Diagnosis not present

## 2016-10-26 DIAGNOSIS — C049 Malignant neoplasm of floor of mouth, unspecified: Secondary | ICD-10-CM | POA: Diagnosis not present

## 2016-10-26 DIAGNOSIS — Z87891 Personal history of nicotine dependence: Secondary | ICD-10-CM | POA: Diagnosis not present

## 2016-10-26 DIAGNOSIS — J439 Emphysema, unspecified: Secondary | ICD-10-CM | POA: Diagnosis not present

## 2016-11-05 DIAGNOSIS — C049 Malignant neoplasm of floor of mouth, unspecified: Secondary | ICD-10-CM | POA: Diagnosis not present

## 2016-11-05 DIAGNOSIS — R6884 Jaw pain: Secondary | ICD-10-CM | POA: Diagnosis not present

## 2016-11-11 ENCOUNTER — Encounter (HOSPITAL_BASED_OUTPATIENT_CLINIC_OR_DEPARTMENT_OTHER): Payer: Medicare Other | Attending: Surgery

## 2016-11-11 DIAGNOSIS — J449 Chronic obstructive pulmonary disease, unspecified: Secondary | ICD-10-CM | POA: Insufficient documentation

## 2016-11-11 DIAGNOSIS — Z9221 Personal history of antineoplastic chemotherapy: Secondary | ICD-10-CM | POA: Insufficient documentation

## 2016-11-11 DIAGNOSIS — I2510 Atherosclerotic heart disease of native coronary artery without angina pectoris: Secondary | ICD-10-CM | POA: Insufficient documentation

## 2016-11-11 DIAGNOSIS — Z85818 Personal history of malignant neoplasm of other sites of lip, oral cavity, and pharynx: Secondary | ICD-10-CM | POA: Diagnosis not present

## 2016-11-11 DIAGNOSIS — Z923 Personal history of irradiation: Secondary | ICD-10-CM | POA: Insufficient documentation

## 2016-11-11 DIAGNOSIS — M272 Inflammatory conditions of jaws: Secondary | ICD-10-CM | POA: Diagnosis not present

## 2016-11-11 DIAGNOSIS — L598 Other specified disorders of the skin and subcutaneous tissue related to radiation: Secondary | ICD-10-CM | POA: Diagnosis not present

## 2016-11-11 DIAGNOSIS — Y842 Radiological procedure and radiotherapy as the cause of abnormal reaction of the patient, or of later complication, without mention of misadventure at the time of the procedure: Secondary | ICD-10-CM | POA: Diagnosis not present

## 2016-11-11 DIAGNOSIS — F17210 Nicotine dependence, cigarettes, uncomplicated: Secondary | ICD-10-CM | POA: Insufficient documentation

## 2016-11-11 DIAGNOSIS — I5022 Chronic systolic (congestive) heart failure: Secondary | ICD-10-CM | POA: Insufficient documentation

## 2016-11-18 DIAGNOSIS — J449 Chronic obstructive pulmonary disease, unspecified: Secondary | ICD-10-CM | POA: Diagnosis not present

## 2016-11-18 DIAGNOSIS — Z85818 Personal history of malignant neoplasm of other sites of lip, oral cavity, and pharynx: Secondary | ICD-10-CM | POA: Diagnosis not present

## 2016-11-18 DIAGNOSIS — M272 Inflammatory conditions of jaws: Secondary | ICD-10-CM | POA: Diagnosis not present

## 2016-11-18 DIAGNOSIS — I5022 Chronic systolic (congestive) heart failure: Secondary | ICD-10-CM | POA: Diagnosis not present

## 2016-11-18 DIAGNOSIS — F17210 Nicotine dependence, cigarettes, uncomplicated: Secondary | ICD-10-CM | POA: Diagnosis not present

## 2016-11-18 DIAGNOSIS — I2510 Atherosclerotic heart disease of native coronary artery without angina pectoris: Secondary | ICD-10-CM | POA: Diagnosis not present

## 2016-11-18 DIAGNOSIS — L598 Other specified disorders of the skin and subcutaneous tissue related to radiation: Secondary | ICD-10-CM | POA: Diagnosis not present

## 2016-11-27 DIAGNOSIS — M272 Inflammatory conditions of jaws: Secondary | ICD-10-CM | POA: Diagnosis not present

## 2016-11-27 DIAGNOSIS — L598 Other specified disorders of the skin and subcutaneous tissue related to radiation: Secondary | ICD-10-CM | POA: Diagnosis not present

## 2016-11-27 DIAGNOSIS — F17210 Nicotine dependence, cigarettes, uncomplicated: Secondary | ICD-10-CM | POA: Diagnosis not present

## 2016-11-27 DIAGNOSIS — J449 Chronic obstructive pulmonary disease, unspecified: Secondary | ICD-10-CM | POA: Diagnosis not present

## 2016-11-27 DIAGNOSIS — Z85818 Personal history of malignant neoplasm of other sites of lip, oral cavity, and pharynx: Secondary | ICD-10-CM | POA: Diagnosis not present

## 2016-11-27 DIAGNOSIS — I2510 Atherosclerotic heart disease of native coronary artery without angina pectoris: Secondary | ICD-10-CM | POA: Diagnosis not present

## 2016-11-27 DIAGNOSIS — I5022 Chronic systolic (congestive) heart failure: Secondary | ICD-10-CM | POA: Diagnosis not present

## 2016-11-30 DIAGNOSIS — Z85818 Personal history of malignant neoplasm of other sites of lip, oral cavity, and pharynx: Secondary | ICD-10-CM | POA: Diagnosis not present

## 2016-11-30 DIAGNOSIS — L598 Other specified disorders of the skin and subcutaneous tissue related to radiation: Secondary | ICD-10-CM | POA: Diagnosis not present

## 2016-11-30 DIAGNOSIS — I2510 Atherosclerotic heart disease of native coronary artery without angina pectoris: Secondary | ICD-10-CM | POA: Diagnosis not present

## 2016-11-30 DIAGNOSIS — I5022 Chronic systolic (congestive) heart failure: Secondary | ICD-10-CM | POA: Diagnosis not present

## 2016-11-30 DIAGNOSIS — J449 Chronic obstructive pulmonary disease, unspecified: Secondary | ICD-10-CM | POA: Diagnosis not present

## 2016-11-30 DIAGNOSIS — M272 Inflammatory conditions of jaws: Secondary | ICD-10-CM | POA: Diagnosis not present

## 2016-11-30 DIAGNOSIS — F17210 Nicotine dependence, cigarettes, uncomplicated: Secondary | ICD-10-CM | POA: Diagnosis not present

## 2016-12-01 DIAGNOSIS — J449 Chronic obstructive pulmonary disease, unspecified: Secondary | ICD-10-CM | POA: Diagnosis not present

## 2016-12-01 DIAGNOSIS — M272 Inflammatory conditions of jaws: Secondary | ICD-10-CM | POA: Diagnosis not present

## 2016-12-01 DIAGNOSIS — F17210 Nicotine dependence, cigarettes, uncomplicated: Secondary | ICD-10-CM | POA: Diagnosis not present

## 2016-12-01 DIAGNOSIS — L598 Other specified disorders of the skin and subcutaneous tissue related to radiation: Secondary | ICD-10-CM | POA: Diagnosis not present

## 2016-12-01 DIAGNOSIS — Z85818 Personal history of malignant neoplasm of other sites of lip, oral cavity, and pharynx: Secondary | ICD-10-CM | POA: Diagnosis not present

## 2016-12-01 DIAGNOSIS — I2510 Atherosclerotic heart disease of native coronary artery without angina pectoris: Secondary | ICD-10-CM | POA: Diagnosis not present

## 2016-12-01 DIAGNOSIS — I5022 Chronic systolic (congestive) heart failure: Secondary | ICD-10-CM | POA: Diagnosis not present

## 2016-12-02 DIAGNOSIS — I2510 Atherosclerotic heart disease of native coronary artery without angina pectoris: Secondary | ICD-10-CM | POA: Diagnosis not present

## 2016-12-02 DIAGNOSIS — J449 Chronic obstructive pulmonary disease, unspecified: Secondary | ICD-10-CM | POA: Diagnosis not present

## 2016-12-02 DIAGNOSIS — F17210 Nicotine dependence, cigarettes, uncomplicated: Secondary | ICD-10-CM | POA: Diagnosis not present

## 2016-12-02 DIAGNOSIS — I5022 Chronic systolic (congestive) heart failure: Secondary | ICD-10-CM | POA: Diagnosis not present

## 2016-12-02 DIAGNOSIS — Z85818 Personal history of malignant neoplasm of other sites of lip, oral cavity, and pharynx: Secondary | ICD-10-CM | POA: Diagnosis not present

## 2016-12-02 DIAGNOSIS — M272 Inflammatory conditions of jaws: Secondary | ICD-10-CM | POA: Diagnosis not present

## 2016-12-02 DIAGNOSIS — L598 Other specified disorders of the skin and subcutaneous tissue related to radiation: Secondary | ICD-10-CM | POA: Diagnosis not present

## 2016-12-03 ENCOUNTER — Encounter (HOSPITAL_BASED_OUTPATIENT_CLINIC_OR_DEPARTMENT_OTHER): Payer: Medicare Other | Attending: Internal Medicine

## 2016-12-03 DIAGNOSIS — Z923 Personal history of irradiation: Secondary | ICD-10-CM | POA: Diagnosis not present

## 2016-12-03 DIAGNOSIS — Y842 Radiological procedure and radiotherapy as the cause of abnormal reaction of the patient, or of later complication, without mention of misadventure at the time of the procedure: Secondary | ICD-10-CM | POA: Diagnosis not present

## 2016-12-03 DIAGNOSIS — M272 Inflammatory conditions of jaws: Secondary | ICD-10-CM | POA: Insufficient documentation

## 2016-12-03 DIAGNOSIS — Z85818 Personal history of malignant neoplasm of other sites of lip, oral cavity, and pharynx: Secondary | ICD-10-CM | POA: Diagnosis not present

## 2016-12-03 DIAGNOSIS — L598 Other specified disorders of the skin and subcutaneous tissue related to radiation: Secondary | ICD-10-CM | POA: Diagnosis not present

## 2016-12-03 DIAGNOSIS — Z9221 Personal history of antineoplastic chemotherapy: Secondary | ICD-10-CM | POA: Insufficient documentation

## 2016-12-04 DIAGNOSIS — Z85818 Personal history of malignant neoplasm of other sites of lip, oral cavity, and pharynx: Secondary | ICD-10-CM | POA: Diagnosis not present

## 2016-12-04 DIAGNOSIS — Z9221 Personal history of antineoplastic chemotherapy: Secondary | ICD-10-CM | POA: Diagnosis not present

## 2016-12-04 DIAGNOSIS — Z923 Personal history of irradiation: Secondary | ICD-10-CM | POA: Diagnosis not present

## 2016-12-04 DIAGNOSIS — M272 Inflammatory conditions of jaws: Secondary | ICD-10-CM | POA: Diagnosis not present

## 2016-12-04 DIAGNOSIS — L598 Other specified disorders of the skin and subcutaneous tissue related to radiation: Secondary | ICD-10-CM | POA: Diagnosis not present

## 2016-12-07 DIAGNOSIS — Z9221 Personal history of antineoplastic chemotherapy: Secondary | ICD-10-CM | POA: Diagnosis not present

## 2016-12-07 DIAGNOSIS — Z85818 Personal history of malignant neoplasm of other sites of lip, oral cavity, and pharynx: Secondary | ICD-10-CM | POA: Diagnosis not present

## 2016-12-07 DIAGNOSIS — Z923 Personal history of irradiation: Secondary | ICD-10-CM | POA: Diagnosis not present

## 2016-12-07 DIAGNOSIS — M272 Inflammatory conditions of jaws: Secondary | ICD-10-CM | POA: Diagnosis not present

## 2016-12-07 DIAGNOSIS — L598 Other specified disorders of the skin and subcutaneous tissue related to radiation: Secondary | ICD-10-CM | POA: Diagnosis not present

## 2016-12-08 DIAGNOSIS — Z85818 Personal history of malignant neoplasm of other sites of lip, oral cavity, and pharynx: Secondary | ICD-10-CM | POA: Diagnosis not present

## 2016-12-08 DIAGNOSIS — M272 Inflammatory conditions of jaws: Secondary | ICD-10-CM | POA: Diagnosis not present

## 2016-12-08 DIAGNOSIS — L598 Other specified disorders of the skin and subcutaneous tissue related to radiation: Secondary | ICD-10-CM | POA: Diagnosis not present

## 2016-12-08 DIAGNOSIS — Z923 Personal history of irradiation: Secondary | ICD-10-CM | POA: Diagnosis not present

## 2016-12-08 DIAGNOSIS — Z9221 Personal history of antineoplastic chemotherapy: Secondary | ICD-10-CM | POA: Diagnosis not present

## 2016-12-09 DIAGNOSIS — M272 Inflammatory conditions of jaws: Secondary | ICD-10-CM | POA: Diagnosis not present

## 2016-12-09 DIAGNOSIS — Z923 Personal history of irradiation: Secondary | ICD-10-CM | POA: Diagnosis not present

## 2016-12-09 DIAGNOSIS — Z85818 Personal history of malignant neoplasm of other sites of lip, oral cavity, and pharynx: Secondary | ICD-10-CM | POA: Diagnosis not present

## 2016-12-09 DIAGNOSIS — Z9221 Personal history of antineoplastic chemotherapy: Secondary | ICD-10-CM | POA: Diagnosis not present

## 2016-12-09 DIAGNOSIS — L598 Other specified disorders of the skin and subcutaneous tissue related to radiation: Secondary | ICD-10-CM | POA: Diagnosis not present

## 2016-12-10 DIAGNOSIS — L598 Other specified disorders of the skin and subcutaneous tissue related to radiation: Secondary | ICD-10-CM | POA: Diagnosis not present

## 2016-12-10 DIAGNOSIS — Z85818 Personal history of malignant neoplasm of other sites of lip, oral cavity, and pharynx: Secondary | ICD-10-CM | POA: Diagnosis not present

## 2016-12-10 DIAGNOSIS — Z9221 Personal history of antineoplastic chemotherapy: Secondary | ICD-10-CM | POA: Diagnosis not present

## 2016-12-10 DIAGNOSIS — M272 Inflammatory conditions of jaws: Secondary | ICD-10-CM | POA: Diagnosis not present

## 2016-12-10 DIAGNOSIS — Z923 Personal history of irradiation: Secondary | ICD-10-CM | POA: Diagnosis not present

## 2016-12-11 DIAGNOSIS — M272 Inflammatory conditions of jaws: Secondary | ICD-10-CM | POA: Diagnosis not present

## 2016-12-11 DIAGNOSIS — L598 Other specified disorders of the skin and subcutaneous tissue related to radiation: Secondary | ICD-10-CM | POA: Diagnosis not present

## 2016-12-11 DIAGNOSIS — Z85818 Personal history of malignant neoplasm of other sites of lip, oral cavity, and pharynx: Secondary | ICD-10-CM | POA: Diagnosis not present

## 2016-12-11 DIAGNOSIS — Z923 Personal history of irradiation: Secondary | ICD-10-CM | POA: Diagnosis not present

## 2016-12-11 DIAGNOSIS — Z9221 Personal history of antineoplastic chemotherapy: Secondary | ICD-10-CM | POA: Diagnosis not present

## 2016-12-14 DIAGNOSIS — L598 Other specified disorders of the skin and subcutaneous tissue related to radiation: Secondary | ICD-10-CM | POA: Diagnosis not present

## 2016-12-14 DIAGNOSIS — Z923 Personal history of irradiation: Secondary | ICD-10-CM | POA: Diagnosis not present

## 2016-12-14 DIAGNOSIS — M272 Inflammatory conditions of jaws: Secondary | ICD-10-CM | POA: Diagnosis not present

## 2016-12-14 DIAGNOSIS — Z85818 Personal history of malignant neoplasm of other sites of lip, oral cavity, and pharynx: Secondary | ICD-10-CM | POA: Diagnosis not present

## 2016-12-14 DIAGNOSIS — Z9221 Personal history of antineoplastic chemotherapy: Secondary | ICD-10-CM | POA: Diagnosis not present

## 2016-12-15 DIAGNOSIS — L598 Other specified disorders of the skin and subcutaneous tissue related to radiation: Secondary | ICD-10-CM | POA: Diagnosis not present

## 2016-12-15 DIAGNOSIS — M272 Inflammatory conditions of jaws: Secondary | ICD-10-CM | POA: Diagnosis not present

## 2016-12-15 DIAGNOSIS — Z923 Personal history of irradiation: Secondary | ICD-10-CM | POA: Diagnosis not present

## 2016-12-15 DIAGNOSIS — Z85818 Personal history of malignant neoplasm of other sites of lip, oral cavity, and pharynx: Secondary | ICD-10-CM | POA: Diagnosis not present

## 2016-12-15 DIAGNOSIS — Z9221 Personal history of antineoplastic chemotherapy: Secondary | ICD-10-CM | POA: Diagnosis not present

## 2016-12-16 DIAGNOSIS — M272 Inflammatory conditions of jaws: Secondary | ICD-10-CM | POA: Diagnosis not present

## 2016-12-16 DIAGNOSIS — Z923 Personal history of irradiation: Secondary | ICD-10-CM | POA: Diagnosis not present

## 2016-12-16 DIAGNOSIS — L598 Other specified disorders of the skin and subcutaneous tissue related to radiation: Secondary | ICD-10-CM | POA: Diagnosis not present

## 2016-12-16 DIAGNOSIS — Z85818 Personal history of malignant neoplasm of other sites of lip, oral cavity, and pharynx: Secondary | ICD-10-CM | POA: Diagnosis not present

## 2016-12-16 DIAGNOSIS — Z9221 Personal history of antineoplastic chemotherapy: Secondary | ICD-10-CM | POA: Diagnosis not present

## 2016-12-17 DIAGNOSIS — L598 Other specified disorders of the skin and subcutaneous tissue related to radiation: Secondary | ICD-10-CM | POA: Diagnosis not present

## 2016-12-17 DIAGNOSIS — Z923 Personal history of irradiation: Secondary | ICD-10-CM | POA: Diagnosis not present

## 2016-12-17 DIAGNOSIS — Z85818 Personal history of malignant neoplasm of other sites of lip, oral cavity, and pharynx: Secondary | ICD-10-CM | POA: Diagnosis not present

## 2016-12-17 DIAGNOSIS — M272 Inflammatory conditions of jaws: Secondary | ICD-10-CM | POA: Diagnosis not present

## 2016-12-17 DIAGNOSIS — Z9221 Personal history of antineoplastic chemotherapy: Secondary | ICD-10-CM | POA: Diagnosis not present

## 2016-12-18 ENCOUNTER — Telehealth: Payer: Self-pay | Admitting: Pulmonary Disease

## 2016-12-18 DIAGNOSIS — Z85818 Personal history of malignant neoplasm of other sites of lip, oral cavity, and pharynx: Secondary | ICD-10-CM | POA: Diagnosis not present

## 2016-12-18 DIAGNOSIS — Z923 Personal history of irradiation: Secondary | ICD-10-CM | POA: Diagnosis not present

## 2016-12-18 DIAGNOSIS — L598 Other specified disorders of the skin and subcutaneous tissue related to radiation: Secondary | ICD-10-CM | POA: Diagnosis not present

## 2016-12-18 DIAGNOSIS — Z9221 Personal history of antineoplastic chemotherapy: Secondary | ICD-10-CM | POA: Diagnosis not present

## 2016-12-18 DIAGNOSIS — M272 Inflammatory conditions of jaws: Secondary | ICD-10-CM | POA: Diagnosis not present

## 2016-12-18 MED ORDER — TIOTROPIUM BROMIDE MONOHYDRATE 18 MCG IN CAPS: 18.0000 ug | ORAL_CAPSULE | Freq: Every day | RESPIRATORY_TRACT | 3 refills | Status: DC

## 2016-12-18 NOTE — Telephone Encounter (Signed)
Spoke with pt's wife, Neoma Laming. They are wanting to start getting the pt's Spiriva in [place] due to the cost. She requests to have a prescription mailed to them for this. Rx has been placed in the mail. Nothing further was needed.

## 2016-12-21 DIAGNOSIS — M272 Inflammatory conditions of jaws: Secondary | ICD-10-CM | POA: Diagnosis not present

## 2016-12-21 DIAGNOSIS — L598 Other specified disorders of the skin and subcutaneous tissue related to radiation: Secondary | ICD-10-CM | POA: Diagnosis not present

## 2016-12-21 DIAGNOSIS — Z923 Personal history of irradiation: Secondary | ICD-10-CM | POA: Diagnosis not present

## 2016-12-21 DIAGNOSIS — Z85818 Personal history of malignant neoplasm of other sites of lip, oral cavity, and pharynx: Secondary | ICD-10-CM | POA: Diagnosis not present

## 2016-12-21 DIAGNOSIS — Z9221 Personal history of antineoplastic chemotherapy: Secondary | ICD-10-CM | POA: Diagnosis not present

## 2016-12-22 DIAGNOSIS — Z9221 Personal history of antineoplastic chemotherapy: Secondary | ICD-10-CM | POA: Diagnosis not present

## 2016-12-22 DIAGNOSIS — L598 Other specified disorders of the skin and subcutaneous tissue related to radiation: Secondary | ICD-10-CM | POA: Diagnosis not present

## 2016-12-22 DIAGNOSIS — M272 Inflammatory conditions of jaws: Secondary | ICD-10-CM | POA: Diagnosis not present

## 2016-12-22 DIAGNOSIS — Z85818 Personal history of malignant neoplasm of other sites of lip, oral cavity, and pharynx: Secondary | ICD-10-CM | POA: Diagnosis not present

## 2016-12-22 DIAGNOSIS — Z923 Personal history of irradiation: Secondary | ICD-10-CM | POA: Diagnosis not present

## 2016-12-23 DIAGNOSIS — M272 Inflammatory conditions of jaws: Secondary | ICD-10-CM | POA: Diagnosis not present

## 2016-12-23 DIAGNOSIS — Z923 Personal history of irradiation: Secondary | ICD-10-CM | POA: Diagnosis not present

## 2016-12-23 DIAGNOSIS — Z85818 Personal history of malignant neoplasm of other sites of lip, oral cavity, and pharynx: Secondary | ICD-10-CM | POA: Diagnosis not present

## 2016-12-23 DIAGNOSIS — L598 Other specified disorders of the skin and subcutaneous tissue related to radiation: Secondary | ICD-10-CM | POA: Diagnosis not present

## 2016-12-23 DIAGNOSIS — Z9221 Personal history of antineoplastic chemotherapy: Secondary | ICD-10-CM | POA: Diagnosis not present

## 2016-12-24 DIAGNOSIS — Z9221 Personal history of antineoplastic chemotherapy: Secondary | ICD-10-CM | POA: Diagnosis not present

## 2016-12-24 DIAGNOSIS — Z85818 Personal history of malignant neoplasm of other sites of lip, oral cavity, and pharynx: Secondary | ICD-10-CM | POA: Diagnosis not present

## 2016-12-24 DIAGNOSIS — Z923 Personal history of irradiation: Secondary | ICD-10-CM | POA: Diagnosis not present

## 2016-12-24 DIAGNOSIS — L598 Other specified disorders of the skin and subcutaneous tissue related to radiation: Secondary | ICD-10-CM | POA: Diagnosis not present

## 2016-12-24 DIAGNOSIS — M272 Inflammatory conditions of jaws: Secondary | ICD-10-CM | POA: Diagnosis not present

## 2016-12-25 DIAGNOSIS — Z923 Personal history of irradiation: Secondary | ICD-10-CM | POA: Diagnosis not present

## 2016-12-25 DIAGNOSIS — Z9221 Personal history of antineoplastic chemotherapy: Secondary | ICD-10-CM | POA: Diagnosis not present

## 2016-12-25 DIAGNOSIS — L598 Other specified disorders of the skin and subcutaneous tissue related to radiation: Secondary | ICD-10-CM | POA: Diagnosis not present

## 2016-12-25 DIAGNOSIS — M272 Inflammatory conditions of jaws: Secondary | ICD-10-CM | POA: Diagnosis not present

## 2016-12-25 DIAGNOSIS — Z85818 Personal history of malignant neoplasm of other sites of lip, oral cavity, and pharynx: Secondary | ICD-10-CM | POA: Diagnosis not present

## 2016-12-28 DIAGNOSIS — M272 Inflammatory conditions of jaws: Secondary | ICD-10-CM | POA: Diagnosis not present

## 2016-12-28 DIAGNOSIS — L598 Other specified disorders of the skin and subcutaneous tissue related to radiation: Secondary | ICD-10-CM | POA: Diagnosis not present

## 2016-12-28 DIAGNOSIS — Z9221 Personal history of antineoplastic chemotherapy: Secondary | ICD-10-CM | POA: Diagnosis not present

## 2016-12-28 DIAGNOSIS — Z85818 Personal history of malignant neoplasm of other sites of lip, oral cavity, and pharynx: Secondary | ICD-10-CM | POA: Diagnosis not present

## 2016-12-28 DIAGNOSIS — Z923 Personal history of irradiation: Secondary | ICD-10-CM | POA: Diagnosis not present

## 2016-12-29 DIAGNOSIS — M272 Inflammatory conditions of jaws: Secondary | ICD-10-CM | POA: Diagnosis not present

## 2016-12-29 DIAGNOSIS — L598 Other specified disorders of the skin and subcutaneous tissue related to radiation: Secondary | ICD-10-CM | POA: Diagnosis not present

## 2016-12-29 DIAGNOSIS — Z923 Personal history of irradiation: Secondary | ICD-10-CM | POA: Diagnosis not present

## 2016-12-29 DIAGNOSIS — Z85818 Personal history of malignant neoplasm of other sites of lip, oral cavity, and pharynx: Secondary | ICD-10-CM | POA: Diagnosis not present

## 2016-12-29 DIAGNOSIS — Z9221 Personal history of antineoplastic chemotherapy: Secondary | ICD-10-CM | POA: Diagnosis not present

## 2016-12-30 DIAGNOSIS — Z9221 Personal history of antineoplastic chemotherapy: Secondary | ICD-10-CM | POA: Diagnosis not present

## 2016-12-30 DIAGNOSIS — Z923 Personal history of irradiation: Secondary | ICD-10-CM | POA: Diagnosis not present

## 2016-12-30 DIAGNOSIS — Z85818 Personal history of malignant neoplasm of other sites of lip, oral cavity, and pharynx: Secondary | ICD-10-CM | POA: Diagnosis not present

## 2016-12-30 DIAGNOSIS — M272 Inflammatory conditions of jaws: Secondary | ICD-10-CM | POA: Diagnosis not present

## 2016-12-30 DIAGNOSIS — L598 Other specified disorders of the skin and subcutaneous tissue related to radiation: Secondary | ICD-10-CM | POA: Diagnosis not present

## 2016-12-31 DIAGNOSIS — M272 Inflammatory conditions of jaws: Secondary | ICD-10-CM | POA: Diagnosis not present

## 2016-12-31 DIAGNOSIS — Z9221 Personal history of antineoplastic chemotherapy: Secondary | ICD-10-CM | POA: Diagnosis not present

## 2016-12-31 DIAGNOSIS — Z923 Personal history of irradiation: Secondary | ICD-10-CM | POA: Diagnosis not present

## 2016-12-31 DIAGNOSIS — L598 Other specified disorders of the skin and subcutaneous tissue related to radiation: Secondary | ICD-10-CM | POA: Diagnosis not present

## 2016-12-31 DIAGNOSIS — Z85818 Personal history of malignant neoplasm of other sites of lip, oral cavity, and pharynx: Secondary | ICD-10-CM | POA: Diagnosis not present

## 2017-01-01 DIAGNOSIS — Z923 Personal history of irradiation: Secondary | ICD-10-CM | POA: Diagnosis not present

## 2017-01-01 DIAGNOSIS — Z9221 Personal history of antineoplastic chemotherapy: Secondary | ICD-10-CM | POA: Diagnosis not present

## 2017-01-01 DIAGNOSIS — Z85818 Personal history of malignant neoplasm of other sites of lip, oral cavity, and pharynx: Secondary | ICD-10-CM | POA: Diagnosis not present

## 2017-01-01 DIAGNOSIS — M272 Inflammatory conditions of jaws: Secondary | ICD-10-CM | POA: Diagnosis not present

## 2017-01-01 DIAGNOSIS — L598 Other specified disorders of the skin and subcutaneous tissue related to radiation: Secondary | ICD-10-CM | POA: Diagnosis not present

## 2017-01-04 ENCOUNTER — Encounter (HOSPITAL_BASED_OUTPATIENT_CLINIC_OR_DEPARTMENT_OTHER): Payer: Medicare Other | Attending: Internal Medicine

## 2017-01-04 DIAGNOSIS — C049 Malignant neoplasm of floor of mouth, unspecified: Secondary | ICD-10-CM | POA: Diagnosis not present

## 2017-01-04 DIAGNOSIS — Y842 Radiological procedure and radiotherapy as the cause of abnormal reaction of the patient, or of later complication, without mention of misadventure at the time of the procedure: Secondary | ICD-10-CM | POA: Insufficient documentation

## 2017-01-04 DIAGNOSIS — H9190 Unspecified hearing loss, unspecified ear: Secondary | ICD-10-CM | POA: Diagnosis not present

## 2017-01-04 DIAGNOSIS — Z6823 Body mass index (BMI) 23.0-23.9, adult: Secondary | ICD-10-CM | POA: Diagnosis not present

## 2017-01-04 DIAGNOSIS — Z923 Personal history of irradiation: Secondary | ICD-10-CM | POA: Insufficient documentation

## 2017-01-04 DIAGNOSIS — Z85819 Personal history of malignant neoplasm of unspecified site of lip, oral cavity, and pharynx: Secondary | ICD-10-CM | POA: Insufficient documentation

## 2017-01-04 DIAGNOSIS — M272 Inflammatory conditions of jaws: Secondary | ICD-10-CM | POA: Insufficient documentation

## 2017-01-07 DIAGNOSIS — H524 Presbyopia: Secondary | ICD-10-CM | POA: Diagnosis not present

## 2017-01-07 DIAGNOSIS — H11151 Pinguecula, right eye: Secondary | ICD-10-CM | POA: Diagnosis not present

## 2017-01-07 DIAGNOSIS — H04123 Dry eye syndrome of bilateral lacrimal glands: Secondary | ICD-10-CM | POA: Diagnosis not present

## 2017-01-07 DIAGNOSIS — H25813 Combined forms of age-related cataract, bilateral: Secondary | ICD-10-CM | POA: Diagnosis not present

## 2017-01-12 DIAGNOSIS — M272 Inflammatory conditions of jaws: Secondary | ICD-10-CM | POA: Diagnosis not present

## 2017-01-12 DIAGNOSIS — Z85819 Personal history of malignant neoplasm of unspecified site of lip, oral cavity, and pharynx: Secondary | ICD-10-CM | POA: Diagnosis not present

## 2017-01-12 DIAGNOSIS — Z923 Personal history of irradiation: Secondary | ICD-10-CM | POA: Diagnosis not present

## 2017-01-12 DIAGNOSIS — L598 Other specified disorders of the skin and subcutaneous tissue related to radiation: Secondary | ICD-10-CM | POA: Diagnosis not present

## 2017-01-12 DIAGNOSIS — Y842 Radiological procedure and radiotherapy as the cause of abnormal reaction of the patient, or of later complication, without mention of misadventure at the time of the procedure: Secondary | ICD-10-CM | POA: Diagnosis not present

## 2017-01-13 DIAGNOSIS — Z85819 Personal history of malignant neoplasm of unspecified site of lip, oral cavity, and pharynx: Secondary | ICD-10-CM | POA: Diagnosis not present

## 2017-01-13 DIAGNOSIS — Z923 Personal history of irradiation: Secondary | ICD-10-CM | POA: Diagnosis not present

## 2017-01-13 DIAGNOSIS — L598 Other specified disorders of the skin and subcutaneous tissue related to radiation: Secondary | ICD-10-CM | POA: Diagnosis not present

## 2017-01-13 DIAGNOSIS — M272 Inflammatory conditions of jaws: Secondary | ICD-10-CM | POA: Diagnosis not present

## 2017-01-14 DIAGNOSIS — M272 Inflammatory conditions of jaws: Secondary | ICD-10-CM | POA: Diagnosis not present

## 2017-01-14 DIAGNOSIS — L598 Other specified disorders of the skin and subcutaneous tissue related to radiation: Secondary | ICD-10-CM | POA: Diagnosis not present

## 2017-01-14 DIAGNOSIS — Z923 Personal history of irradiation: Secondary | ICD-10-CM | POA: Diagnosis not present

## 2017-01-14 DIAGNOSIS — Z85819 Personal history of malignant neoplasm of unspecified site of lip, oral cavity, and pharynx: Secondary | ICD-10-CM | POA: Diagnosis not present

## 2017-01-15 DIAGNOSIS — Z923 Personal history of irradiation: Secondary | ICD-10-CM | POA: Diagnosis not present

## 2017-01-15 DIAGNOSIS — M272 Inflammatory conditions of jaws: Secondary | ICD-10-CM | POA: Diagnosis not present

## 2017-01-15 DIAGNOSIS — L598 Other specified disorders of the skin and subcutaneous tissue related to radiation: Secondary | ICD-10-CM | POA: Diagnosis not present

## 2017-01-15 DIAGNOSIS — Z85819 Personal history of malignant neoplasm of unspecified site of lip, oral cavity, and pharynx: Secondary | ICD-10-CM | POA: Diagnosis not present

## 2017-02-03 ENCOUNTER — Encounter (HOSPITAL_BASED_OUTPATIENT_CLINIC_OR_DEPARTMENT_OTHER): Payer: Medicare Other | Attending: Internal Medicine

## 2017-02-03 DIAGNOSIS — M272 Inflammatory conditions of jaws: Secondary | ICD-10-CM | POA: Diagnosis not present

## 2017-02-03 DIAGNOSIS — L598 Other specified disorders of the skin and subcutaneous tissue related to radiation: Secondary | ICD-10-CM | POA: Diagnosis not present

## 2017-02-04 DIAGNOSIS — L598 Other specified disorders of the skin and subcutaneous tissue related to radiation: Secondary | ICD-10-CM | POA: Diagnosis not present

## 2017-02-04 DIAGNOSIS — M272 Inflammatory conditions of jaws: Secondary | ICD-10-CM | POA: Diagnosis not present

## 2017-02-05 DIAGNOSIS — L598 Other specified disorders of the skin and subcutaneous tissue related to radiation: Secondary | ICD-10-CM | POA: Diagnosis not present

## 2017-02-05 DIAGNOSIS — M272 Inflammatory conditions of jaws: Secondary | ICD-10-CM | POA: Diagnosis not present

## 2017-02-08 ENCOUNTER — Encounter: Payer: Self-pay | Admitting: Pulmonary Disease

## 2017-02-08 ENCOUNTER — Ambulatory Visit (INDEPENDENT_AMBULATORY_CARE_PROVIDER_SITE_OTHER)
Admission: RE | Admit: 2017-02-08 | Discharge: 2017-02-08 | Disposition: A | Discharge status: Home / Self Care | Payer: Medicare Other | Source: Ambulatory Visit | Attending: Pulmonary Disease | Admitting: Pulmonary Disease

## 2017-02-08 ENCOUNTER — Ambulatory Visit (INDEPENDENT_AMBULATORY_CARE_PROVIDER_SITE_OTHER): Payer: Medicare Other | Admitting: Pulmonary Disease

## 2017-02-08 ENCOUNTER — Telehealth: Payer: Self-pay | Admitting: Pulmonary Disease

## 2017-02-08 ENCOUNTER — Other Ambulatory Visit (INDEPENDENT_AMBULATORY_CARE_PROVIDER_SITE_OTHER): Payer: Medicare Other

## 2017-02-08 VITALS — BP 104/62 | HR 100 | Temp 99.9°F | Resp 18 | Ht 72.0 in | Wt 161.0 lb

## 2017-02-08 DIAGNOSIS — M879 Osteonecrosis, unspecified: Secondary | ICD-10-CM

## 2017-02-08 DIAGNOSIS — I2510 Atherosclerotic heart disease of native coronary artery without angina pectoris: Secondary | ICD-10-CM

## 2017-02-08 DIAGNOSIS — C049 Malignant neoplasm of floor of mouth, unspecified: Secondary | ICD-10-CM

## 2017-02-08 DIAGNOSIS — M8708 Idiopathic aseptic necrosis of bone, other site: Secondary | ICD-10-CM | POA: Diagnosis not present

## 2017-02-08 DIAGNOSIS — R0602 Shortness of breath: Secondary | ICD-10-CM | POA: Diagnosis not present

## 2017-02-08 DIAGNOSIS — J449 Chronic obstructive pulmonary disease, unspecified: Secondary | ICD-10-CM | POA: Diagnosis not present

## 2017-02-08 DIAGNOSIS — R5383 Other fatigue: Secondary | ICD-10-CM

## 2017-02-08 DIAGNOSIS — R042 Hemoptysis: Secondary | ICD-10-CM | POA: Diagnosis not present

## 2017-02-08 DIAGNOSIS — I428 Other cardiomyopathies: Secondary | ICD-10-CM

## 2017-02-08 DIAGNOSIS — R05 Cough: Secondary | ICD-10-CM | POA: Diagnosis not present

## 2017-02-08 DIAGNOSIS — G620 Drug-induced polyneuropathy: Secondary | ICD-10-CM | POA: Diagnosis not present

## 2017-02-08 DIAGNOSIS — F17210 Nicotine dependence, cigarettes, uncomplicated: Secondary | ICD-10-CM | POA: Diagnosis not present

## 2017-02-08 DIAGNOSIS — R269 Unspecified abnormalities of gait and mobility: Secondary | ICD-10-CM | POA: Diagnosis not present

## 2017-02-08 DIAGNOSIS — T451X5A Adverse effect of antineoplastic and immunosuppressive drugs, initial encounter: Secondary | ICD-10-CM

## 2017-02-08 LAB — PROTIME-INR
INR: 1.2 ratio — AB (ref 0.8–1.0)
PROTHROMBIN TIME: 12.6 s (ref 9.6–13.1)

## 2017-02-08 LAB — COMPREHENSIVE METABOLIC PANEL
ALBUMIN: 4.1 g/dL (ref 3.5–5.2)
ALK PHOS: 76 U/L (ref 39–117)
ALT: 17 U/L (ref 0–53)
AST: 18 U/L (ref 0–37)
BILIRUBIN TOTAL: 0.4 mg/dL (ref 0.2–1.2)
BUN: 22 mg/dL (ref 6–23)
CO2: 25 mEq/L (ref 19–32)
Calcium: 9.1 mg/dL (ref 8.4–10.5)
Chloride: 107 mEq/L (ref 96–112)
Creatinine, Ser: 1.06 mg/dL (ref 0.40–1.50)
GFR: 88.63 mL/min (ref >60.00)
Glucose, Bld: 123 mg/dL — ABNORMAL HIGH (ref 70–99)
POTASSIUM: 4.2 meq/L (ref 3.5–5.1)
SODIUM: 139 meq/L (ref 135–145)
TOTAL PROTEIN: 8.1 g/dL (ref 6.0–8.3)

## 2017-02-08 LAB — CBC WITH DIFFERENTIAL/PLATELET
BASOS ABS: 0 10*3/uL (ref 0.0–0.1)
Basophils Relative: 0.5 % (ref 0.0–3.0)
EOS ABS: 0 10*3/uL (ref 0.0–0.7)
EOS PCT: 0.3 % (ref 0.0–5.0)
HCT: 39.7 % (ref 39.0–52.0)
HEMOGLOBIN: 12.9 g/dL — AB (ref 13.0–17.0)
Lymphocytes Relative: 27.5 % (ref 12.0–46.0)
Lymphs Abs: 1.1 10*3/uL (ref 0.7–4.0)
MCHC: 32.5 g/dL (ref 30.0–36.0)
MCV: 83.4 fl (ref 78.0–100.0)
MONO ABS: 0.7 10*3/uL (ref 0.1–1.0)
Monocytes Relative: 16.1 % — ABNORMAL HIGH (ref 3.0–12.0)
NEUTROS PCT: 55.6 % (ref 43.0–77.0)
Neutro Abs: 2.3 10*3/uL (ref 1.4–7.7)
Platelets: 177 10*3/uL (ref 150.0–400.0)
RBC: 4.76 Mil/uL (ref 4.22–5.81)
RDW: 14.7 % (ref 11.5–15.5)
WBC: 4.1 10*3/uL (ref 4.0–10.5)

## 2017-02-08 LAB — APTT: aPTT: 30.1 s (ref 23.4–32.7)

## 2017-02-08 LAB — BRAIN NATRIURETIC PEPTIDE: PRO B NATRI PEPTIDE: 26 pg/mL (ref 0.0–100.0)

## 2017-02-08 LAB — TSH: TSH: 5.91 u[IU]/mL — AB (ref 0.35–4.50)

## 2017-02-08 LAB — SEDIMENTATION RATE: SED RATE: 99 mm/h — AB (ref 0–20)

## 2017-02-08 MED ORDER — METHYLPREDNISOLONE ACETATE 80 MG/ML IJ SUSP
80.0000 mg | Freq: Once | INTRAMUSCULAR | Status: AC
  Administered 2017-02-08: 80 mg via INTRAMUSCULAR

## 2017-02-08 MED ORDER — METHYLPREDNISOLONE 4 MG PO TBPK: ORAL_TABLET | ORAL | 0 refills | Status: DC

## 2017-02-08 MED ORDER — LEVOFLOXACIN 500 MG PO TABS: 500.0000 mg | ORAL_TABLET | Freq: Every day | ORAL | 0 refills | Status: DC

## 2017-02-08 NOTE — Progress Notes (Signed)
Subjective:     Patient ID: Julian Zimmerman, male   DOB: 1945-10-11, 71 y.o.   MRN: [id number]  HPI   ~  March 25, 2014:  In-patient pulmonary consultation by KC>   REFERRING PHYSICIAN:  Internal Medicine Teaching Service. HISTORY OF PRESENT ILLNESS:  The patient is a very pleasant 71 year old gentleman with very little prior medical care, who I have been asked to see for an abnormal CT chest.  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  He presented to the emergency room with progressive dyspnea on exertion, and a CT angio was done in order to rule out a pulmonary embolus.  This did not show a PET, but did show borderline lymphadenopathy, a small ground-glass opacity in the left lower lobe, pleural thickening with nodularity, right-sided pleural plaques primarily, medially, and finally large low attenuation masses in the pleural space and fissures bilaterally.  The patient has a long history of smoking, and probably has COPD.  He is being treated for a COPD exacerbation.  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  The patient denies any exposure to tuberculosis, but has never had a PPD placed.  He did, however, live in [place] for many years, but never in the [place].  The patient has very little cough and no congestion, but occasionally produces scant purulent mucus.  He has not had excessive weight loss. PAST MEDICAL HISTORY:  Significant for gout as well as arthritis.  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 FAMILY HISTORY:  Significant for diabetes only. SOCIAL HISTORY:  He is married and has worked for [employer], and [employer] as well as [employer].  He  has a history of smoking 1 pack per day for 52 years, but has not smoked since the beginning of the month. REVIEW OF SYSTEMS:  A 10-point review of systems was negative except for that mentioned in history of present illness.  PHYSICAL EXAMINATION:  GENERAL:  He is a well-developed male, in no acute distress. VITAL SIGNS:  Blood pressure 132/86, pulse is 100, respiratory rate 18. He is afebrile.  Oxygen saturation on room air is 92%-94%. HEENT:  Pupils equal, round, reactive to light and accommodation. Extraocular muscles are intact.  Nares are patent without discharge. Oropharynx is clear. NECK:  Without lymphadenopathy or thyromegaly. CHEST:  Very diminished breath sounds throughout, but no true wheezing. There was prominent upper airway pseudo wheezing that resolved with pursed lip breathing. CARDIAC:  Distant heart sounds but regular, 2/6 systolic murmur. ABDOMEN:  Soft, nontender, nondistended.  Good bowel sounds. GENITAL EXAM, RECTAL EXAM< BREAST EXAM:  Not done and not indicated. EXTREMITIES:  Lower extremities with edema noted, no calf tenderness and no cyanosis.  NEUROLOGICAL:  He is alert and oriented and moves all 4 extremities without deficits.  IMPRESSION: 1. Abnormal CT chest with low attenuation masses in the pleural space     bilaterally and especially in the fissures.  It is unclear whether     this is simply loculated fluid, or whether this is an inflammatory     pleural process.  I would find it very unlikely this is a     malignancy,  but certainly cannot exclude.  The patient has had very     little medical care over his life, and therefore no serial chest x-     rays for comparison.  The patient did have a chest x-ray in 2005     that did not showed these abnormalities.  The patient also has a     ground glass opacity in the left lower lobes that may be     inflammatory, but this may be an early malignancy.  He really does     not have significant mediastinal  adenopathy.  It is very difficult     to put all of this together, but the patient does have     granulomatous changes in his liver and spleen, and had lived in     [place] for many years of his life.  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  I would find this to be a very rare presentation     of sarcoidosis, however, nothing can be excluded at this point.  In     terms of planning, I would support following through with a PET     scan, and see where things stand.  At some point he may need     aspiration of one of these low attenuation masses that are abutting the right chest wall.     The pleural process may be nothing to worry about, and just represent loculated fluid.     I suspect none of this is related to his dyspnea on exertion, which     is probably due to COPD. SUGGESTIONS: 1. would place a PPD for completeness => reported NEG 2. Agree with PET scanning => done at [facility]:  CT Chest 04/05/14 showed cardiomegaly, atherosclerotic calcif in Ao & coronaries, +mediastinal adenopathy w/ largest 1.8cm in right lower paratrach region (favored to be reactive), marked centrilob & mild paraseptal emphysema in upper lobes, diffuse bilat GGOs, focal areas of consolidation bilat, mult loculated effusions along the major fissures bilat & along the right minor fissure (pseudotumors), pleural calcif are seen on the right;  Mult punctate calcif in liver & spleen c/w prior granulomatous dis, multilevel DJD in spine...  PET scan 04/05/14 showed intensely hypermetabolic mass involving the right floor of the mouth extending across the midline, mod to intesne FDG avid LNs concerning for mets;  Mild FDG avid mediastinal adenopathy favored to be reactive;;  No FDG avid pulm nodules, loculated pleural effusions, Abd & bones were neg...    ~  April 12, 2014:  Follow up OV by KC>    The patient comes in today after his recent  hospitalization for a COPD exacerbation.  He was discharged on Spiriva alone with Albuterol for rescue, and has done extremely well since that time. He feels that his breathing is adequate, and he rarely requires his rescue inhaler. He denies any significant cough or mucus production. The patient also has a history of a cardiomyopathy, with chronic congestive heart failure and lower extremity edema. He also has a history of an abnormal CT chest with loculated effusions and pleural calcifications. A recent PET scan that [facility] showed no abnormal uptake within his chest. He is getting ready to start chemotherapy for head and neck cancer next week in [facility]. IMP>>  1) Dyspnea:  The patient has significant dyspnea on exertion that I  suspect is related to COPD.  However, he has not had pulmonary function studies, and we will need these for evaluation. He is currently on Spiriva alone, and feels that he is doing extremely well. I will therefore continue him on this regimen. At some point will refer him to pulmonary rehabilitation, but he is getting ready to start chemotherapy for his head and neck cancer next week. 2) Abn CT Chest:  The patient has multiple abnormal findings on CT chest, but his recent PET scan shows no abnormal uptake within the chest. It is unclear whether his abnormal findings are related to asbestos exposure or possibly old granulomatous disease. The patient also has chronic congestive heart failure, and perhaps these are chronically loculated effusions. ADDENDUM>>  Outpt Full PFTs done 05/04/14:  FVC=3.70 (92%), FEV1=2.72 (89%), %1sec=73, mid-flows reduced at 72% predicted; Post bronchodil FEV1 did NOT improve;  TLC=5.94 (82%);  DLCO=39% & DL/VA=49%... He was asked to ret in 3-4 months but he did not do so-- all medical follow up was from [facility]  ENT, Oncology, RAD-ONC, and Cardiology...    ~  July 01, 2016:  43moROV w/ SN>  Julian Zimmerman self-referred because "I want to  stop the Spiriva" - this is his whole agenda for the visit, he is here w/ his wife who helps w/ the history >   I have spent several hours pouring over his Epic record from 2015 and [facility] the [facility]>>    Squamous cell Ca floor of the mouth>  Initial eval in [place] by DBarbaraann Faster [facility](not felt to be a surg candidate due to severe CHF/ cardiomyopathy) w/ ChemoRx including carbo/taxol/cetux starting in 04/2014, then cetux+XRT from 9/29 - 08/27/14 for total 70Gy;  He is followed regularly (Q667moby ENT-DrWeissler & XRT-DrChera last seen 04/23/16 & no evid of recurrent or metastatic dis found on f/u CT scan;  Thyroid function tests checked periodically & remain wnl as well;  Last seen by DrWeissler 01/13/16-- 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     Chemotherapy induced peripheral neuropathy>  [facility] feet reported to be improved w/ Cymbalta60/d and Neurontin300Tid (prev on 900Bid but he weaned it on his own) => they signed off 7 indicated that his PCP was taking over this issue...     COPD- mixed chr bronchitis & emphysema (seen on prev CT Chest)>      Cigarette smoker>  He quit transiently during his Chemoradiation but then restarted & currently smoking betw 1/2-1ppd;  He started smoking in teens, has smoked >50 yrs up to 2ppd w/ an est ~80 pack-yr smoking hx overall;  He went through a [facility] smoking cessation program but was unable to quit;  They continue CT Chest Q6m98mor follow up evals...    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 as seen on prev CT scans    HBP, CAD> (atherosclerotic changes seen in ao & coronaries on CT Chest), Cath 03/2014 by DrVaranasi showed 50% midLAD stenosis, small Circ w/ 60% stenosis in large ramus branch, mild dis in dominant RCA=> placed on aggressive medical therapy & [facility] on ASA81, MetoprololER50, Lasix20-just taken prn edema; NOTE: he is very sens to Lisinopril w/ hypotension x2 in past;     Non-ischemic cardiomyopathy>  w/ EF=20-25% on 2DEcho 03/2014 assoc w/ mildMR, modTR, PAsys=72mHg;  His initial BNP was >23000;  [facility] w/ medical management (per 2DEcho 12/2014)...    Hyperlipidemia> on Atorva40>  ?last FLP 04/06/14 [facility]Chol 164, [facility]     Hx esophageal food impactions requiring EGD & endoscopic food removal by DrOutlaw 1/16 & 3/16 (believed due to eating large food bolus w/o chewing due to his dental issues, no underlying esoph pathology described); dietary adjustments and dental work recommended    Hx alcohol abuse & prot-cal malnutrition>  He has cut back drinking Etoh but still drinks-- serum proteins/ LFTs/ Alb are all wnl now...    Dental problems>  He has dry mouth & uses Biotene...    Anemia>  His last CBC was Jan2016 w/ Hg= 10.3, renal function is wnl    Pt has declined Flu shots> states allergic w/ rash after Flu shotn yrs ago... EXAM shows Afeb, VSS, O2sat=100% on RA at rest; Wt=165#, 6"Tall, BMI=22.4;  HEENT- teeth poor, scarring floor of mouth, indurated neck tissue;  Chest- decr BS bilat, clear w/o w/r/r;  Heart- RR w/o m/r/g;  Abd- soft, nontender, neg;  Ext- VI, no c/c/e;  Neuro- +neuropathy in hands/ feet...   CT Chest w/ contrast 04/[facility]/16 at [facility]>  Normal heart size, scat coronary calcif, unchanged pericard calcif, no pericardial fluid;  Stable emphysema w/ biapical scarring, unchanged pleural calcif, no new pulm nodules, no adenopathy;  Scattered hepatic & splenic granulomas;  DDD, no suspicious osseous lesions...   CXR 07/01/16>  Norm heart size, min blunting of angles bilat, clear lungs w/ biapical pleural thickening, NAD; prev bilat pseudotumors have resolved...   Spirometry 07/01/16>  FVC=3.55 (86%), FEV1=2.00 (64%), %1sec=56%,  mid-flows are reduced at 22% predicted... This represents a 25% drop in his FEV1 over the last 243yrdespite his Spiriva Rx...  Ambulatory Oximetry 07/01/16>  O2sat=99% on RA at rest w/ pulse=73/min;  He ambulated 3 Laps (185' each) w/ lowest O2sat=97% w/ pulse 86/min, no desaturations...  IMP/PLAN>>  Julian Machoeems annoyed that his pulm function has deteriorated over the last several yrs- despite his continued smoking, the interval treatment for his SCCa of the mouth, etc; this has occurred despite the Spiriva daily during that interval; I told him that he desperately needs to quit all smoking AND w/ need to add an ICS/LABA to his regimen- continue Spiriva daily & add BREO- one inhalation daily; he can still use an Albut rescue inhaler as needed; recall that his FullPFTs in 2015 showed a severe reduction in DLCO c/w emphysema... His whole objective in obtaining this pulmonary f/u visit was to be able to STOP his Spiriva rx and we have instead reinforced the need to quit smoking & added the [facility] w/ these recommendations...    ~  Feb 08, 2017:  7-60m40moV and urgent add-on appt requested by pt's wife for hemoptysis>  As above Julian Zimmerman not too pleased that his continued smoking over the last few yrs (despite Dx & Rx for Stage 4 SCCa floor or mouth) lead to worsening COPD & the need for MORE meds (Breo added to his Spiriva/ AlbutHFA);  Not surprisingly he did not take the Breo, continued smoking, and did not follow up w/ us [place] requested; Note: he continued the Spiriva as before;  Pt's wife brings him in today when she heard him coughing & found a tissue w/  blood present=> when pressed for more info & the truth pt indicates that he's had hemoptysis for at least a few weeks- notes cough, mostly dry w/o sputum, occurs day & night, denies SOB or CP, occas notes streaky BRB but he is quite sure there is no sput, denies f/c/s, wife notes some congestion/ rattling/ wheezing but he denies, wife saw blood for  the 1st & only time earlier today, needless to say he has not taken anything extra for this problem...     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  Review of Care Everywhere records reveals:     He saw NP-Lewis 07/22/16 for [facility]> HBP, CAD (50% mid-LAD on cath 03/2014), CHF w/ most recent 2DEcho (12/2014) showing EF=50-55% on Metop & Lasix; intol to Lisinopril w/ low BPs; rec to ret in 182yr.    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 & 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 (pt denied hearing problems; he developed osteoradionecrosis of the mandible & is getting hyperbaric oxygen therapy w/ plans to remove the dead bone thereafter by dental medicine- exam w/ bilat exposed bone worse on the left, no evid recurrent cancer;  Latest CT Chest 10/26/16 showed norm heart size, atherosclerotic changes in Ao & coronaries, unchanged pericardial calcif as well, stable biapical scarring & emphysema, no new pulm nodules, unchqnged right pleural calcif, no  adenopathy, bilat gynecomastia, scattered hepatic & splenic granulomas, degen changes in spine;  Latest labs 10/26/16 w/ TSH=4.11 & FreeT4=0.79...  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     Chemotherapy induced peripheral neuropathy>  [facility] reported to be improved w/ Cymbalta60/d and Neurontin300Tid (prev on 900Bid but he weaned it on his own) => they signed off 7 indicated that his PCP was taking over this issue...Marland KitchenMarland Kitchen  COPD- mixed chr bronchitis & emphysema (seen on prev CT Chest)>      Cigarette smoker>  He quit transiently during his Chemoradiation but then restarted & currently smoking betw 1/2-1ppd;  He started smoking in teens, has smoked >50 yrs up to 2ppd w/ an est ~80 pack-yr smoking hx overall;  He went through a [facility] smoking cessation program but was unable to quit;  They continue CT Chest Q52mofor follow up evals...    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 as seen on prev CT scans    HBP, CAD> (atherosclerotic changes seen in ao & coronaries on CT Chest), Cath 03/2014 by DrVaranasi showed 50% midLAD stenosis, small Circ w/ 60% stenosis in large ramus branch, mild dis in dominant RCA=> placed on aggressive medical therapy [facility] 01[facility]> on ASA81, MetoprololER50, Lasix20-just taken prn edema; NOTE: he is very sens to Lisinopril  w/ hypotension x2 in past;     Non-ischemic cardiomyopathy> w/ EF=20-25% on 2DEcho 03/2014 assoc w/ mildMR, modTR, PAsys=592mg;  His initial BNP was >23000;  [facility]-55% w/ medical management (per 2DEcho 12/2014)...    Hyperlipidemia> on Atorva40>  ?last FLP 04/06/14 [facility]Chol 164, [facility]     Hx esophageal food impactions requiring EGD & endoscopic food removal by DrOutlaw 1/16 & 3/16 (believed due to eating large food bolus w/o chewing due to his dental issues, no underlying esoph pathology described); dietary adjustments and dental work recommended    Hx alcohol abuse & prot-cal malnutrition>  He has cut back drinking Etoh but still drinks-- serum proteins/ LFTs/ Alb are all wnl now...    Dental problems>  He has dry mouth & uses Biotene...    Anemia>  His last CBC was Jan2016 w/ Hg= 10.3, renal function is wnl    Pt has declined Flu shots> states allergic w/ rash after Flu shotn yrs ago... EXAM shows Afeb, VSS, O2sat=100% on RA at rest; Wt=161#, 6"Tall, BMI=22;  HEENT- edentulous & exposed mandib bone bilat, scarring floor of mouth, indurated neck tissue;  Chest- decr BS bilat, clear w/o w/r/r;  Heart- RR w/o m/r/g;  Abd- soft, nontender, neg;  Ext- VI, no c/c/e;  Neuro- +neuropathy in hands/ feet...   CXR 02/08/17 (independently reviewed by me in the PACS system) showed norm heart size, essentially clear lungs w/ sl blunt angles bilat- NAD...  CT Angiogram Chest 02/09/17 showed norm heart size, atherosclerotic changes in Ao & coronaries; NEG for PE; scat mediastinal & hilar LNs- no masses, no change from prior CT 03/2014; biapical pleuroparenchymal scarring & emphysematous changes- no acute pulm process identified (no worrisome lesions or interstitial process); stable dense right sided pleural calcif (?old infection or trauma?); scat calcif granulomas in liver & spleen...  Ambulatory Oximetry 02/08/17 on RA>  O2sat=100% on RA at rest w/  pulse=90/min.  He ambulated 2 laps in office w/ lowest O2sat=99% w/ max pulse=99/min (stopped due to difficulty walking & balance issues)...  LABS 02/08/17>  Chems- wnl x BS=123, Cr=1.06, LFTs=wnl;  CBC- ok w/ Hg=12.9, mcv=83, WBC=4.1;  TSH=5.91 (s/p XRT, not on meds);  Sed=99;  BNP=26;  PT/PTT- wnl... IMP/PLAN>>  We discussed immediate therapy w/ LEVAQUIN500Qd, MEDROL Dosepak, Quit all smoking, Hold ASA, Add the Breo one inhalation daily to the Spiriva daily;  OK to resume Hyperbaric treatments, and we plan ROV recheck in 2 wks...  NOTE:  >50% of this  60 min rov was spent in counseling & coordination of care...    Past Medical History:  Diagnosis Date   COPD/ emphysema >> never started BREO, taking Spiriva daily    Tobacco abuse >> he continues to smoke 1/2 to 1ppd & has an ~80 pack-yr hx   . Arthritis    "right leg" (03/23/2014)   Gout    HBP >> on ToprolXL50, Lasix20    CAD >> nonobstructive CAD on Cath 2015   . Non-ischemic cardiomyopathy >> EF improved from 20-25% in 2015 to 50-55% 12/2014 on Rx   . Hyperlipidemia >> on Lipitor40   . 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 ESOPHAGOGASTRODUODENOSCOPY N/A 12/26/2014   Procedure: ESOPHAGOGASTRODUODENOSCOPY (EGD);  Surgeon: Arta Silence, MD;  Location: [facility];  Service: Endoscopy;  Laterality: N/A;  . FLOOR OF MOUTH BIOPSY  03/2014   "found cancer"  . FOREIGN BODY REMOVAL N/A 10/12/2014   Procedure: FOREIGN BODY REMOVAL;  Surgeon: Jeryl Columbia, MD;  Location: [facility] ENDOSCOPY;  Service: Endoscopy;  Laterality: N/A;  . FOREIGN BODY REMOVAL  N/A 12/26/2014   Procedure: FOREIGN BODY REMOVAL;  Surgeon: Arta Silence, MD;  Location: [facility] ENDOSCOPY;  Service: Endoscopy;  Laterality: N/A;  . FRACTIONAL FLOW RESERVE WIRE  03/27/2014   Procedure: FRACTIONAL FLOW RESERVE WIRE;  Surgeon: Jettie Booze, MD;  Location[facility] C[facility] LAB;  Service: Cardiovascular;;  . LEFT AND RIGHT HEART CATHETERIZATION WITH CORONARY ANGIOGRAM N/A 03/27/2014   Procedure: LEFT AND RIGHT HEART CATHETERIZATION WITH CORONARY ANGIOGRAM;  Surgeon: Jettie Booze, MD;  Location: [facility] CATH LAB;  Service: Cardiovascular;  Laterality: N/A;  . [place]    Outpatient Encounter Prescriptions as of 02/08/2017  Medication Sig  . ammonium lactate (LAC-HYDRIN) 12 % lotion Apply 1 application topically 2 (two) times daily as needed for dry skin.  [name] aspirin EC 81 MG EC tablet Take 1 tablet (81 mg total) by mouth daily.  [name] atorvastatin (LIPITOR) 40 MG tablet Take 1 tablet (40 mg total) by mouth daily at 6 PM. (Patient taking differently: Take 40 mg by mouth daily. )  . bisacodyl (DULCOLAX) 5 MG EC tablet Take 5 mg by mouth.  . carbamide peroxide (EAR DROPS) 6.5 % otic solution Administer 5 drops to the right ear Two (2) times a day.  . furosemide (LASIX) 20 MG tablet Take 20 mg by mouth daily as needed (swelling in legs or feet).  . metoprolol succinate (TOPROL-XL) 50 MG 24 hr tablet Take 50 mg by mouth daily.  . polyethylene glycol (MIRALAX / GLYCOLAX) packet Take 17 g by mouth daily as needed.  . terbinafine (LAMISIL) 250 MG tablet Take 250 mg by mouth daily.  [name] tiotropium (SPIRIVA HANDIHALER) 18 MCG inhalation capsule Place 1 capsule (18 mcg total) into inhaler and inhale daily.  . vitamin B-12 (CYANOCOBALAMIN) 1000 MCG tablet Take 1,000 mcg by mouth daily.  [name] levofloxacin (LEVAQUIN) 500 MG tablet Take 1 tablet (500 mg total) by mouth daily.  . methylPREDNISolone (MEDROL DOSEPAK) 4 MG TBPK tablet  Take as directed  . [DISCONTINUED] DULoxetine  (CYMBALTA) 60 MG capsule Take 1 capsule (60 mg total) by mouth daily.  . [DISCONTINUED] gabapentin (NEURONTIN) 300 MG capsule Take 300 mg by mouth 3 (three) times daily.   . [EXPIRED] methylPREDNISolone acetate (DEPO-MEDROL) injection 80 mg    No facility-administered encounter medications on file as of 02/08/2017.     No Known Allergies   Immunization History  Administered Date(s) Administered  . PPD Test 03/26/2014    Current Medications, Allergies, Past Medical History, Past Surgical History, Family History, and Social History were reviewed in [employer] record.   Review of Systems             All symptoms NEG except where BOLDED >>  Constitutional:  F/C/S, fatigue, anorexia, unexpected weight change. HEENT:  HA, visual changes, hearing loss, earache, nasal symptoms, sore throat, mouth sores, hoarseness. Resp:  cough, sputum, hemoptysis; SOB, tightness, wheezing. Cardio:  CP, palpit, DOE, orthopnea, edema. GI:  N/V/D/C, blood in stool; reflux, abd pain, distention, gas. GU:  dysuria, freq, urgency, hematuria, flank pain, voiding difficulty. MS:  joint pain, swelling, tenderness, decr ROM; neck pain, back pain, etc. Neuro:  HA, tremors, seizures, dizziness, syncope, weakness, numbness, gait abn. Skin:  suspicious lesions or skin rash. Heme:  adenopathy, bruising, bleeding. Psyche:  confusion, agitation, sleep disturbance, hallucinations, anxiety, depression suicidal.   Objective:   Physical Exam       Vital Signs:  Reviewed...   General:  WD,Thin, 71 y/o BM chr ill appearing, in NAD; alert & oriented; pleasant & cooperative, somewhat anxious... HEENT:  [place]/AT; Conjunctiva- pink, Sclera- nonicteric, EOM-wnl, PERRLA, EACs-wax, TMs-obscured; NOSE-clear; THROAT-clear, no lesions seen. Neck:  Supple w/ fair ROM; no JVD; normal carotid impulses w/o bruits; no thyromegaly or nodules palpated; no lymphadenopathy; indurated thickened tissue Chest:  Decr BS bilat  without wheezes, rales, or rhonchi heard. Heart:  Regular Rhythm; norm S1 & S2 without murmurs, rubs, or gallops detected. Abdomen:  Soft & nontender- no guarding or rebound; normal bowel sounds; no organomegaly or masses palpated, sm umbil hernia. Ext:  Abn posture & unable to flex fingers 345 left hand w/ resting tremor; otherw good ROM, mild arthritic changes, +ven insuffic/ dermatitis, no edema... Neuro:  CNs II-XII intact; motor testing normal; +periph neuropathy, balance off... Derm:  No lesions noted; no rash etc. Lymph:  No cervical, supraclavicular, axillary, or inguinal adenopathy palpated.   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    4) COPD- mixed chr bronchitis & emphysema (seen on prev CT Chest)>      5) Cigarette smoker>  He quit transiently during his Chemoradiation but then restarted & currently smoking betw 1/2-1ppd;  He started smoking in teens, has smoked >50 yrs up to 2ppd w/ an est ~80  pack-yr smoking hx overall;  He went through a [facility] smoking cessation program but was unable to quit;  They continue CT Chest Q76mo for follow up evals...    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 as seen on prev CT scans    7) HBP, CAD> (atherosclerotic changes seen in ao & coronaries on CT Chest), Cath 03/2014 by DrVaranasi showed 50% midLAD stenosis, small Circ w/ 60% stenosis in large ramus branch, mild dis in dominant RCA=> placed on aggressive medical therapy & followed by Cards at [facility]-- last seen 01/20/16> on ASA81, MetoprololER50, Lasix20-just taken prn edema; NOTE: he is very sens to Lisinopril w/ hypotension x2 in past;     8) Non-ischemic cardiomyopathy> w/ EF=20-25% on 2DEcho 03/2014 assoc w/ mildMR, modTR, PAsys=2mmHg;  His initial BNP was >23000;  Follow up studies on therapy at [facility] showed improvement in EF to 50-55% w/ medical management (per 2DEcho 12/2014).Marland KitchenMarland Kitchen    9) Hyperlipidemia> on Atorva40>  ?last FLP 04/06/14 showed TChol 164, HDL 61 at [facility]...     10) Hx esophageal food impactions requiring EGD & endoscopic food removal by DrOutlaw 1/16 & 3/16 (believed due to eating large food bolus w/o chewing due to his dental issues, no underlying esoph pathology described); dietary adjustments and dental work recommended    11) Hx alcohol abuse & prot-cal malnutrition>  He has cut back drinking Etoh but still drinks-- serum proteins/ LFTs/ Alb are all wnl now...    12) Dental problems>  He has dry mouth & uses Biotene...    13) HxAnemia>  His last CBC was Jan2016 w/ Hg= 10.3, renal function is wnl, and   PLAN >>  07/01/16>   Julian Zimmerman seems annoyed that his pulm function has deteriorated over the last several yrs- despite his continued smoking, the interval treatment for his SCCa of the mouth, etc; this has occurred despite the Spiriva daily during that interval; I told him that he desperately needs to quit all smoking AND w/ need to add an ICS/LABA to his regimen- continue Spiriva daily & add BREO- one  inhalation daily; he can still use an Albut rescue inhaler as needed; recall that his FullPFTs in 2015 showed a severe reduction in DLCO c/w emphysema... His whole objective in obtaining this pulmonary f/u visit was to be able to STOP his Spiriva rx and we have instead reinforced the need to quit smoking & added the [facility], he was not pleased w/ these recommendations 02/08/17>   We discussed immediate therapy w/ LEVAQUIN500Qd, MEDROL Dosepak, Quit all smoking, Hold ASA, Add the Breo one inhalation daily to the Spiriva daily;  OK to resume Hyperbaric treatments, and we plan ROV recheck in 2 wks..     Plan:     Patient's Medications  New Prescriptions   BREO >>      Rec for pt to start one inhalation daily   LEVOFLOXACIN (LEVAQUIN) 500 MG TABLET    Take 1 tablet (500 mg total) by mouth daily.   METHYLPREDNISOLONE (MEDROL DOSEPAK) 4 MG TBPK TABLET    Take as directed  Previous Medications   AMMONIUM  LACTATE (LAC-HYDRIN) 12 % LOTION    Apply 1 application topically 2 (two) times daily as needed for dry skin.   ASPIRIN EC 81 MG EC TABLET    HOLD   ATORVASTATIN (LIPITOR) 40 MG TABLET    Take 1 tablet (40 mg total) by mouth daily at 6 PM.   BISACODYL (DULCOLAX) 5 MG EC TABLET    Take 5 mg by mouth.   CARBAMIDE PEROXIDE (EAR DROPS) 6.5 % OTIC SOLUTION    Administer 5 drops to the right ear Two (2) times a day.   FUROSEMIDE (LASIX) 20 MG TABLET    Take 20 mg by mouth daily as needed (swelling in legs or feet).   METOPROLOL SUCCINATE (TOPROL-XL) 50 MG 24 HR TABLET    Take 50 mg by mouth daily.   POLYETHYLENE GLYCOL (MIRALAX / GLYCOLAX) PACKET    Take 17 g by mouth daily as needed.   TERBINAFINE (LAMISIL) 250 MG TABLET    Take 250 mg by mouth daily.   TIOTROPIUM (SPIRIVA HANDIHALER) 18 MCG INHALATION CAPSULE    Place 1 capsule (18 mcg total) into inhaler and inhale daily.   VITAMIN B-12 (CYANOCOBALAMIN) 1000 MCG TABLET    Take 1,000 mcg by mouth daily.  Modified Medications   No medications on file   Discontinued Medications   DULOXETINE (CYMBALTA) 60 MG CAPSULE    Take 1 capsule (60 mg total) by mouth daily.   GABAPENTIN (NEURONTIN) 300 MG CAPSULE    Take 300 mg by mouth 3 (three) times daily.

## 2017-02-08 NOTE — Telephone Encounter (Signed)
Per SN--  Add on today at 2 pm with cxr first.  thanks

## 2017-02-08 NOTE — Telephone Encounter (Signed)
Spoke with pt's spouse, who states pt recently had radiation graphing surgery for mouth cancer the first part of March. Neoma Laming states pt is recovering well from surgery. Pt developed a cough 2 weeks ago, cough has worsen x5d. Pt's cough is producing brown mucus with specks of bright red blood x1d. Pt is also experiencing wheezing & increased sob with exertion. Denies any fever, chills or sweats.  Pt's Spouse is requesting an apt with SN, however there is no availability for today.  SN please advise. Thanks.

## 2017-02-08 NOTE — Patient Instructions (Addendum)
Today we updated your med list in our EPIC system...    Continue your current medications the same...  The cause of your hemoptysis is unknown at present... If the blood increases or there is any hemorrhage => call 911 & go immediately to the ER...  We checked a CXR, ambulatory oximetry test, & some blood work today... We will arrange for a CT Angio of your chest ASAP...     We will contact you w/ the results as soon as avail & check on your progress...  For now>    Please STOP the aspirin    Please STOP the smoking    Please START the Breo - one inhalation daily, then gently rinse out your mouth    Please CONTINUE the Frederica - inhale contents of one capsule daily  We are recommending the following treatment>    Start the antibiotic LEVAQUIN 500mg  one tab daily til gone...    We gave you a DEPO-medrol shot today    Starting in the AM 5/8 - START the new MEDROL Dosepak & take it exactly as shown on the pack...  Call for any questions...  Let's plan a follow up visit in 2wks, sooner if needed for problems.Marland KitchenMarland Kitchen

## 2017-02-08 NOTE — Telephone Encounter (Signed)
Called and spoke to pt's spouse. Informed her of the recs per SN. Appt made for today and CXR order has been placed. Pt's verbalized understanding and denied any further questions or concerns at this time.

## 2017-02-09 ENCOUNTER — Ambulatory Visit (INDEPENDENT_AMBULATORY_CARE_PROVIDER_SITE_OTHER)
Admission: RE | Admit: 2017-02-09 | Discharge: 2017-02-09 | Disposition: A | Discharge status: Home / Self Care | Payer: Medicare Other | Source: Ambulatory Visit | Attending: Pulmonary Disease | Admitting: Pulmonary Disease

## 2017-02-09 DIAGNOSIS — R042 Hemoptysis: Secondary | ICD-10-CM

## 2017-02-09 MED ORDER — IOPAMIDOL (ISOVUE-370) INJECTION 76%
80.0000 mL | Freq: Once | INTRAVENOUS | Status: AC | PRN
  Administered 2017-02-09: 80 mL via INTRAVENOUS

## 2017-02-17 DIAGNOSIS — L598 Other specified disorders of the skin and subcutaneous tissue related to radiation: Secondary | ICD-10-CM | POA: Diagnosis not present

## 2017-02-17 DIAGNOSIS — M272 Inflammatory conditions of jaws: Secondary | ICD-10-CM | POA: Diagnosis not present

## 2017-02-18 ENCOUNTER — Ambulatory Visit (INDEPENDENT_AMBULATORY_CARE_PROVIDER_SITE_OTHER): Payer: Medicare Other | Admitting: Pulmonary Disease

## 2017-02-18 ENCOUNTER — Encounter: Payer: Self-pay | Admitting: Pulmonary Disease

## 2017-02-18 VITALS — BP 110/70 | HR 84 | Temp 98.5°F | Ht 72.0 in | Wt 158.2 lb

## 2017-02-18 DIAGNOSIS — M879 Osteonecrosis, unspecified: Secondary | ICD-10-CM

## 2017-02-18 DIAGNOSIS — R269 Unspecified abnormalities of gait and mobility: Secondary | ICD-10-CM

## 2017-02-18 DIAGNOSIS — C049 Malignant neoplasm of floor of mouth, unspecified: Secondary | ICD-10-CM

## 2017-02-18 DIAGNOSIS — J449 Chronic obstructive pulmonary disease, unspecified: Secondary | ICD-10-CM | POA: Diagnosis not present

## 2017-02-18 DIAGNOSIS — F17210 Nicotine dependence, cigarettes, uncomplicated: Secondary | ICD-10-CM

## 2017-02-18 DIAGNOSIS — M8708 Idiopathic aseptic necrosis of bone, other site: Secondary | ICD-10-CM

## 2017-02-18 DIAGNOSIS — G620 Drug-induced polyneuropathy: Secondary | ICD-10-CM | POA: Diagnosis not present

## 2017-02-18 DIAGNOSIS — I2510 Atherosclerotic heart disease of native coronary artery without angina pectoris: Secondary | ICD-10-CM

## 2017-02-18 DIAGNOSIS — I428 Other cardiomyopathies: Secondary | ICD-10-CM | POA: Diagnosis not present

## 2017-02-18 DIAGNOSIS — T451X5A Adverse effect of antineoplastic and immunosuppressive drugs, initial encounter: Secondary | ICD-10-CM

## 2017-02-18 NOTE — Patient Instructions (Signed)
Today we updated your med list in our EPIC system...    Continue your current medications the same...  Continue the BREO one inhalation daily and the SPRIVA (inhale contents of 1 cap daily) regularly...  We discussed getting a copy of your [place] so we can search for the least expensive alternatives... youcan also CALL your Plan and ask them what is the least expensive alternative to [place] under their program...  Then let us know so we can save you some $$  [name]-- you MUST QUIT ALL SMOKING!!! Remember that cigaretttes are ~$5 per pack or about $150 per month!!!  Plus all the known damage they are doing to your system...  Try a nicotine replacement option OTC or call for CHANTIX if you want to go that route...  Call for any questions...  Let's plan a follow up visit in 2-3 months, sooner if needed for breathing problems.Marland KitchenMarland Kitchen

## 2017-02-18 NOTE — Progress Notes (Signed)
Subjective:     Patient ID: Julian Zimmerman, male   DOB: 1945-10-11, 71 y.o.   MRN: [id number]  HPI   ~  March 25, 2014:  In-patient pulmonary consultation by KC>   REFERRING PHYSICIAN:  Internal Medicine Teaching Service. HISTORY OF PRESENT ILLNESS:  The patient is a very pleasant 71 year old gentleman with very little prior medical care, who I have been asked to see for an abnormal CT chest.  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  He presented to the emergency room with progressive dyspnea on exertion, and a CT angio was done in order to rule out a pulmonary embolus.  This did not show a PET, but did show borderline lymphadenopathy, a small ground-glass opacity in the left lower lobe, pleural thickening with nodularity, right-sided pleural plaques primarily, medially, and finally large low attenuation masses in the pleural space and fissures bilaterally.  The patient has a long history of smoking, and probably has COPD.  He is being treated for a COPD exacerbation.  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  The patient denies any exposure to tuberculosis, but has never had a PPD placed.  He did, however, live in [place] for many years, but never in the [place].  The patient has very little cough and no congestion, but occasionally produces scant purulent mucus.  He has not had excessive weight loss. PAST MEDICAL HISTORY:  Significant for gout as well as arthritis.  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 FAMILY HISTORY:  Significant for diabetes only. SOCIAL HISTORY:  He is married and has worked for [employer], and [employer] as well as [employer].  He  has a history of smoking 1 pack per day for 52 years, but has not smoked since the beginning of the month. REVIEW OF SYSTEMS:  A 10-point review of systems was negative except for that mentioned in history of present illness.  PHYSICAL EXAMINATION:  GENERAL:  He is a well-developed male, in no acute distress. VITAL SIGNS:  Blood pressure 132/86, pulse is 100, respiratory rate 18. He is afebrile.  Oxygen saturation on room air is 92%-94%. HEENT:  Pupils equal, round, reactive to light and accommodation. Extraocular muscles are intact.  Nares are patent without discharge. Oropharynx is clear. NECK:  Without lymphadenopathy or thyromegaly. CHEST:  Very diminished breath sounds throughout, but no true wheezing. There was prominent upper airway pseudo wheezing that resolved with pursed lip breathing. CARDIAC:  Distant heart sounds but regular, 2/6 systolic murmur. ABDOMEN:  Soft, nontender, nondistended.  Good bowel sounds. GENITAL EXAM, RECTAL EXAM< BREAST EXAM:  Not done and not indicated. EXTREMITIES:  Lower extremities with edema noted, no calf tenderness and no cyanosis.  NEUROLOGICAL:  He is alert and oriented and moves all 4 extremities without deficits.  IMPRESSION: 1. Abnormal CT chest with low attenuation masses in the pleural space     bilaterally and especially in the fissures.  It is unclear whether     this is simply loculated fluid, or whether this is an inflammatory     pleural process.  I would find it very unlikely this is a     malignancy,  but certainly cannot exclude.  The patient has had very     little medical care over his life, and therefore no serial chest x-     rays for comparison.  The patient did have a chest x-ray in 2005     that did not showed these abnormalities.  The patient also has a     ground glass opacity in the left lower lobes that may be     inflammatory, but this may be an early malignancy.  He really does     not have significant mediastinal  adenopathy.  It is very difficult     to put all of this together, but the patient does have     granulomatous changes in his liver and spleen, and had lived in     [place] for many years of his life.  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  I would find this to be a very rare presentation     of sarcoidosis, however, nothing can be excluded at this point.  In     terms of planning, I would support following through with a PET     scan, and see where things stand.  At some point he may need     aspiration of one of these low attenuation masses that are abutting the right chest wall.     The pleural process may be nothing to worry about, and just represent loculated fluid.     I suspect none of this is related to his dyspnea on exertion, which     is probably due to COPD. SUGGESTIONS: 1. would place a PPD for completeness => reported NEG 2. Agree with PET scanning => done at [facility]:  CT Chest 04/05/14 showed cardiomegaly, atherosclerotic calcif in Ao & coronaries, +mediastinal adenopathy w/ largest 1.8cm in right lower paratrach region (favored to be reactive), marked centrilob & mild paraseptal emphysema in upper lobes, diffuse bilat GGOs, focal areas of consolidation bilat, mult loculated effusions along the major fissures bilat & along the right minor fissure (pseudotumors), pleural calcif are seen on the right;  Mult punctate calcif in liver & spleen c/w prior granulomatous dis, multilevel DJD in spine...  PET scan 04/05/14 showed intensely hypermetabolic mass involving the right floor of the mouth extending across the midline, mod to intesne FDG avid LNs concerning for mets;  Mild FDG avid mediastinal adenopathy favored to be reactive;;  No FDG avid pulm nodules, loculated pleural effusions, Abd & bones were neg...    ~  April 12, 2014:  Follow up OV by KC>    The patient comes in today after his recent  hospitalization for a COPD exacerbation.  He was discharged on Spiriva alone with Albuterol for rescue, and has done extremely well since that time. He feels that his breathing is adequate, and he rarely requires his rescue inhaler. He denies any significant cough or mucus production. The patient also has a history of a cardiomyopathy, with chronic congestive heart failure and lower extremity edema. He also has a history of an abnormal CT chest with loculated effusions and pleural calcifications. A recent PET scan that [facility] showed no abnormal uptake within his chest. He is getting ready to start chemotherapy for head and neck cancer next week in [facility]. IMP>>  1) Dyspnea:  The patient has significant dyspnea on exertion that I  suspect is related to COPD.  However, he has not had pulmonary function studies, and we will need these for evaluation. He is currently on Spiriva alone, and feels that he is doing extremely well. I will therefore continue him on this regimen. At some point will refer him to pulmonary rehabilitation, but he is getting ready to start chemotherapy for his head and neck cancer next week. 2) Abn CT Chest:  The patient has multiple abnormal findings on CT chest, but his recent PET scan shows no abnormal uptake within the chest. It is unclear whether his abnormal findings are related to asbestos exposure or possibly old granulomatous disease. The patient also has chronic congestive heart failure, and perhaps these are chronically loculated effusions. ADDENDUM>>  Outpt Full PFTs done 05/04/14:  FVC=3.70 (92%), FEV1=2.72 (89%), %1sec=73, mid-flows reduced at 72% predicted; Post bronchodil FEV1 did NOT improve;  TLC=5.94 (82%);  DLCO=39% & DL/VA=49%... He was asked to ret in 3-4 months but he did not do so-- all medical follow up was from [facility]  ENT, Oncology, RAD-ONC, and Cardiology...    ~  July 01, 2016:  43moROV w/ SN>  FSamajpresents self-referred because "I want to  stop the Spiriva" - this is his whole agenda for the visit, he is here w/ his wife who helps w/ the history >   I have spent several hours pouring over his Epic record from 2015 and [facility] the [facility]>>    Squamous cell Ca floor of the mouth>  Initial eval in [place] by DBarbaraann Faster [facility](not felt to be a surg candidate due to severe CHF/ cardiomyopathy) w/ ChemoRx including carbo/taxol/cetux starting in 04/2014, then cetux+XRT from 9/29 - 08/27/14 for total 70Gy;  He is followed regularly (Q667moby ENT-DrWeissler & XRT-DrChera last seen 04/23/16 & no evid of recurrent or metastatic dis found on f/u CT scan;  Thyroid function tests checked periodically & remain wnl as well;  Last seen by DrWeissler 01/13/16-- 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     Chemotherapy induced peripheral neuropathy>  [facility] in [facility] and feet reported to be improved w/ Cymbalta60/d and Neurontin300Tid (prev on 900Bid but he weaned it on his own) => they signed off 7 indicated that his PCP was taking over this issue...     COPD- mixed chr bronchitis & emphysema (seen on prev CT Chest)>      Cigarette smoker>  He quit transiently during his Chemoradiation but then restarted & currently smoking betw 1/2-1ppd;  He started smoking in teens, has smoked >50 yrs up to 2ppd w/ an est ~80 pack-yr smoking hx overall;  He [facility] a [facility] smoking cessation program but was unable to quit;  They continue CT Chest Q6m98mor follow up evals...    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 as seen on prev CT scans    HBP, CAD> (atherosclerotic changes seen in ao & coronaries on CT Chest), Cath 03/2014 by DrVaranasi showed 50% midLAD stenosis, small Circ w/ 60% stenosis in large ramus branch, mild dis in dominant RCA=> placed on aggressive medical therapy & [facility]  [facility] on ASA81, MetoprololER50, Lasix20-just taken prn edema; NOTE: he is very sens to Lisinopril w/ hypotension x2 in past;     Non-ischemic cardiomyopathy>  w/ EF=20-25% on 2DEcho 03/2014 assoc w/ mildMR, modTR, PAsys=72mHg;  His initial BNP was >23000;  [facility]55[facility] w/ medical management (per 2DEcho 12/2014)...    Hyperlipidemia> on Atorva40>  ?last FLP 04/06/14 [facility] TChol 164, [facility]     Hx esophageal food impactions requiring EGD & endoscopic food removal by DrOutlaw 1/16 & 3/16 (believed due to eating large food bolus w/o chewing due to his dental issues, no underlying esoph pathology described); dietary adjustments and dental work recommended    Hx alcohol abuse & prot-cal malnutrition>  He has cut back drinking Etoh but still drinks-- serum proteins/ LFTs/ Alb are all wnl now...    Dental problems>  He has dry mouth & uses Biotene...    Anemia>  His last CBC was Jan2016 w/ Hg= 10.3, renal function is wnl    Pt has declined Flu shots> states allergic w/ rash after Flu shotn yrs ago... EXAM shows Afeb, VSS, O2sat=100% on RA at rest; Wt=165#, 6"Tall, BMI=22.4;  HEENT- teeth poor, scarring floor of mouth, indurated neck tissue;  Chest- decr BS bilat, clear w/o w/r/r;  Heart- RR w/o m/r/g;  Abd- soft, nontender, neg;  Ext- VI, no c/c/e;  Neuro- +neuropathy in hands/ feet...   CT Chest [facility]/ [facility]16 [facility]>  Normal heart size, scat coronary calcif, unchanged pericard calcif, no pericardial fluid;  Stable emphysema w/ biapical scarring, unchanged pleural calcif, no new pulm nodules, no adenopathy;  Scattered hepatic & splenic granulomas;  DDD, no suspicious osseous lesions...   CXR 07/01/16>  Norm heart size, min blunting of angles bilat, clear lungs w/ biapical pleural thickening, NAD; prev bilat pseudotumors have resolved...   Spirometry 07/01/16>  FVC=3.55 (86%), FEV1=2.00 (64%), %1sec=56%,  mid-flows are reduced at 22% predicted... This represents a 25% drop in his FEV1 over the last 243yrdespite his Spiriva Rx...  Ambulatory Oximetry 07/01/16>  O2sat=99% on RA at rest w/ pulse=73/min;  He ambulated 3 Laps (185' each) w/ lowest O2sat=97% w/ pulse 86/min, no desaturations...  IMP/PLAN>>  Julian Zimmerman annoyed that his pulm function has deteriorated over the last several yrs- despite his continued smoking, the interval treatment for his SCCa of the mouth, etc; this has occurred despite the Spiriva daily during that interval; I told him that he desperately needs to quit all smoking AND w/ need to add an ICS/LABA to his regimen- continue Spiriva daily & add BREO- one inhalation daily; he can still use an Albut rescue inhaler as needed; recall that his FullPFTs in 2015 showed a severe reduction in DLCO c/w emphysema... His whole objective in obtaining this pulmonary f/u visit was to be able to STOP his Spiriva rx and we have instead reinforced the need to quit smoking & added the [facility] w/ these recommendations...    ~  Feb 08, 2017:  7-60m40moV and urgent add-on appt requested by pt's wife for hemoptysis>  As above FreJosph Machos not too pleased that his continued smoking over the last few yrs (despite Dx & Rx for Stage 4 SCCa floor or mouth) lead to worsening COPD & the need for MORE meds (Breo added to his Spiriva/ AlbutHFA);  Not surprisingly he did not take the Breo, continued smoking, and did not follow up w/ us [place] requested; Note: he continued the Spiriva as before;  Pt's wife brings him in today when she heard him coughing & found a tissue w/  blood present=> when pressed for more info & the truth pt indicates that he's had hemoptysis for at least a few weeks- notes cough, mostly dry w/o sputum, occurs day & night, denies SOB or CP, occas notes streaky BRB but he is quite sure there is no sput, denies f/c/s, wife notes some congestion/ rattling/ wheezing but he denies, wife saw blood for  the 1st & only time earlier today, needless to say he has not taken anything extra for this problem...     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  Review of Care Everywhere records reveals:     He saw NP-Lewis [facility]/22/16 for [facility]> HBP, CAD (50% mid-LAD on cath 03/2014), CHF w/ most recent 2DEcho (12/2014) showing EF=50-55% on Metop & Lasix; intol to Lisinopril w/ low BPs; rec to ret in 149yr.    He saw DrPatel- [facility] last 09/01/16>  Severe periph neuropathy likely ChemoRx related, hx Etoh quit 2015, on Gabapentin/ Cymbalta- note reviewed & they adjusted his meds, plan rov recheck 14m38mo     He was seen by the NP-Knowles 10/26/16 for Radiation Oncology f/u visit> he completed RT 08/2014, getting treatment & HBO from Dental med, they plan rov 68yr33yr   He saw DrKeagy, Hyperbaric wound clinic 11/05/16>  [facility]    He saw DrWeissler, ENT 01/04/17 for f/u Ca floor of mouth & hearing eval> 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 & 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 (pt denied hearing problems; he developed osteoradionecrosis of the mandible & is getting hyperbaric oxygen therapy w/ plans to remove the dead bone thereafter by dental medicine- exam w/ bilat exposed bone worse on the left, no evid recurrent cancer;  Latest CT Chest 10/26/16 showed norm heart size, atherosclerotic changes in Ao & coronaries, unchanged pericardial calcif as well, stable biapical scarring & emphysema, no new pulm nodules, unchqnged right pleural calcif, no  adenopathy, bilat gynecomastia, scattered hepatic & splenic granulomas, degen changes in spine;  Latest labs 10/26/16 w/ TSH=4.11 & FreeT4=0.79...  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     Chemotherapy induced peripheral neuropathy>  [facility] reported to be improved w/ Cymbalta60/d and Neurontin300Tid (prev on 900Bid but he weaned it on his own) => they signed off 7 indicated that his PCP was taking over this issue...Marland KitchenMarland Kitchen  COPD- mixed chr bronchitis & emphysema (seen on prev CT Chest)>      Cigarette smoker>  He quit transiently during his Chemoradiation but then restarted & currently smoking betw 1/2-1ppd;  He started smoking in teens, has smoked >50 yrs up to 2ppd w/ an est ~80 pack-yr smoking hx overall;  He went through a [facility] smoking cessation program but was unable to quit;  They continue CT Chest Q67mofor follow up evals...    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 as seen on prev CT scans    HBP, CAD> (atherosclerotic changes seen in ao & coronaries on CT Chest), Cath 03/2014 by DrVaranasi showed 50% midLAD stenosis, small Circ w/ 60% stenosis in large ramus branch, mild dis in dominant RCA=> placed on aggressive medical therapy & [facility] last seen 01/20/16> on ASA81, MetoprololER50, Lasix20-just taken prn edema; NOTE: he is very sens to Lisinopril  w/ hypotension x2 in past;     Non-ischemic cardiomyopathy> w/ EF=20-25% on 2DEcho 03/2014 assoc w/ mildMR, modTR, PAsys=554mg;  His initial BNP was >23000;  [facility]-55% w/ medical management (per 2DEcho 12/2014)...    Hyperlipidemia> on Atorva40>  ?last FLP 04/06/14 [facility]Chol 164, [facility].     Hx esophageal food impactions requiring EGD & endoscopic food removal by DrOutlaw 1/16 & 3/16 (believed due to eating large food bolus w/o chewing due to his dental issues, no underlying esoph pathology described); dietary adjustments and dental work recommended    Hx alcohol abuse & prot-cal malnutrition>  He has cut back drinking Etoh but still drinks-- serum proteins/ LFTs/ Alb are all wnl now...    Dental problems>  He has dry mouth & uses Biotene...    HxAnemia>  His last CBC was Jan2016 w/ Hg= 10.3, renal function is wnl    Pt has declined Flu shots> states allergic w/ rash after Flu shotn yrs ago... EXAM shows Afeb, VSS, O2sat=100% on RA at rest; Wt=161#, 6"Tall, BMI=22;  HEENT- edentulous & exposed mandib bone bilat, scarring floor of mouth, indurated neck tissue;  Chest- decr BS bilat, clear w/o w/r/r;  Heart- RR w/o m/r/g;  Abd- soft, nontender, neg;  Ext- VI, no c/c/e;  Neuro- +neuropathy in hands/ feet...   CXR 02/08/17 (independently reviewed by me in the PACS system) showed norm heart size, essentially clear lungs w/ sl blunt angles bilat- NAD...  CT Angiogram Chest 02/09/17 showed norm heart size, atherosclerotic changes in Ao & coronaries; NEG for PE; scat mediastinal & hilar LNs- no masses, no change from prior CT 03/2014; biapical pleuroparenchymal scarring & emphysematous changes- no acute pulm process identified (no worrisome lesions or interstitial process); stable dense right sided pleural calcif (?old infection or trauma?); scat calcif granulomas in liver & spleen...  Ambulatory Oximetry 02/08/17 on RA>  O2sat=100% on RA at rest  w/ pulse=90/min.  He ambulated 2 laps in office w/ lowest O2sat=99% w/ max pulse=99/min (stopped due to difficulty walking & balance issues)...  LABS 02/08/17>  Chems- wnl x BS=123, Cr=1.06, LFTs=wnl;  CBC- ok w/ Hg=12.9, mcv=83, WBC=4.1;  TSH=5.91 (s/p XRT, not on meds);  Sed=99;  BNP=26;  PT/PTT- wnl... IMP/PLAN>>  We discussed immediate therapy w/ LEVAQUIN500Qd, MEDROL Dosepak, Quit all smoking, Hold ASA, Add the Breo one inhalation daily to the Spiriva daily;  OK to resume Hyperbaric treatments, and we plan ROV recheck in 2 wks...  NOTE:  >50% of this  60 min rov was spent in counseling & coordination of care...   ~  Feb 17, 2017:  2wk ROV & pulmonary recheck> Julian Zimmerman has completed his Levaquin & Medrol dosepak and reports a good response w/ resolution of the hemoptysis & appetite improving; he notes persistent cough, small amt congestion but no sput or hemoptysis now; similarly he denies SOB/ CP/ f/c/s/ etc;  Overall his breathing is better he says however he continues to smoke ~1ppd despite all efforts to get him to quit;  Wife notes that Breo costs $126 per month & Spiriva is $90/mo now that they have met all deductible expenses- we discussed getting a copy of their Prescription Drug Formulary to aid [place] in selecting the least expensive/ most effective medication available, and I reminded them that 1ppd at $5 per pack is ~$150/month in cigarettes!      I again reviewed the Care Everywhere notes from [place] pt's wife indicates that he had some dental surg (?debridement ?bone graft?) in April & now the hyperbaric oxygen treatments are in anticipation of his beeing set up w/ dentures but there are NO [place] NOTES IN EPIC>>  His PCP is Eloise Levels, NP at [facility] but they are not sure of his last visit there... 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     Chemotherapy induced peripheral neuropathy>  [facility] symptoms in hands and feet reported to be improved w/ Cymbalta and Neurontin...    Osteonecrosis of mandible>  Eval by ENT- DrWeissler, XRT- DrChera, & [facility], ?bone grafting, & referred for Hyperbaric oxygen=> currently in middle of 50 treatment program...    COPD- mixed chr bronchitis & emphysema (seen on prev CT Chest)>  Spirometry 06/2016 w/ mod airflow obstruction & deterioration in his FEV1 over the prev 251yr placed on BREO & Spiriva, advised to quit all smoking!    Cigarette smoker>  He quit transiently during his Chemoradiation but then restarted & currently smoking ~1ppd;  He started smoking in teens, has smoked >50 yrs up to 2ppd w/ an est ~80+ pack-yr smoking hx overall;  He went through a [facility] smoking cessation program but was unable to quit;  They continue CT Chest Q6m32mor follow up evals...    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 as seen on prev CT scans    HBP, CAD> (atherosclerotic changes seen in ao & coronaries on CT Chest), Cath 03/2014 by DrVaranasi showed 50% midLAD stenosis, small Circ w/ 60% stenosis in large ramus branch, mild dis in dominant RCA=> placed on aggressive medical therapy & [facility] on ASA81, MetoprololER50, Lasix20-just taken prn edema; NOTE: he is very sens to Lisinopril w/ hypotension x2 in past;     Non-ischemic cardiomyopathy> w/ EF=20-25% on 2DEcho 03/2014 assoc w/ mildMR, modTR, PAsys=36m13m  His initial BNP was  >23000;  Follow up studies on  therapy at [facility] showed improvement in EF to 50-55% w/ medical management (per 2DEcho 12/2014)...    Hyperlipidemia> on Atorva40>  ?last FLP 04/06/14 showed TChol 164, HDL 61 at [facility]...     Hx esophageal food impactions requiring EGD & endoscopic food removal by DrOutlaw 1/16 & 3/16 (believed due to eating large food bolus w/o chewing due to his dental issues, no underlying esoph pathology described); dietary adjustments and dental work recommended    Hx alcohol abuse & prot-cal malnutrition>  He has cut back drinking Etoh but still drinks-- serum proteins/ LFTs/ Alb are all wnl now...    Dental problems>  He has dry mouth & uses Biotene...    HxAnemia>  His last CBC was Jan2016 w/ Hg= 10.3, renal function is wnl    Pt has declined Flu shots/ vaccinations> states allergic w/ rash after Flu shot yrs ago; reminded to consult w/ his PCP about indicated vaccinations...Marland KitchenMarland KitchenMarland Kitchen EXAM shows Afeb, VSS, O2sat=99% on RA at rest; Wt=158#, 6"Tall, BMI=22;  HEENT- edentulous & exposed mandib bone bilat, scarring floor of mouth, indurated neck tissue;  Chest- decr BS bilat, clear w/o w/r/r;  Heart- RR w/o m/r/g;  Abd- soft, nontender, neg;  Ext- VI, no c/c/e;  Neuro- +neuropathy in hands/ feet...  IMP/PLAN>>  Julian Zimmerman's congestion & hemoptysis have resolved w/ Levaquin & Medrol- now states breathing is at his baseline on Breo & Spiriva daily;  He MUST quit smoking (still at 1ppd) esp to save $$ and afford his meds;  They will get copy of his formulary & we can adjust once we see his coverage; we plan routine recheck in 32mo..    Past Medical History:  Diagnosis Date   COPD/ emphysema >> on BREO & Spiriva daily    Tobacco abuse >> he continues to smoke 1ppd & has an ~80+ pack-yr hx   . Arthritis    "right leg" (03/23/2014)   Gout    HBP >> on ToprolXL50, Lasix20    CAD >> nonobstructive CAD on Cath 2015   . Non-ischemic cardiomyopathy >> EF improved from 20-25% in 2015 to 50-55% 12/2014 on Rx   .  Hyperlipidemia >> on Lipitor40   . 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 ESOPHAGOGASTRODUODENOSCOPY N/A 12/26/2014   Procedure: ESOPHAGOGASTRODUODENOSCOPY (EGD);  Surgeon: WArta Silence MD;  [facility];  Service: Endoscopy;  Laterality: N/A;  . FLOOR OF MOUTH BIOPSY  03/2014   "found cancer"  . FOREIGN BODY REMOVAL N/A 10/12/2014   Procedure: FOREIGN BODY REMOVAL;  Surgeon: MJeryl Columbia MD;  Location: [facility]OSCOPY;  Service: Endoscopy;  Laterality: N/A;  . FOREIGN BODY REMOVAL N/A 12/26/2014   Procedure: FOREIGN BODY REMOVAL;  Surgeon: WArta Silence MD;  Location: [facility] ENDOSCOPY;  Service: Endoscopy;  Laterality: N/A;  . FRACTIONAL FLOW RESERVE WIRE  03/27/2014   Procedure: FRACTIONAL FLOW RESERVE WIRE;  Surgeon: JJettie Booze MD;  [facility];  Service: Cardiovascular;;  . LEFT AND RIGHT HEART CATHETERIZATION WITH CORONARY ANGIOGRAM N/A 03/27/2014   Procedure: LEFT AND RIGHT HEART  CATHETERIZATION WITH CORONARY ANGIOGRAM;  Surgeon: Jettie Booze, MD;  Location: [facility] CATH LAB;  Service: Cardiovascular;  Laterality: N/A;  . [place]    Outpatient Encounter Prescriptions as of 02/18/2017  Medication Sig  . aspirin EC 81 MG EC tablet Take 1 tablet (81 mg total) by mouth daily.  [name] atorvastatin (LIPITOR) 40 MG tablet Take 1 tablet (40 mg total) by mouth daily at 6 PM. (Patient taking differently: Take 40 mg by mouth daily. )  . bisacodyl (DULCOLAX) 5 MG EC tablet Take 5 mg by mouth.  . fluticasone furoate-vilanterol (BREO ELLIPTA) 100-25  MCG/INH AEPB Inhale 1 puff into the lungs daily.  . furosemide (LASIX) 20 MG tablet Take 20 mg by mouth daily as needed (swelling in legs or feet).  . metoprolol succinate (TOPROL-XL) 50 MG 24 hr tablet Take 50 mg by mouth daily.  [name] tiotropium (SPIRIVA HANDIHALER) 18 MCG inhalation capsule Place 1 capsule (18 mcg total) into inhaler and inhale daily.  . vitamin B-12 (CYANOCOBALAMIN) 1000 MCG tablet Take 1,000 mcg by mouth daily.  [name] ammonium lactate (LAC-HYDRIN) 12 % lotion Apply 1 application topically 2 (two) times daily as needed for dry skin.  . carbamide peroxide (EAR DROPS) 6.5 % otic solution Administer 5 drops to the right ear Two (2) times a day.  . levofloxacin (LEVAQUIN) 500 MG tablet Take 1 tablet (500 mg total) by mouth daily. (Patient not taking: Reported on 02/18/2017)  . methylPREDNISolone (MEDROL DOSEPAK) 4 MG TBPK tablet Take as directed (Patient not taking: Reported on 02/18/2017)  . polyethylene glycol (MIRALAX / GLYCOLAX) packet Take 17 g by mouth daily as needed.  . terbinafine (LAMISIL) 250 MG tablet Take 250 mg by mouth daily.   No facility-administered encounter medications on file as of 02/18/2017.     No Known Allergies   Immunization History  Administered Date(s) Administered  . PPD Test 03/26/2014    Current Medications, Allergies, Past Medical History, Past Surgical History, Family History, and Social History were reviewed in [employer] record.   Review of Systems             All symptoms NEG except where BOLDED >>  Constitutional:  F/C/S, fatigue, anorexia, unexpected weight change. HEENT:  HA, visual changes, hearing loss, earache, nasal symptoms, sore throat, mouth sores, hoarseness. Resp:  cough, sputum, hemoptysis; SOB, tightness, wheezing. Cardio:  CP, palpit, DOE, orthopnea, edema. GI:  N/V/D/C, blood in stool; reflux, abd pain, distention, gas. GU:  dysuria, freq, urgency, hematuria, flank pain, voiding difficulty. MS:   joint pain, swelling, tenderness, decr ROM; neck pain, back pain, etc. Neuro:  HA, tremors, seizures, dizziness, syncope, weakness, numbness, gait abn. Skin:  suspicious lesions or skin rash. Heme:  adenopathy, bruising, bleeding. Psyche:  confusion, agitation, sleep disturbance, hallucinations, anxiety, depression suicidal.   Objective:   Physical Exam       Vital Signs:  Reviewed...   General:  WD,Thin, 71 y/o BM chr ill appearing, in NAD; alert & oriented; pleasant & cooperative, somewhat anxious... HEENT:  [place]/AT; Conjunctiva- pink, Sclera- nonicteric, EOM-wnl, PERRLA, EACs-wax, TMs-obscured; NOSE-clear; THROAT-clear, no lesions seen. Neck:  Supple w/ fair ROM; no JVD; normal carotid impulses w/o bruits; no thyromegaly or nodules palpated; no lymphadenopathy; indurated thickened tissue Chest:  Decr BS bilat without wheezes, rales, or rhonchi heard. Heart:  Regular Rhythm; norm S1 & S2 without murmurs, rubs, or gallops detected. Abdomen:  Soft & nontender- no guarding or rebound; normal bowel sounds; no  organomegaly or masses palpated, sm umbil hernia. Ext:  Abn posture & unable to flex fingers 345 left hand w/ resting tremor; otherw fair ROM, +arthritic changes, +ven insuffic/ dermatitis, no edema... Neuro:  CNs II-XII intact; motor testing normal; +periph neuropathy, balance off... Derm:  No lesions noted; no rash etc. Lymph:  No cervical, supraclavicular, axillary, or inguinal adenopathy palpated.   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    4) COPD- mixed chr bronchitis & emphysema (seen on prev CT Chest)>      5) Cigarette smoker>  He quit transiently during his Chemoradiation but then restarted & currently smoking betw 1/2-1ppd;  He started smoking in teens, has smoked >50 yrs up to 2ppd w/ an est ~80 pack-yr smoking hx overall;  He went through a [facility] smoking cessation program but was unable to quit;  They continue CT Chest Q612moor follow up evals...    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 as seen on prev CT scans    7) HBP, CAD> (atherosclerotic changes seen in ao & coronaries on CT Chest), Cath 03/2014 by DrVaranasi showed 50% midLAD stenosis, small Circ w/ 60% stenosis in large ramus branch, mild dis in dominant RCA=> placed on aggressive medical therapy & [facility] last seen 01/20/16> on ASA81, MetoprololER50, Lasix20-just taken prn edema; NOTE: he is very sens to Lisinopril w/ hypotension x2 in past;     8) Non-ischemic cardiomyopathy> w/ EF=20-25% on 2DEcho 03/2014 assoc w/ mildMR, modTR, PAsys=5288m;  His initial BNP was >23000;  [facility]-55% w/ medical management (per 2DEcho 12/2014)... Marland KitchenMarland Kitchen 9) Hyperlipidemia> on Atorva40>  ?last FL[facility] 04/06/14 [facility]ol 164[facility]     10) Hx esophageal food impactions requiring EGD & endoscopic food removal by DrOutlaw 1/16 & 3/16 (believed due to eating large  food bolus w/o chewing due to his dental issues, no underlying esoph pathology described); dietary adjustments and dental work recommended    11) Hx alcohol abuse & prot-cal malnutrition>  He has cut back drinking Etoh but still drinks-- serum proteins/ LFTs/ Alb are all wnl now...    12) Dental problems>  He has dry mouth & uses Biotene...    13) HxAnemia>  His last CBC was Jan2016 w/ Hg= 10.3, renal function is wnl, and  PLAN >>  07/01/16>   Julian Zimmerman seems annoyed that his pulm function has deteriorated over the last several yrs- despite his continued smoking, the interval treatment for his SCCa of the mouth, etc; this has occurred despite the Spiriva daily during that interval; I told him that he desperately needs to quit all smoking AND w/ need to add an ICS/LABA to his regimen- continue Spiriva daily & add BREO- one inhalation daily; he can still use an Albut rescue inhaler as needed; recall that his FullPFTs in 2015 showed a severe reduction in DLCO c/w emphysema... His whole objective in obtaining this pulmonary f/u visit was to be able to STOP his Spiriva rx and we have instead reinforced the need to quit smoking & added the [facility], he was not pleased w/ these recommendations 02/08/17>   We discussed immediate therapy w/ LEVAQUIN500Qd, MEDROL Dosepak, Quit all smoking, Hold ASA, Add the Breo one inhalation daily to the Spiriva daily;  OK to resume Hyperbaric treatments, and we plan ROV recheck in 2 wks.. 02/17/17>   Julian Zimmerman's congestion & hemoptysis have resolved w/ Levaquin & Medrol- now states breathing is at his baseline on Breo & Spiriva daily;  He MUST quit smoking (still at 1ppd) esp to save $$ and afford his meds;  They will get copy of his formulary & we can adjust once we see his coverage; we plan routine recheck in 58mo     Plan:     Patient's Medications  New Prescriptions   No medications on file  Previous Medications   AMMONIUM LACTATE (LAC-HYDRIN) 12 % LOTION    Apply 1 application  topically 2 (two) times daily as needed for dry skin.   ASPIRIN EC 81 MG EC TABLET    Take 1 tablet (81 mg total) by mouth daily.   ATORVASTATIN (LIPITOR) 40 MG TABLET    Take 1 tablet (40 mg total) by mouth daily at 6 PM.   BISACODYL (DULCOLAX) 5 MG EC TABLET    Take 5 mg by mouth.   CARBAMIDE PEROXIDE (EAR DROPS) 6.5 % OTIC SOLUTION    Administer 5 drops to the right ear Two (2) times a day.   FLUTICASONE FUROATE-VILANTEROL (BREO ELLIPTA) 100-25 MCG/INH AEPB    Inhale 1 puff into the lungs daily.   FUROSEMIDE (LASIX) 20 MG TABLET    Take 20 mg by mouth daily as needed (swelling in legs or feet).     METOPROLOL SUCCINATE (TOPROL-XL) 50 MG 24 HR TABLET    Take 50 mg by mouth daily.   POLYETHYLENE GLYCOL (MIRALAX / GLYCOLAX) PACKET    Take 17 g by mouth daily as needed.   TERBINAFINE (LAMISIL) 250 MG TABLET    Take 250 mg by mouth daily.   TIOTROPIUM (SPIRIVA HANDIHALER) 18 MCG INHALATION CAPSULE    Place 1 capsule (18 mcg total) into inhaler and inhale daily.   VITAMIN B-12 (CYANOCOBALAMIN) 1000 MCG TABLET    Take 1,000 mcg by mouth daily.  Modified Medications   No medications on file  Discontinued Medications   No medications on file

## 2017-02-19 DIAGNOSIS — M272 Inflammatory conditions of jaws: Secondary | ICD-10-CM | POA: Diagnosis not present

## 2017-02-19 DIAGNOSIS — L598 Other specified disorders of the skin and subcutaneous tissue related to radiation: Secondary | ICD-10-CM | POA: Diagnosis not present

## 2017-03-04 DIAGNOSIS — M272 Inflammatory conditions of jaws: Secondary | ICD-10-CM | POA: Diagnosis not present

## 2017-03-05 ENCOUNTER — Encounter (HOSPITAL_BASED_OUTPATIENT_CLINIC_OR_DEPARTMENT_OTHER): Payer: Medicare Other | Attending: Internal Medicine

## 2017-03-05 DIAGNOSIS — M272 Inflammatory conditions of jaws: Secondary | ICD-10-CM | POA: Diagnosis not present

## 2017-03-08 ENCOUNTER — Encounter: Payer: Self-pay | Admitting: Neurology

## 2017-03-08 ENCOUNTER — Ambulatory Visit (INDEPENDENT_AMBULATORY_CARE_PROVIDER_SITE_OTHER): Payer: Medicare Other | Admitting: Neurology

## 2017-03-08 VITALS — BP 120/70 | HR 76 | Ht 72.0 in | Wt 158.2 lb

## 2017-03-08 DIAGNOSIS — R29898 Other symptoms and signs involving the musculoskeletal system: Secondary | ICD-10-CM | POA: Diagnosis not present

## 2017-03-08 DIAGNOSIS — G620 Drug-induced polyneuropathy: Secondary | ICD-10-CM | POA: Diagnosis not present

## 2017-03-08 DIAGNOSIS — I2510 Atherosclerotic heart disease of native coronary artery without angina pectoris: Secondary | ICD-10-CM | POA: Diagnosis not present

## 2017-03-08 DIAGNOSIS — G250 Essential tremor: Secondary | ICD-10-CM

## 2017-03-08 DIAGNOSIS — T451X5A Adverse effect of antineoplastic and immunosuppressive drugs, initial encounter: Secondary | ICD-10-CM

## 2017-03-08 DIAGNOSIS — M272 Inflammatory conditions of jaws: Secondary | ICD-10-CM | POA: Diagnosis not present

## 2017-03-08 NOTE — Patient Instructions (Signed)
NCS/EMG of the arms, left > right  Return to clinic 6 month

## 2017-03-08 NOTE — Progress Notes (Signed)
Follow-up Visit   Date: 03/08/17    Julian Zimmerman MRN: [id number] DOB: Dec 20, 1945   Interim History: Julian Zimmerman is a 71 y.o. 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  The patient was accompanied to the clinic by wife who also provides collateral information.    History of present illness: He was diagnosed with squamous cell carcinoma in 2015 and treated with chemotherapy and radiation.  When he started chemotherapy, he developed burning and tingling pain of the hands and feet.  Over the the years, the discomfort of the hands have improved.  He has not had any improvement with neuropathy affecting the feet and his wife reports that he is very sensitive even to light touch.  He currently takes gabapentin 300mg  TID and Cymbalta 60mg  daily.  He would like to reduce the dose because he does not have the pain as severe as previously.  He has history of alcoholism for 12 years and used to drink fifth of whiskey every 3 days.  He has been sober since 2015.  He has no personal history of diabetes.  No family history of neuropathy.  His wife also has noticed his hands can tremor when he is doing certain tasks, such as reaching for things.  It can be present at rest also.  No family history of tremor.  His tremor does not bother him or interfere with his day-to-day activities.  He denies any slowed movements or stiffness.  He walk unassisted and does endorse some imbalance.  UPDATE 03/08/2017:  He is here for 6 month follow-up visit.  He has notice the his left hand is weakness and he is unable flex his middle and ring finger.  He has difficulty manipulating silverware on the left hand and cannot use buttons or zippers, so now only wears elastic pants and tshirts.  His tremors are stable and  do not bother him.  His wife states that he quit alcohol around March and was able to taper gabapentin without any worsening paresthesias.   Medications:  Current Outpatient Prescriptions on File Prior to Visit  Medication Sig Dispense Refill  . ammonium lactate (LAC-HYDRIN) 12 % lotion Apply 1 application topically 2 (two) times daily as needed for dry skin.    [name] aspirin EC 81 MG EC tablet Take 1 tablet (81 mg total) by mouth daily. 30 tablet 0  . atorvastatin (LIPITOR) 40 MG tablet Take 1 tablet (40 mg total) by mouth daily at 6 PM. (Patient taking differently: Take 40 mg by mouth daily. ) 30 tablet 0  . bisacodyl (DULCOLAX) 5 MG EC tablet Take 5 mg by mouth.    . carbamide peroxide (EAR DROPS) 6.5 % otic solution Administer 5 drops to the right ear Two (2) times a day.    . fluticasone furoate-vilanterol (BREO ELLIPTA) 100-25 MCG/INH AEPB Inhale 1 puff into the lungs daily.    . furosemide (LASIX) 20 MG tablet Take 20 mg by mouth daily as needed (swelling in legs or feet).    . metoprolol succinate (TOPROL-XL) 50 MG 24 hr tablet Take 50 mg by mouth daily.    . polyethylene glycol (MIRALAX / GLYCOLAX) packet Take 17 g by mouth daily as needed.    . terbinafine (LAMISIL) 250 MG tablet Take 250 mg by mouth daily.    [name] tiotropium (  SPIRIVA HANDIHALER) 18 MCG inhalation capsule Place 1 capsule (18 mcg total) into inhaler and inhale daily. 90 capsule 3  . vitamin B-12 (CYANOCOBALAMIN) 1000 MCG tablet Take 1,000 mcg by mouth daily.     No current facility-administered medications on file prior to visit.     Allergies: No Known Allergies  Review of Systems:  CONSTITUTIONAL: No fevers, chills, night sweats, or weight loss.  EYES: No visual changes or eye pain ENT: No hearing changes.  No history of nose bleeds.   RESPIRATORY: No cough, wheezing and shortness of breath.   CARDIOVASCULAR: Negative for chest pain, and palpitations.   GI: Negative for abdominal discomfort, blood in stools or black  stools.  No recent change in bowel habits.   GU:  No history of incontinence.   MUSCLOSKELETAL: +history of joint pain or swelling.  No myalgias.   SKIN: Negative for lesions, rash, and itching.   ENDOCRINE: Negative for cold or heat intolerance, polydipsia or goiter.   PSYCH:  No depression or anxiety symptoms.   NEURO: As Above.   Vital Signs:  BP 120/70   Pulse 76   Ht 6' (1.829 m)   Wt 158 lb 4 oz (71.8 kg)   SpO2 99%   BMI 21.46 kg/m   Neurological Exam: MENTAL STATUS including orientation to time, place, person, recent and remote memory, attention span and concentration, language, and fund of knowledge is normal.  Speech is not dysarthric.  CRANIAL NERVES: Face is symmetric.  MOTOR:  Instrinsic hand atrophy.  No fasciculations.  There is a mild and intermittent thumb tremor at rest. Hand tremor is worse with finger to nose testing at end position.  No pronator drift.  Tone is normal.    Right Upper Extremity:    Left Upper Extremity:    Deltoid  5/5   Deltoid  5/5   Biceps  5/5   Biceps  5/5   Triceps  5/5   Triceps  5/5   Wrist extensors  5/5   Wrist extensors  5/5   Wrist flexors  5/5   Wrist flexors  5/5   Finger extensors  5-/5   Finger extensors  4/5   Finger flexors  5-/5   Finger flexors  4/5   Dorsal interossei  4/5   Dorsal interossei  4/5   Abductor pollicis  5-/5   Abductor pollicis  5-/5   Tone (Ashworth scale)  0  Tone (Ashworth scale)  0   Right Lower Extremity:    Left Lower Extremity:    Hip flexors  5/5   Hip flexors  5/5   Hip extensors  5/5   Hip extensors  5/5   Knee flexors  5/5   Knee flexors  5/5   Knee extensors  5/5   Knee extensors  5/5   Dorsiflexors  5/5   Dorsiflexors  5/5   Plantarflexors  5/5   Plantarflexors  5/5   Toe extensors  5/5   Toe extensors  5/5   Toe flexors  5/5   Toe flexors  5/5   Tone (Ashworth scale)  0  Tone (Ashworth scale)  0   MSRs:    Right                             Left brachioradialis 2+  brachioradialis 2+  biceps 2+  biceps 2+  triceps 2+  triceps 2+  patellar 1+  Patellar 1+  ankle jerk 0  ankle jerk 0  Hoffman no  Hoffman no  plantar response down  plantar response down   SENSORY:  Reduced pin prick, temperature, and vibration distal to ankles bilaterally.  There is sway with Rhomberg testing.  COORDINATION/GAIT:  There is a intention tremor of the hands with finger to nose testing. Intact rapid alternating movements bilaterally.  Able to rise from a chair without using arms.  Gait wide based and stable, he prefers to keep shoulders extended, mildly stooped posture.  Arm swing is reduced bilaterally.   Data: Labs 08/31/2016:  Vitamin B12 >1500, vitamin B1 10, copper 201, ceruloplasmin 46  Lab Results  Component Value Date   ALT 17 02/08/2017   AST 18 02/08/2017   ALKPHOS 76 02/08/2017   BILITOT 0.4 02/08/2017    IMPRESSION/PLAN: 1.  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  He reports quitting alcohol since March 2018 and I encouraged him to abstain from this.  He was able to taper off gabapentin and did not notice any worsening paresthesias, with numbness being the primary symptoms.  Continue Cymbalta 60mg  daily.  Fall precautions were stressed and I encouraged him to start using a cane, especially on uneven surfaces  2.  Bilateral hand weakness, worse.  ?worsening neuropathy vs cervical radiculopathy. NCS/EMG of the hands will be ordered.  He currently denies any neck pain or hand paresthesias.    3.  Essential tremor of the hands.  There is mild and intermittent resting tremor, but signs of bradykinesias or rigidity to suggest parkinsonism, will continue to follow him clinically for any signs of this.    4.  Tobacco use.  Smoking cessation instruction/counseling given:  counseled patient on the dangers of tobacco use, advised patient to stop smoking, and reviewed strategies to maximize success  Return to clinic  in 6 months.  The duration of this appointment visit was 30 minutes of face-to-face time with the patient.  Greater than 50% of this time was spent in counseling, explanation of diagnosis, planning of further management, and coordination of care.   Thank you for allowing me to participate in patient's care.  If I can answer any additional questions, I would be pleased to do so.    Sincerely,    Donika K. Posey Pronto, DO

## 2017-03-09 DIAGNOSIS — M272 Inflammatory conditions of jaws: Secondary | ICD-10-CM | POA: Diagnosis not present

## 2017-03-10 DIAGNOSIS — M272 Inflammatory conditions of jaws: Secondary | ICD-10-CM | POA: Diagnosis not present

## 2017-03-11 ENCOUNTER — Ambulatory Visit (INDEPENDENT_AMBULATORY_CARE_PROVIDER_SITE_OTHER): Payer: Medicare Other | Admitting: Neurology

## 2017-03-11 ENCOUNTER — Telehealth: Payer: Self-pay | Admitting: Neurology

## 2017-03-11 DIAGNOSIS — G5620 Lesion of ulnar nerve, unspecified upper limb: Secondary | ICD-10-CM

## 2017-03-11 DIAGNOSIS — R29898 Other symptoms and signs involving the musculoskeletal system: Secondary | ICD-10-CM

## 2017-03-11 DIAGNOSIS — T451X5A Adverse effect of antineoplastic and immunosuppressive drugs, initial encounter: Principal | ICD-10-CM

## 2017-03-11 DIAGNOSIS — G620 Drug-induced polyneuropathy: Secondary | ICD-10-CM

## 2017-03-11 NOTE — Procedures (Signed)
[facility]  [place], [place]  [place], [place] [address] Tel: [phone] Fax:  [phone] Test Date:  03/11/2017  Patient: Julian Zimmerman DOB: 1946/03/17 Physician: Narda Amber, DO  Sex: Male Height: 6' " Ref Phys: Narda Amber, DO  ID#: [id number] Temp: 40.1C Technician:    Patient Complaints: This is a 71 year-old man with chemotherapy-induced neuropathy and history of alcoholism referred for evaluation of bilateral hand weakness.  NCV & EMG Findings: Extensive electrodiagnostic testing of the right upper extremity and additional studies of the left shows:  1. Bilateral median, radial, and left ulnar sensory responses are absent. The ulnar sensory response preserved is the right ulnar which shows reduced amplitude (2.7 V).   2. Bilateral median motor responses are within normal limits. Bilateral ulnar motor responses show slowed conduction velocity across the elbow (A Elbow-B Elbow, L42, R36 m/s), with normal latency and amplitude. 3. Chronic motor axon loss changes are seen affecting the distal hand muscles including bilateral abductor pollicis brevis, first dorsal interosseous, and abductor digiti minimi muscles. There is no evidence of accompanied active denervation.  Impression: 1. The electrophysiologic findings are most consistent with a severe and chronic sensory polyneuropathy, axon loss in type, affecting the upper extremities. 2. 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 3. There is no definite evidence of a cervical radiculopathy.    ___________________________ Narda Amber, DO    Nerve Conduction Studies Anti Sensory Summary Table   Site NR Peak (ms) Norm Peak (ms) P-T Amp (V) Norm P-T Amp  Left Median Anti Sensory (2nd Digit)  Wrist NR  <3.8  >10  Right Median Anti Sensory (2nd Digit)  40.1C  Wrist NR  <3.8  >10  Left Radial Anti Sensory (Base 1st Digit)  40.1C  Wrist NR  <2.8  >10    Right Radial Anti Sensory (Base 1st Digit)  40.1C  Wrist NR  <2.8  >10  Left Ulnar Anti Sensory (5th Digit)  Wrist NR  <3.2  >5  Right Ulnar Anti Sensory (5th Digit)  40.1C  Wrist    3.1 <3.2 2.7 >5   Motor Summary Table   Site NR Onset (ms) Norm Onset (ms) O-P Amp (mV) Norm O-P Amp Site1 Site2 Delta-0 (ms) Dist (cm) Vel (m/s) Norm Vel (m/s)  Left Median Motor (Abd Poll Brev)  40.1C  Wrist    3.7 <4.0 7.1 >5 Elbow Wrist 5.8 30.0 52 >50  Elbow    9.5  6.8         Right Median Motor (Abd Poll Brev)  40.1C  Wrist    3.4 <4.0 7.6 >5 Elbow Wrist 5.7 30.5 54 >50  Elbow    9.1  7.5         Left Ulnar Motor (Abd Dig Minimi)  40.1C  Wrist    2.7 <3.1 8.5 >7 B Elbow Wrist 4.4 27.0 61 >50  B Elbow    7.1  7.5  A Elbow B Elbow 2.4 10.0 42 >50  A Elbow    9.5  6.6         Right Ulnar Motor (Abd Dig Minimi)  40.1C  Wrist    2.5 <3.1 7.2 >7 B Elbow Wrist 4.5 24.0 53 >50  B Elbow    7.0  6.4  A Elbow B Elbow 2.8 10.0 36 >50  A Elbow    9.8  5.9          EMG  Side Muscle Ins Act Fibs Psw Fasc Number Recrt Dur Dur. Amp Amp. Poly Poly. Comment  Right Ext Indicis Nml Nml Nml Nml Nml Nml Nml Nml Nml Nml Nml Nml N/A  Right PronatorTeres Nml Nml Nml Nml Nml Nml Nml Nml Nml Nml Nml Nml N/A  Right Biceps Nml Nml Nml Nml Nml Nml Nml Nml Nml Nml Nml Nml N/A  Right Triceps Nml Nml Nml Nml Nml Nml Nml Nml Nml Nml Nml Nml N/A  Right Deltoid Nml Nml Nml Nml Nml Nml Nml Nml Nml Nml Nml Nml N/A  Right FlexDigProf 4,5 Nml Nml Nml Nml Nml Nml Nml Nml Nml Nml Nml Nml N/A  Right Abd Poll Brev Nml Nml Nml Nml 1- Rapid Some 1+ Some 1+ Nml Nml N/A  Right 1stDorInt Nml Nml Nml Nml 1- Rapid Few 1+ Few 1+ Nml Nml N/A  Right ABD Dig Min Nml Nml Nml Nml 1- Rapid Some 1+ Some 1+ Nml Nml N/A  Left Triceps Nml Nml Nml Nml Nml Nml Nml Nml Nml Nml Nml Nml N/A  Left Deltoid Nml Nml Nml Nml Nml Nml Nml Nml Nml Nml Nml Nml N/A  Left Ext Indicis Nml Nml Nml Nml Nml Nml Nml Nml Nml Nml Nml Nml N/A  Left Abd Poll Brev Nml  Nml Nml Nml 1- Rapid Some 1+ Some 1+ Nml Nml N/A  Left 1stDorInt Nml Nml Nml Nml 1- Rapid Few 1+ Few 1+ Nml Nml N/A  Left ABD Dig Min Nml Nml Nml Nml 1- Rapid Some 1+ Some 1+ Nml Nml N/A  Left FlexDigProf 4,5 Nml Nml Nml Nml Nml Nml Nml Nml Nml Nml Nml Nml N/A  Left PronatorTeres Nml Nml Nml Nml Nml Nml Nml Nml Nml Nml Nml Nml N/A  Left Biceps Nml Nml Nml Nml Nml Nml Nml Nml Nml Nml Nml Nml N/A      Waveforms:

## 2017-03-11 NOTE — Telephone Encounter (Signed)
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 Strategies to minimize trauma and stretching of the nerve discussed and he was recommended to avoid hyperflexion of the elbow and start using an elbow pad.  Alcohol cessation was stressed as this is most likely contributing to his neuropathy.  [name] K. Posey Pronto, DO

## 2017-03-12 DIAGNOSIS — M272 Inflammatory conditions of jaws: Secondary | ICD-10-CM | POA: Diagnosis not present

## 2017-03-15 DIAGNOSIS — M272 Inflammatory conditions of jaws: Secondary | ICD-10-CM | POA: Diagnosis not present

## 2017-03-16 DIAGNOSIS — M272 Inflammatory conditions of jaws: Secondary | ICD-10-CM | POA: Diagnosis not present

## 2017-03-17 DIAGNOSIS — M272 Inflammatory conditions of jaws: Secondary | ICD-10-CM | POA: Diagnosis not present

## 2017-03-18 DIAGNOSIS — M272 Inflammatory conditions of jaws: Secondary | ICD-10-CM | POA: Diagnosis not present

## 2017-03-19 DIAGNOSIS — M272 Inflammatory conditions of jaws: Secondary | ICD-10-CM | POA: Diagnosis not present

## 2017-03-22 DIAGNOSIS — L598 Other specified disorders of the skin and subcutaneous tissue related to radiation: Secondary | ICD-10-CM | POA: Diagnosis not present

## 2017-03-22 DIAGNOSIS — M272 Inflammatory conditions of jaws: Secondary | ICD-10-CM | POA: Diagnosis not present

## 2017-03-23 DIAGNOSIS — M272 Inflammatory conditions of jaws: Secondary | ICD-10-CM | POA: Diagnosis not present

## 2017-03-24 DIAGNOSIS — M272 Inflammatory conditions of jaws: Secondary | ICD-10-CM | POA: Diagnosis not present

## 2017-03-25 DIAGNOSIS — M272 Inflammatory conditions of jaws: Secondary | ICD-10-CM | POA: Diagnosis not present

## 2017-04-10 ENCOUNTER — Emergency Department (HOSPITAL_COMMUNITY): Payer: Medicare Other

## 2017-04-10 ENCOUNTER — Encounter (HOSPITAL_COMMUNITY): Payer: Self-pay | Admitting: Emergency Medicine

## 2017-04-10 ENCOUNTER — Observation Stay (HOSPITAL_COMMUNITY)
Admission: EM | Admit: 2017-04-10 | Discharge: 2017-04-12 | Disposition: A | Discharge status: Home / Self Care | Payer: Medicare Other | Attending: Internal Medicine | Admitting: Internal Medicine

## 2017-04-10 DIAGNOSIS — Z85810 Personal history of malignant neoplasm of tongue: Secondary | ICD-10-CM | POA: Insufficient documentation

## 2017-04-10 DIAGNOSIS — I110 Hypertensive heart disease with heart failure: Secondary | ICD-10-CM | POA: Insufficient documentation

## 2017-04-10 DIAGNOSIS — W1830XA Fall on same level, unspecified, initial encounter: Secondary | ICD-10-CM | POA: Insufficient documentation

## 2017-04-10 DIAGNOSIS — R55 Syncope and collapse: Secondary | ICD-10-CM | POA: Diagnosis present

## 2017-04-10 DIAGNOSIS — I951 Orthostatic hypotension: Secondary | ICD-10-CM | POA: Diagnosis present

## 2017-04-10 DIAGNOSIS — J441 Chronic obstructive pulmonary disease with (acute) exacerbation: Secondary | ICD-10-CM | POA: Insufficient documentation

## 2017-04-10 DIAGNOSIS — S0990XA Unspecified injury of head, initial encounter: Principal | ICD-10-CM | POA: Insufficient documentation

## 2017-04-10 DIAGNOSIS — E43 Unspecified severe protein-calorie malnutrition: Secondary | ICD-10-CM | POA: Diagnosis present

## 2017-04-10 DIAGNOSIS — Z833 Family history of diabetes mellitus: Secondary | ICD-10-CM | POA: Diagnosis not present

## 2017-04-10 DIAGNOSIS — Z66 Do not resuscitate: Secondary | ICD-10-CM | POA: Diagnosis not present

## 2017-04-10 DIAGNOSIS — F17210 Nicotine dependence, cigarettes, uncomplicated: Secondary | ICD-10-CM | POA: Insufficient documentation

## 2017-04-10 DIAGNOSIS — I5022 Chronic systolic (congestive) heart failure: Secondary | ICD-10-CM | POA: Diagnosis not present

## 2017-04-10 DIAGNOSIS — S0182XA Laceration with foreign body of other part of head, initial encounter: Secondary | ICD-10-CM | POA: Insufficient documentation

## 2017-04-10 DIAGNOSIS — I428 Other cardiomyopathies: Secondary | ICD-10-CM

## 2017-04-10 DIAGNOSIS — Y92019 Unspecified place in single-family (private) house as the place of occurrence of the external cause: Secondary | ICD-10-CM | POA: Insufficient documentation

## 2017-04-10 DIAGNOSIS — D649 Anemia, unspecified: Secondary | ICD-10-CM | POA: Diagnosis not present

## 2017-04-10 DIAGNOSIS — M109 Gout, unspecified: Secondary | ICD-10-CM | POA: Diagnosis not present

## 2017-04-10 DIAGNOSIS — M1991 Primary osteoarthritis, unspecified site: Secondary | ICD-10-CM | POA: Diagnosis not present

## 2017-04-10 DIAGNOSIS — Z79899 Other long term (current) drug therapy: Secondary | ICD-10-CM | POA: Diagnosis not present

## 2017-04-10 DIAGNOSIS — D72829 Elevated white blood cell count, unspecified: Secondary | ICD-10-CM | POA: Insufficient documentation

## 2017-04-10 DIAGNOSIS — E871 Hypo-osmolality and hyponatremia: Secondary | ICD-10-CM | POA: Diagnosis not present

## 2017-04-10 DIAGNOSIS — S0181XA Laceration without foreign body of other part of head, initial encounter: Secondary | ICD-10-CM | POA: Insufficient documentation

## 2017-04-10 DIAGNOSIS — S199XXA Unspecified injury of neck, initial encounter: Secondary | ICD-10-CM | POA: Diagnosis not present

## 2017-04-10 DIAGNOSIS — Z7982 Long term (current) use of aspirin: Secondary | ICD-10-CM | POA: Insufficient documentation

## 2017-04-10 DIAGNOSIS — C049 Malignant neoplasm of floor of mouth, unspecified: Secondary | ICD-10-CM | POA: Diagnosis present

## 2017-04-10 DIAGNOSIS — S2191XA Laceration without foreign body of unspecified part of thorax, initial encounter: Secondary | ICD-10-CM | POA: Diagnosis not present

## 2017-04-10 DIAGNOSIS — J449 Chronic obstructive pulmonary disease, unspecified: Secondary | ICD-10-CM | POA: Diagnosis present

## 2017-04-10 MED ORDER — SODIUM CHLORIDE 0.9 % IV BOLUS (SEPSIS)
500.0000 mL | Freq: Once | INTRAVENOUS | Status: AC
  Administered 2017-04-11: 500 mL via INTRAVENOUS

## 2017-04-10 MED ORDER — TETANUS-DIPHTH-ACELL PERTUSSIS 5-2.5-18.5 LF-MCG/0.5 IM SUSP
0.5000 mL | Freq: Once | INTRAMUSCULAR | Status: AC
  Administered 2017-04-11: 0.5 mL via INTRAMUSCULAR
  Filled 2017-04-10: qty 0.5

## 2017-04-10 NOTE — ED Notes (Signed)
ED Provider at bedside. 

## 2017-04-10 NOTE — ED Notes (Signed)
Patient transported to CT 

## 2017-04-10 NOTE — ED Provider Notes (Signed)
[facility] DEPT Provider Note   CSN: [id number] Arrival date & time: 04/10/17  2232  By signing my name below, I, Julian Zimmerman, attest that this documentation has been prepared under the direction and in the presence of physician practitioner, Eleesha Purkey, Annie Main, MD. Electronically Signed: Dora Zimmerman, Scribe. 04/11/2017. 12:06 AM.  History   Chief Complaint Chief Complaint  Patient presents with  . Fall   The history is provided by the patient and the spouse. No language interpreter was used.    HPI Comments: Julian Zimmerman is a 71 y.o. male with PMHx including CHF, HTN, CAD, and peripheral neuropathy who presents to the Emergency Department via EMS for evaluation s/p fall that occurred shortly prior to arrival. He is unsure of the mechanism of the fall and states "I just lost my balance" without tripping or stumbling and explicitly denies any dizziness or lightheadedness. He struck a wall during the fall and a picture frame fell and caused a laceration to his left forehead. His wife believes that there could be small shards of glass in the wound. Patient believes he lost consciousness briefly during the fall and his wife states that patient was disoriented and "staring off" upon regaining consciousness, but is now back to his baseline mental status. He has no prior h/o syncopal episodes. Patient endorses mild pain secondary to his laceration and states the wound has bled persistently. He takes a baby aspirin daily but does not use any other blood thinners. Patient did not eat dinner tonight but has otherwise been eating and drinking normally. No h/o DM. His tetanus status is unknown but likely needs to be updated. He denies back pain, neck pain, chest pain, abdominal pain, nausea, vomiting, or any other associated symptoms. Patient was previously followed by [facility] and is currently "in between" PCP's.  Past Medical History:  Diagnosis Date  . Arthritis    "right leg"  (03/23/2014)  . Chronic systolic congestive heart failure ([place])   . Gout    "right leg" (03/23/2014)  . HTN (hypertension)   . Oral cancer ([place])   . Tobacco abuse     Patient Active Problem List   Diagnosis Date Noted  . Hemoptysis 02/08/2017  . Peripheral neuropathy due to chemotherapy ([place]) 02/08/2017  . Abnormality of gait and mobility 02/08/2017  . 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 Cigarette smoker 07/01/2016  . Non-ischemic cardiomyopathy ([place]) 03/28/2014  . Impaired fasting glucose 03/26/2014  . Abnormal CT scan of lung 03/26/2014  . Microscopic hematuria 03/26/2014  . Alcohol abuse 03/24/2014  . Protein-calorie malnutrition, severe ([place]) 03/24/2014  . 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 SOB (shortness of breath) on exertion 03/23/2014  . Acute exacerbation of chronic obstructive pulmonary disease (COPD) ([place]) 03/23/2014  . CAD (coronary artery disease) 03/23/2014    Past Surgical History:  Procedure Laterality Date  . ESOPHAGOGASTRODUODENOSCOPY N/A 12/26/2014   Procedure: ESOPHAGOGASTRODUODENOSCOPY (EGD);  Surgeon: Arta Silence, MD;  Location: [facility];  Service: Endoscopy;  Laterality: N/A;  . FLOOR OF MOUTH BIOPSY  03/2014   "found cancer"  . FOREIGN BODY REMOVAL N/A 10/12/2014   Procedure: FOREIGN BODY REMOVAL;  Surgeon: Jeryl Columbia, MD;  Location: [facility] ENDOSCOPY;  Service: Endoscopy;  Laterality: N/A;  . FOREIGN BODY REMOVAL N/A 12/26/2014   Procedure: FOREIGN BODY REMOVAL;  Surgeon: Arta Silence, MD;  Location: [facility] ENDOSCOPY;  Service: Endoscopy;  Laterality: N/A;  . FRACTIONAL FLOW RESERVE WIRE  03/27/2014   Procedure: FRACTIONAL FLOW RESERVE WIRE;  Surgeon: Jettie Booze, MD;  Location: [facility] CATH LAB;  Service: Cardiovascular;;  . LEFT AND RIGHT HEART CATHETERIZATION WITH CORONARY ANGIOGRAM N/A 03/27/2014   Procedure: LEFT AND RIGHT HEART CATHETERIZATION WITH CORONARY ANGIOGRAM;  Surgeon:  Jettie Booze, MD;  Location[facility];  Service: Cardiovascular;  Laterality: N/A;  . [place] Medications    Prior to Admission medications   Medication Sig Start Date End Date Taking? Authorizing Provider  ammonium lactate (LAC-HYDRIN) 12 % lotion Apply 1 application topically 2 (two) times daily as needed for dry skin.    [provider]  aspirin EC 81 MG EC tablet Take 1 tablet (81 mg total) by mouth daily. 03/28/14   Lucious Groves, DO  atorvastatin (LIPITOR) 40 MG tablet Take 1 tablet (40 mg total) by mouth daily at 6 PM. Patient taking differently: Take 40 mg by mouth daily.  03/28/14   Lucious Groves, DO  bisacodyl (DULCOLAX) 5 MG EC tablet Take 5 mg by mouth.    [provider]  carbamide peroxide (EAR DROPS) 6.5 % otic solution Administer 5 drops to the right ear Two (2) times a day. 08/12/16 08/12/17  [provider]  DULoxetine (CYMBALTA) 30 MG capsule Take one pill daily for one week then increase to 2 pills dayl 02/19/17 06/22/17  [provider]  fluticasone furoate-vilanterol (BREO ELLIPTA) 100-25 MCG/INH AEPB Inhale 1 puff into the lungs daily.    [provider]  furosemide (LASIX) 20 MG tablet Take 20 mg by mouth daily as needed (swelling in legs or feet).    [provider]  metoprolol succinate (TOPROL-XL) 50 MG 24 hr tablet Take 50 mg by mouth daily. 12/10/14   [provider]  polyethylene glycol (MIRALAX / GLYCOLAX) packet Take 17 g by mouth daily as needed.    [provider]  terbinafine (LAMISIL) 250 MG tablet Take 250 mg by mouth daily.    [provider]  tiotropium (SPIRIVA HANDIHALER) 18 MCG inhalation capsule Place 1 capsule (18 mcg total) into inhaler and inhale daily. 12/18/16   Noralee Space, MD  vitamin B-12 (CYANOCOBALAMIN) 1000 MCG tablet Take 1,000 mcg by mouth daily.    [provider]    Family History Family History   Problem Relation Age of Onset  . Diabetes Mother   . Diabetes Father     Social History Social History  Substance Use Topics  . Smoking status: Current Every Day Smoker    Packs/day: 1.00    Years: 50.00    Types: Cigarettes  . Smokeless tobacco: Never Used  . Alcohol use 16.8 oz/week    28 Shots of liquor per week     Comment: 03/23/2014 "4, 1oz shots/day"     Allergies   Patient has no known allergies.   Review of Systems Review of Systems  All other systems reviewed and are negative for acute change except as noted in the HPI. Physical Exam Updated Vital Signs BP 112/65   Pulse 98   Temp 98.1 F (36.7 C) (Oral)   Resp 19   SpO2 94%   Physical Exam  Constitutional: He is oriented to person, place, and time. He appears well-developed and well-nourished. No distress.  HENT:  Head: Normocephalic.  Mouth/Throat: Oropharynx is clear and moist. No oropharyngeal exudate.  C-shaped, curvilinear, flap-type laceration with active bleeding to the left  forehead measuring 4x3 cm. Edentulous.  Eyes: Conjunctivae and EOM are normal. Pupils are equal, round, and reactive to light.  Neck: Normal range of motion. Neck supple.  No meningismus.  Cardiovascular: Normal rate, regular rhythm, normal heart sounds and intact distal pulses.   No murmur heard. Pulmonary/Chest: Effort normal and breath sounds normal. No respiratory distress.  Abdominal: Soft. There is no tenderness. There is no rebound and no guarding.  Musculoskeletal: Normal range of motion. He exhibits no edema or tenderness.  No c-spine tenderness. Moving all extremities.  Neurological: He is alert and oriented to person, place, and time. No cranial nerve deficit. He exhibits normal muscle tone. Coordination normal.  No ataxia on finger to nose bilaterally. No pronator drift. 5/5 strength throughout. CN 2-12 intact.Equal grip strength. Sensation intact.   Skin: Skin is warm.  Psychiatric: He has a normal mood and  affect. His behavior is normal.  Nursing note and vitals reviewed.  ED Treatments / Results  Labs (all labs ordered are listed, but only abnormal results are displayed) Labs Reviewed  CBC WITH DIFFERENTIAL/PLATELET - Abnormal; Notable for the following:       Result Value   WBC 14.1 (*)    Hemoglobin 11.3 (*)    HCT 35.5 (*)    Neutro Abs 12.1 (*)    Monocytes Absolute 1.4 (*)    All other components within normal limits  BASIC METABOLIC PANEL - Abnormal; Notable for the following:    Sodium 134 (*)    CO2 20 (*)    Glucose, Bld 122 (*)    Calcium 8.7 (*)    All other components within normal limits  URINALYSIS, ROUTINE W REFLEX MICROSCOPIC - Abnormal; Notable for the following:    Protein, ur 30 (*)    Squamous Epithelial / LPF 0-5 (*)    All other components within normal limits  CBC - Abnormal; Notable for the following:    WBC 11.3 (*)    RBC 3.83 (*)    Hemoglobin 10.1 (*)    HCT 31.8 (*)    All other components within normal limits  BASIC METABOLIC PANEL - Abnormal; Notable for the following:    CO2 19 (*)    Glucose, Bld 123 (*)    Calcium 8.0 (*)    All other components within normal limits  CULTURE, BLOOD (ROUTINE X 2)  CULTURE, BLOOD (ROUTINE X 2)  URINE CULTURE  TROPONIN I  ETHANOL  TROPONIN I  I-STAT CG4 LACTIC ACID, ED    EKG  EKG Interpretation  Date/Time:  Saturday April 10 2017 22:45:15 EDT Ventricular Rate:  100 PR Interval:    QRS Duration: 86 QT Interval:  329 QTC Calculation: 425 R Axis:   40 Text Interpretation:  Sinus tachycardia Abnormal R-wave progression, early transition Borderline repolarization abnormality No significant change was found Confirmed by Ezequiel Essex [phone]) on 04/10/2017 11:37:55 PM       Radiology Ct Head Wo Contrast  Result Date: 04/10/2017 CLINICAL DATA:  Status post fall, hitting left side of head. Concern for head or cervical spine injury. Initial encounter. EXAM: CT HEAD WITHOUT CONTRAST CT CERVICAL SPINE  WITHOUT CONTRAST TECHNIQUE: Multidetector CT imaging of the head and cervical spine was performed following the standard protocol without intravenous contrast. Multiplanar CT image reconstructions of the cervical spine were also generated. COMPARISON:  MRI of the brain from 11/29/2007, and CT of the neck performed 03/14/2014 FINDINGS: CT HEAD FINDINGS Brain: No evidence of acute infarction, hemorrhage, hydrocephalus, extra-axial  collection or mass lesion/mass effect. Prominence of the ventricles and sulci reflects mild cortical volume loss. Mild cerebellar atrophy is noted. Scattered periventricular white matter change likely reflects small vessel ischemic microangiopathy. The brainstem and fourth ventricle are within normal limits. The basal ganglia are unremarkable in appearance. The cerebral hemispheres demonstrate grossly normal gray-white differentiation. No mass effect or midline shift is seen. Vascular: No hyperdense vessel or unexpected calcification. Skull: There is no evidence of fracture; visualized osseous structures are unremarkable in appearance. Sinuses/Orbits: The orbits are within normal limits. There is near complete opacification of the mastoid air cells bilaterally. The paranasal sinuses are well-aerated. Other: No significant soft tissue abnormalities are seen. CT CERVICAL SPINE FINDINGS Alignment: Normal. Skull base and vertebrae: No acute fracture. No primary bone lesion or focal pathologic process. Soft tissues and spinal canal: No prevertebral fluid or swelling. No visible canal hematoma. Disc levels: Mild disc space narrowing is noted along the lower cervical spine, with prominent anterior osteophytes. Mild degenerative change is noted about the dens. Facet disease is noted along the mid cervical spine. Upper chest: Emphysema is noted at the lung apices. The thyroid gland is unremarkable. Calcification is noted at the carotid bifurcations bilaterally. Other: No additional soft tissue  abnormalities are seen. IMPRESSION: 1. No evidence of traumatic intracranial injury or fracture. 2. No evidence of acute fracture or subluxation along the cervical spine. 3. Mild cortical volume loss and scattered small vessel ischemic microangiopathy. 4. Mild degenerative change noted along the cervical spine. 5. Emphysema at the lung apices. 6. Calcification at the carotid bifurcations bilaterally. Carotid ultrasound would be helpful for further evaluation, when and as deemed clinically appropriate. 7. Near complete opacification of the mastoid air cells bilaterally. Electronically Signed   By: Garald Balding M.D.   On: 04/10/2017 23:57   Ct Cervical Spine Wo Contrast  Result Date: 04/10/2017 CLINICAL DATA:  Status post fall, hitting left side of head. Concern for head or cervical spine injury. Initial encounter. EXAM: CT HEAD WITHOUT CONTRAST CT CERVICAL SPINE WITHOUT CONTRAST TECHNIQUE: Multidetector CT imaging of the head and cervical spine was performed following the standard protocol without intravenous contrast. Multiplanar CT image reconstructions of the cervical spine were also generated. COMPARISON:  MRI of the brain from 11/29/2007, and CT of the neck performed 03/14/2014 FINDINGS: CT HEAD FINDINGS Brain: No evidence of acute infarction, hemorrhage, hydrocephalus, extra-axial collection or mass lesion/mass effect. Prominence of the ventricles and sulci reflects mild cortical volume loss. Mild cerebellar atrophy is noted. Scattered periventricular white matter change likely reflects small vessel ischemic microangiopathy. The brainstem and fourth ventricle are within normal limits. The basal ganglia are unremarkable in appearance. The cerebral hemispheres demonstrate grossly normal gray-white differentiation. No mass effect or midline shift is seen. Vascular: No hyperdense vessel or unexpected calcification. Skull: There is no evidence of fracture; visualized osseous structures are unremarkable in  appearance. Sinuses/Orbits: The orbits are within normal limits. There is near complete opacification of the mastoid air cells bilaterally. The paranasal sinuses are well-aerated. Other: No significant soft tissue abnormalities are seen. CT CERVICAL SPINE FINDINGS Alignment: Normal. Skull base and vertebrae: No acute fracture. No primary bone lesion or focal pathologic process. Soft tissues and spinal canal: No prevertebral fluid or swelling. No visible canal hematoma. Disc levels: Mild disc space narrowing is noted along the lower cervical spine, with prominent anterior osteophytes. Mild degenerative change is noted about the dens. Facet disease is noted along the mid cervical spine. Upper chest: Emphysema is noted at  the lung apices. The thyroid gland is unremarkable. Calcification is noted at the carotid bifurcations bilaterally. Other: No additional soft tissue abnormalities are seen. IMPRESSION: 1. No evidence of traumatic intracranial injury or fracture. 2. No evidence of acute fracture or subluxation along the cervical spine. 3. Mild cortical volume loss and scattered small vessel ischemic microangiopathy. 4. Mild degenerative change noted along the cervical spine. 5. Emphysema at the lung apices. 6. Calcification at the carotid bifurcations bilaterally. Carotid ultrasound would be helpful for further evaluation, when and as deemed clinically appropriate. 7. Near complete opacification of the mastoid air cells bilaterally. Electronically Signed   By: Garald Balding M.D.   On: 04/10/2017 23:57    Procedures .Marland KitchenLaceration Repair Date/Time: 04/10/2017 11:14 PM Performed by: Ezequiel Essex Authorized by: Ezequiel Essex   Consent:    Consent obtained:  Verbal   Consent given by:  Patient   Risks discussed:  Poor cosmetic result, poor wound healing and retained foreign body Anesthesia (see MAR for exact dosages):    Anesthesia method:  Local infiltration   Local anesthetic:  Lidocaine 2% WITH  epi Laceration details:    Location:  Face   Face location:  Forehead   Length (cm):  4 Repair type:    Repair type:  Complex Pre-procedure details:    Preparation:  Patient was prepped and draped in usual sterile fashion and imaging obtained to evaluate for foreign bodies Exploration:    Hemostasis achieved with:  Epinephrine, direct pressure and tied off vessels   Wound exploration: wound explored through full range of motion and entire depth of wound probed and visualized     Wound extent: foreign bodies/material and vascular damage     Contaminated: no   Treatment:    Area cleansed with:  Saline and Betadine   Amount of cleaning:  Extensive   Irrigation solution:  Sterile water   Irrigation method:  Syringe   Visualized foreign bodies/material removed: yes   Subcutaneous repair:    Suture size:  3-0   Suture material:  Vicryl   Suture technique:  Figure eight   Number of sutures:  3 Skin repair:    Repair method:  Sutures   Suture size:  5-0   Suture material:  Prolene   Suture technique:  Simple interrupted Approximation:    Approximation:  Close   Vermilion border: well-aligned   Post-procedure details:    Dressing:  Open (no dressing)   Patient tolerance of procedure:  Tolerated well, no immediate complications Comments:     Also used three 3-0 Vicryl sutures to initially seal the wound. .Foreign Body Removal Date/Time: 04/10/2017 11:35 PM Performed by: Ezequiel Essex Authorized by: Ezequiel Essex  Consent: Verbal consent obtained. Consent given by: patient Patient understanding: patient states understanding of the procedure being performed Patient consent: the patient's understanding of the procedure matches consent given Procedure consent: procedure consent matches procedure scheduled Patient identity confirmed: verbally with patient and hospital-assigned identification number Body area: skin General location: head/neck Location details: face Anesthesia:  local infiltration  Anesthesia: Local Anesthetic: lidocaine 2% with epinephrine  Sedation: Patient sedated: no Patient restrained: no Patient cooperative: yes Localization method: visualized Removal mechanism: hemostat and forceps Tendon involvement: none Depth: subcutaneous Complexity: complex 2 objects recovered. Objects recovered: 3 cm piece of glass and a 0.5 cm piece of glass Post-procedure assessment: foreign body removed Patient tolerance: Patient tolerated the procedure well with no immediate complications Comments: Two pieces of glass measuring 3 cm and 0.5 cm removed  from the forehead laceration.   (including critical care time)  DIAGNOSTIC STUDIES: Oxygen Saturation is 94% on RA, adequate by my interpretation.    COORDINATION OF CARE: 11:24 PM Discussed treatment plan with pt and his wife at bedside and they agreed to plan.  Medications Ordered in ED Medications  Tdap (BOOSTRIX) injection 0.5 mL (not administered)  sodium chloride 0.9 % bolus 500 mL (not administered)     Initial Impression / Assessment and Plan / ED Course  I have reviewed the triage vital signs and the nursing notes.  Pertinent labs & imaging results that were available during my care of the patient were reviewed by me and considered in my medical decision making (see chart for details).     Patient with fall from standing and possible syncope with head laceration. Denies any preceding dizziness or lightheadedness. No chest pain or shortness of breath. Hypotensive and diaphoretic on EMS arrival  EKG no acute changes. No Brugada, no prolonged QT.  Patient with arterial forehead laceration with large pieces of glass embedded.  Laceration repaired as above. 2 pieces of glass removed. CT head and C-spine are negative. Tetanus updated.   Orthostatics are positive. Heart rate drops to the 80s with standing. Patient given gentle hydration. Labs are reassuring. Patient does have history of  cardiomyopathy with low ejection fraction. consider possibility of ventricular arrhythmias.  Plan admission for observation given his syncope without prodrome. D/w Dr. Loleta Books.  Final Clinical Impressions(s) / ED Diagnoses   Final diagnoses:  Injury of head, initial encounter  Syncope, unspecified syncope type  Laceration of forehead, initial encounter    New Prescriptions New Prescriptions   No medications on file   I personally performed the services described in this documentation, which was scribed in my presence. The recorded information has been reviewed and is accurate.   Ezequiel Essex, MD 04/11/17 [phone]

## 2017-04-10 NOTE — ED Triage Notes (Signed)
Pt presents via EMS for fall. Pt states he was on his way to the bathroom and lost balance. When he fell a picture frame fell off the wall and hit him on the left side of his forehead causing a laceration per ems. EMS stated patient was diaphoretic on arrival and fire got an initial pressure of 80s/40s. Pt is A+Ox4 currently and in no distress. Denies any symptoms prior to fall but did have + LOC

## 2017-04-11 ENCOUNTER — Encounter (HOSPITAL_COMMUNITY): Payer: Self-pay | Admitting: Family Medicine

## 2017-04-11 ENCOUNTER — Emergency Department (HOSPITAL_COMMUNITY): Payer: Medicare Other

## 2017-04-11 DIAGNOSIS — C049 Malignant neoplasm of floor of mouth, unspecified: Secondary | ICD-10-CM | POA: Diagnosis not present

## 2017-04-11 DIAGNOSIS — J449 Chronic obstructive pulmonary disease, unspecified: Secondary | ICD-10-CM

## 2017-04-11 DIAGNOSIS — S0990XA Unspecified injury of head, initial encounter: Secondary | ICD-10-CM | POA: Diagnosis not present

## 2017-04-11 DIAGNOSIS — S2191XA Laceration without foreign body of unspecified part of thorax, initial encounter: Secondary | ICD-10-CM | POA: Diagnosis not present

## 2017-04-11 DIAGNOSIS — I5022 Chronic systolic (congestive) heart failure: Secondary | ICD-10-CM | POA: Diagnosis present

## 2017-04-11 DIAGNOSIS — D649 Anemia, unspecified: Secondary | ICD-10-CM

## 2017-04-11 DIAGNOSIS — I951 Orthostatic hypotension: Secondary | ICD-10-CM | POA: Diagnosis not present

## 2017-04-11 DIAGNOSIS — E871 Hypo-osmolality and hyponatremia: Secondary | ICD-10-CM | POA: Diagnosis not present

## 2017-04-11 DIAGNOSIS — I428 Other cardiomyopathies: Secondary | ICD-10-CM | POA: Diagnosis not present

## 2017-04-11 DIAGNOSIS — R55 Syncope and collapse: Secondary | ICD-10-CM

## 2017-04-11 DIAGNOSIS — E43 Unspecified severe protein-calorie malnutrition: Secondary | ICD-10-CM | POA: Diagnosis not present

## 2017-04-11 LAB — URINALYSIS, ROUTINE W REFLEX MICROSCOPIC
BILIRUBIN URINE: NEGATIVE
Bacteria, UA: NONE SEEN
GLUCOSE, UA: NEGATIVE mg/dL
HGB URINE DIPSTICK: NEGATIVE
KETONES UR: NEGATIVE mg/dL
LEUKOCYTES UA: NEGATIVE
Nitrite: NEGATIVE
PH: 5 (ref 5.0–8.0)
Protein, ur: 30 mg/dL — AB
Specific Gravity, Urine: 1.02 (ref 1.005–1.030)

## 2017-04-11 LAB — CBC WITH DIFFERENTIAL/PLATELET
BASOS ABS: 0 10*3/uL (ref 0.0–0.1)
Basophils Relative: 0 %
EOS PCT: 0 %
Eosinophils Absolute: 0 10*3/uL (ref 0.0–0.7)
HCT: 35.5 % — ABNORMAL LOW (ref 39.0–52.0)
Hemoglobin: 11.3 g/dL — ABNORMAL LOW (ref 13.0–17.0)
LYMPHS PCT: 5 %
Lymphs Abs: 0.7 10*3/uL (ref 0.7–4.0)
MCH: 26.5 pg (ref 26.0–34.0)
MCHC: 31.8 g/dL (ref 30.0–36.0)
MCV: 83.3 fL (ref 78.0–100.0)
MONO ABS: 1.4 10*3/uL — AB (ref 0.1–1.0)
Monocytes Relative: 10 %
Neutro Abs: 12.1 10*3/uL — ABNORMAL HIGH (ref 1.7–7.7)
Neutrophils Relative %: 85 %
Platelets: 218 10*3/uL (ref 150–400)
RBC: 4.26 MIL/uL (ref 4.22–5.81)
RDW: 15.2 % (ref 11.5–15.5)
WBC: 14.1 10*3/uL — AB (ref 4.0–10.5)

## 2017-04-11 LAB — BASIC METABOLIC PANEL
ANION GAP: 8 (ref 5–15)
ANION GAP: 10 (ref 5–15)
BUN: 16 mg/dL (ref 6–20)
BUN: 17 mg/dL (ref 6–20)
CALCIUM: 8 mg/dL — AB (ref 8.9–10.3)
CHLORIDE: 104 mmol/L (ref 101–111)
CO2: 19 mmol/L — ABNORMAL LOW (ref 22–32)
CO2: 20 mmol/L — AB (ref 22–32)
CREATININE: 0.98 mg/dL (ref 0.61–1.24)
Calcium: 8.7 mg/dL — ABNORMAL LOW (ref 8.9–10.3)
Chloride: 108 mmol/L (ref 101–111)
Creatinine, Ser: 0.93 mg/dL (ref 0.61–1.24)
GFR calc Af Amer: >60 mL/min (ref >60)
GFR calc Af Amer: >60 mL/min (ref >60)
GFR calc non Af Amer: >60 mL/min (ref >60)
GLUCOSE: 123 mg/dL — AB (ref 65–99)
Glucose, Bld: 122 mg/dL — ABNORMAL HIGH (ref 65–99)
Potassium: 4.1 mmol/L (ref 3.5–5.1)
Potassium: 4.2 mmol/L (ref 3.5–5.1)
SODIUM: 134 mmol/L — AB (ref 135–145)
Sodium: 135 mmol/L (ref 135–145)

## 2017-04-11 LAB — CBC
HCT: 31.8 % — ABNORMAL LOW (ref 39.0–52.0)
Hemoglobin: 10.1 g/dL — ABNORMAL LOW (ref 13.0–17.0)
MCH: 26.4 pg (ref 26.0–34.0)
MCHC: 31.8 g/dL (ref 30.0–36.0)
MCV: 83 fL (ref 78.0–100.0)
PLATELETS: 177 10*3/uL (ref 150–400)
RBC: 3.83 MIL/uL — ABNORMAL LOW (ref 4.22–5.81)
RDW: 15.3 % (ref 11.5–15.5)
WBC: 11.3 10*3/uL — AB (ref 4.0–10.5)

## 2017-04-11 LAB — I-STAT CG4 LACTIC ACID, ED: Lactic Acid, Venous: 0.8 mmol/L (ref 0.5–1.9)

## 2017-04-11 LAB — TROPONIN I
Troponin I: <0.03 ng/mL (ref <0.03)
Troponin I: <0.03 ng/mL (ref <0.03)

## 2017-04-11 LAB — ETHANOL: Alcohol, Ethyl (B): <5 mg/dL (ref <5)

## 2017-04-11 MED ORDER — ACETAMINOPHEN 650 MG RE SUPP: 650.0000 mg | Freq: Four times a day (QID) | RECTAL | Status: DC | PRN

## 2017-04-11 MED ORDER — TIOTROPIUM BROMIDE MONOHYDRATE 18 MCG IN CAPS
18.0000 ug | ORAL_CAPSULE | Freq: Every day | RESPIRATORY_TRACT | Status: DC
  Administered 2017-04-11 – 2017-04-12 (×2): 18 ug via RESPIRATORY_TRACT
  Filled 2017-04-11: qty 5

## 2017-04-11 MED ORDER — ASPIRIN EC 81 MG PO TBEC
81.0000 mg | DELAYED_RELEASE_TABLET | Freq: Every day | ORAL | Status: DC
  Administered 2017-04-11 – 2017-04-12 (×2): 81 mg via ORAL
  Filled 2017-04-11 (×2): qty 1

## 2017-04-11 MED ORDER — METOPROLOL SUCCINATE ER 50 MG PO TB24
50.0000 mg | ORAL_TABLET | Freq: Every day | ORAL | Status: DC
  Administered 2017-04-11 – 2017-04-12 (×2): 50 mg via ORAL
  Filled 2017-04-11 (×3): qty 1

## 2017-04-11 MED ORDER — SODIUM CHLORIDE 0.9 % IV BOLUS (SEPSIS)
1000.0000 mL | Freq: Once | INTRAVENOUS | Status: AC
  Administered 2017-04-11: 1000 mL via INTRAVENOUS

## 2017-04-11 MED ORDER — ACETAMINOPHEN 325 MG PO TABS: 650.0000 mg | ORAL_TABLET | Freq: Four times a day (QID) | ORAL | Status: DC | PRN

## 2017-04-11 MED ORDER — SODIUM CHLORIDE 0.9 % IV SOLN
Freq: Once | INTRAVENOUS | Status: AC
  Administered 2017-04-11: 03:00:00 via INTRAVENOUS

## 2017-04-11 MED ORDER — ENOXAPARIN SODIUM 40 MG/0.4ML ~~LOC~~ SOLN
40.0000 mg | Freq: Every day | SUBCUTANEOUS | Status: DC
  Administered 2017-04-11 – 2017-04-12 (×2): 40 mg via SUBCUTANEOUS
  Filled 2017-04-11 (×3): qty 0.4

## 2017-04-11 MED ORDER — LISINOPRIL 10 MG PO TABS
10.0000 mg | ORAL_TABLET | Freq: Every day | ORAL | Status: DC
  Administered 2017-04-11 – 2017-04-12 (×2): 10 mg via ORAL
  Filled 2017-04-11 (×2): qty 1

## 2017-04-11 MED ORDER — SODIUM CHLORIDE 0.9% FLUSH
3.0000 mL | Freq: Two times a day (BID) | INTRAVENOUS | Status: DC
  Administered 2017-04-11 – 2017-04-12 (×2): 3 mL via INTRAVENOUS

## 2017-04-11 MED ORDER — DULOXETINE HCL 60 MG PO CPEP
60.0000 mg | ORAL_CAPSULE | Freq: Every day | ORAL | Status: DC
  Administered 2017-04-11 – 2017-04-12 (×2): 60 mg via ORAL
  Filled 2017-04-11 (×3): qty 1

## 2017-04-11 NOTE — H&P (Addendum)
History and Physical  Patient Name: Julian Zimmerman     MVE:[id number]    DOB: 09-12-1946    DOA: 04/10/2017 PCP: Patient, No Pcp Per  Patient coming from: Home  Chief Complaint: Fall, syncope      HPI: Julian Zimmerman is a 71 y.o. male with a past medical history significant for CHF/NICM with EF 20%, COPD, advanced tongue Ca, active smoker who presents with syncope.  The patient has been in [place] visiting his son, ate a good breakfast but then didn't eat lunch and traveled from [place] through [place] and had a full day of travel.  On the second leg of the flight, he did have chills that required the flight attendant to bring him a heated water bottle, which didn't help.  Then at home, wife noticed he seemed "a little out of it".  Shortly after that she was downstairs and he was up getting ready for bed when she heard a "crash", came up and found him on the head, large gash in his forehead, and he appeared dazed and "staring off".  When EMS arrived his initial BP was 80/40 and he was diaphoretic, he got some fluids en route.  He has had a persistent productive cough since May, didn't respond to Levaquin/Medrol dosepak from his Pulm in May.  Other than that and the chills and confusion mentioned above, wife thought he had been in his normal health at his son's house this week.  There was no prodrome before the fall, according to patient.  ED course: -Afebrile, heart rate 101, respirations 22, blood pressure 115/63, pulse ox normal -He was orthostatic to 88/50 with standing -Na 134, K 4.1, Cr 0.98 (baseline 1.0), WBC 14.1K, Hgb 11.3 -Troponin negative -Lacration had large piece of glass removed, and was sutured -CT head unremarkable, showed sinusitis only -ECG showed sinus tachycardia, otherwise no change from previous -He was given 1.5L fluids and TRH were asked to evaluate for syncope    Of note, he recently started sildenafil 20 daily for erectile dysfunction, is taking this every day,  scheduled, or at least every day for about the last two weeks.  He has not been taking furosemide lately.  He did have two drinks on the plane today.  His last echo was in 2015 and showed EF 20%.  He does not have an ICD.      ROS: Review of Systems  Constitutional: Positive for chills and diaphoresis.  Musculoskeletal: Positive for falls.  Neurological: Positive for loss of consciousness.          Past Medical History:  Diagnosis Date  . Arthritis    "right leg" (03/23/2014)  . Chronic systolic congestive heart failure ([employer])   . Gout    "right leg" (03/23/2014)  . HTN (hypertension)   . Oral cancer ([place])   . Tobacco abuse     Past Surgical History:  Procedure Laterality Date  . ESOPHAGOGASTRODUODENOSCOPY N/A 12/26/2014   Procedure: ESOPHAGOGASTRODUODENOSCOPY (EGD);  Surgeon: Arta Silence, MD;  Location: [facility];  Service: Endoscopy;  Laterality: N/A;  . FLOOR OF MOUTH BIOPSY  03/2014   "found cancer"  . FOREIGN BODY REMOVAL N/A 10/12/2014   Procedure: FOREIGN BODY REMOVAL;  Surgeon: Jeryl Columbia, MD;  Location: [facility] ENDOSCOPY;  Service: Endoscopy;  Laterality: N/A;  . FOREIGN BODY REMOVAL N/A 12/26/2014   Procedure: FOREIGN BODY REMOVAL;  Surgeon: Arta Silence, MD;  Location: [facility] ENDOSCOPY;  Service: Endoscopy;  Laterality: N/A;  . FRACTIONAL FLOW RESERVE WIRE  03/27/2014   Procedure: FRACTIONAL FLOW RESERVE WIRE;  Surgeon: Jettie Booze, MD;  Location: [facility] CATH LAB;  Service: Cardiovascular;;  . LEFT AND RIGHT HEART CATHETERIZATION WITH CORONARY ANGIOGRAM N/A 03/27/2014   Procedure: LEFT AND RIGHT HEART CATHETERIZATION WITH CORONARY ANGIOGRAM;  Surgeon: Jettie Booze, MD;  Location: [facility] LAB;  Service: Cardiovascular;  Laterality: N/A;  . TONSILLECTOMY AND ADENOIDECTOMY  1955    Social History: Patient lives with his wfie.  The patient walks unassisted.  Smoker.  Drinks alcohol, including two on the plane today.  From [place] originally.  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  No Known Allergies  Family history: family history includes Diabetes in his father and mother.  Prior to Admission medications   Medication Sig Start Date End Date Taking? Authorizing Provider  ammonium lactate (LAC-HYDRIN) 12 % lotion Apply 1 application topically 2 (two) times daily as needed for dry skin.   Yes [provider]  aspirin EC 81 MG EC tablet Take 1 tablet (81 mg total) by mouth daily. 03/28/14  Yes Lucious Groves, DO  bisacodyl (DULCOLAX) 5 MG EC tablet Take 5 mg by mouth daily as needed for mild constipation.    Yes [provider]  DULoxetine (CYMBALTA) 30 MG capsule Take 2 capsules everyday in the morning. 02/19/17 06/22/17 Yes [provider]  furosemide (LASIX) 20 MG tablet Take 20 mg by mouth daily as needed (swelling in legs or feet).   Yes [provider]  metoprolol succinate (TOPROL-XL) 50 MG 24 hr tablet Take 50 mg by mouth daily. 12/10/14  Yes [provider]  polyethylene glycol (MIRALAX / GLYCOLAX) packet Take 17 g by mouth daily as needed for mild constipation.    Yes [provider]  sildenafil (REVATIO) 20 MG tablet Take 20-40 mg by mouth daily as needed (ED).   Yes [provider]  tiotropium (SPIRIVA HANDIHALER) 18 MCG inhalation capsule Place 1 capsule (18 mcg total) into inhaler and inhale daily. 12/18/16  Yes Noralee Space, MD  vitamin B-12 (CYANOCOBALAMIN) 1000 MCG tablet Take 1,000 mcg by mouth daily.   Yes [provider]       Physical Exam: BP 109/71   Pulse 84   Temp 98.1 F (36.7 C) (Oral)   Resp 17   SpO2 97%  General appearance: Thin, ill-appearing adult male, sleeping, rousable, very drowsy, no obvious distress .   Eyes: Anicteric, conjunctiva pink, lids and lashes normal. PERRL.    ENT: No nasal deformity, discharge, epistaxis.  Hearing normal. OP dry   Neck: No neck masses.  Trachea midline.  No thyromegaly/tenderness. Lymph:  No cervical or supraclavicular lymphadenopathy. Skin: Warm and dry.  No suspicious rashes or lesions. Cardiac: Tachycardic, regular, nl S1-S2, no murmurs appreciated.  Capillary refill is brisk.  JVP not elevatd.  No LE edema.  Radial pulses 2+ and symmetric. Respiratory: Normal respiratory rate and rhythm.  CTAB without rales or wheezes. Abdomen: Abdomen soft.  No TTP. No ascites, distension, hepatosplenomegaly.   MSK: No deformities or effusions.  No cyanosis or clubbing.  Diffuse loss of muscle mass. Neuro: Cranial nerves 3-12 intact. Speech is fluent.  Muscle strength globally weak but symmetric.    Psych: Sensorium intact and responding to questions, attention diminshed, sleepy.  Behavior appropriate.  Affect blunted.  Judgment and insight appear normal.     Labs on Admission:  I have personally reviewed following labs and imaging studies: CBC:  Recent Labs  Lab 04/10/17 2355  WBC 14.1*  NEUTROABS 12.1*  HGB 11.3*  HCT 35.5*  MCV 83.3  PLT 932   Basic Metabolic Panel:  Recent Labs Lab 04/10/17 2355  NA 134*  K 4.1  CL 104  CO2 20*  GLUCOSE 122*  BUN 16  CREATININE 0.98  CALCIUM 8.7*   GFR: CrCl cannot be calculated (Unknown ideal weight.).  Cardiac Enzymes:  Recent Labs Lab 04/10/17 2355  TROPONINI <0.03   BNP (last 3 results)  Recent Labs  02/08/17 1545  PROBNP 26.0        Radiological Exams on Admission: Personally reviewed CXR shows emphysema, no focal opacity to suggest pneumonia; CT head report reviewed: Dg Chest 2 View  Result Date: 04/11/2017 CLINICAL DATA:  Fall, laceration. EXAM: CHEST  2 VIEW COMPARISON:  Radiographs and CT 02/08/2017 FINDINGS: Again seen hyperinflation and emphysema. Unchanged heart size and mediastinal contours with atherosclerosis of the thoracic aorta. No pulmonary edema, consolidation, pleural fluid or pneumothorax. No acute osseous abnormalities. Site of laceration is not seen radiographically. IMPRESSION: 1. No acute  abnormality. 2. Emphysema and thoracic aortic atherosclerosis. Electronically Signed   By: Jeb Levering M.D.   On: 04/11/2017 01:07   Ct Head Wo Contrast  Result Date: 04/10/2017 CLINICAL DATA:  Status post fall, hitting left side of head. Concern for head or cervical spine injury. Initial encounter. EXAM: CT HEAD WITHOUT CONTRAST CT CERVICAL SPINE WITHOUT CONTRAST TECHNIQUE: Multidetector CT imaging of the head and cervical spine was performed following the standard protocol without intravenous contrast. Multiplanar CT image reconstructions of the cervical spine were also generated. COMPARISON:  MRI of the brain from 11/29/2007, and CT of the neck performed 03/14/2014 FINDINGS: CT HEAD FINDINGS Brain: No evidence of acute infarction, hemorrhage, hydrocephalus, extra-axial collection or mass lesion/mass effect. Prominence of the ventricles and sulci reflects mild cortical volume loss. Mild cerebellar atrophy is noted. Scattered periventricular white matter change likely reflects small vessel ischemic microangiopathy. The brainstem and fourth ventricle are within normal limits. The basal ganglia are unremarkable in appearance. The cerebral hemispheres demonstrate grossly normal gray-white differentiation. No mass effect or midline shift is seen. Vascular: No hyperdense vessel or unexpected calcification. Skull: There is no evidence of fracture; visualized osseous structures are unremarkable in appearance. Sinuses/Orbits: The orbits are within normal limits. There is near complete opacification of the mastoid air cells bilaterally. The paranasal sinuses are well-aerated. Other: No significant soft tissue abnormalities are seen. CT CERVICAL SPINE FINDINGS Alignment: Normal. Skull base and vertebrae: No acute fracture. No primary bone lesion or focal pathologic process. Soft tissues and spinal canal: No prevertebral fluid or swelling. No visible canal hematoma. Disc levels: Mild disc space narrowing is noted  along the lower cervical spine, with prominent anterior osteophytes. Mild degenerative change is noted about the dens. Facet disease is noted along the mid cervical spine. Upper chest: Emphysema is noted at the lung apices. The thyroid gland is unremarkable. Calcification is noted at the carotid bifurcations bilaterally. Other: No additional soft tissue abnormalities are seen. IMPRESSION: 1. No evidence of traumatic intracranial injury or fracture. 2. No evidence of acute fracture or subluxation along the cervical spine. 3. Mild cortical volume loss and scattered small vessel ischemic microangiopathy. 4. Mild degenerative change noted along the cervical spine. 5. Emphysema at the lung apices. 6. Calcification at the carotid bifurcations bilaterally. Carotid ultrasound would be helpful for further evaluation, when and as deemed clinically appropriate. 7. Near complete opacification of the mastoid air cells bilaterally. Electronically  Signed   By: Garald Balding M.D.   On: 04/10/2017 23:57   Ct Cervical Spine Wo Contrast  Result Date: 04/10/2017 CLINICAL DATA:  Status post fall, hitting left side of head. Concern for head or cervical spine injury. Initial encounter. EXAM: CT HEAD WITHOUT CONTRAST CT CERVICAL SPINE WITHOUT CONTRAST TECHNIQUE: Multidetector CT imaging of the head and cervical spine was performed following the standard protocol without intravenous contrast. Multiplanar CT image reconstructions of the cervical spine were also generated. COMPARISON:  MRI of the brain from 11/29/2007, and CT of the neck performed 03/14/2014 FINDINGS: CT HEAD FINDINGS Brain: No evidence of acute infarction, hemorrhage, hydrocephalus, extra-axial collection or mass lesion/mass effect. Prominence of the ventricles and sulci reflects mild cortical volume loss. Mild cerebellar atrophy is noted. Scattered periventricular white matter change likely reflects small vessel ischemic microangiopathy. The brainstem and fourth  ventricle are within normal limits. The basal ganglia are unremarkable in appearance. The cerebral hemispheres demonstrate grossly normal gray-white differentiation. No mass effect or midline shift is seen. Vascular: No hyperdense vessel or unexpected calcification. Skull: There is no evidence of fracture; visualized osseous structures are unremarkable in appearance. Sinuses/Orbits: The orbits are within normal limits. There is near complete opacification of the mastoid air cells bilaterally. The paranasal sinuses are well-aerated. Other: No significant soft tissue abnormalities are seen. CT CERVICAL SPINE FINDINGS Alignment: Normal. Skull base and vertebrae: No acute fracture. No primary bone lesion or focal pathologic process. Soft tissues and spinal canal: No prevertebral fluid or swelling. No visible canal hematoma. Disc levels: Mild disc space narrowing is noted along the lower cervical spine, with prominent anterior osteophytes. Mild degenerative change is noted about the dens. Facet disease is noted along the mid cervical spine. Upper chest: Emphysema is noted at the lung apices. The thyroid gland is unremarkable. Calcification is noted at the carotid bifurcations bilaterally. Other: No additional soft tissue abnormalities are seen. IMPRESSION: 1. No evidence of traumatic intracranial injury or fracture. 2. No evidence of acute fracture or subluxation along the cervical spine. 3. Mild cortical volume loss and scattered small vessel ischemic microangiopathy. 4. Mild degenerative change noted along the cervical spine. 5. Emphysema at the lung apices. 6. Calcification at the carotid bifurcations bilaterally. Carotid ultrasound would be helpful for further evaluation, when and as deemed clinically appropriate. 7. Near complete opacification of the mastoid air cells bilaterally. Electronically Signed   By: Garald Balding M.D.   On: 04/10/2017 23:57    EKG: Independently reviewed. Rate 100, QTc 425, no change  from previous.  Echocardiogram 2015: Report reviewed EF 20-25%              Assessment/Plan  1. Syncope:  Differential includes VT in setting of NICM/reduced LVF vs more benign cause (sildenafil causing orthostasis, alcohol causing orthostasis, or dehydration/vagal episode from not eating all day).    -Gentle IV fluids -Monitor for ventricular tachycardias on tele -Obtain echocardiogram -Trend troponin -Consult to Cardiology -Hold sildenafil   2. COPD:  -Continue Spiriva  3. Hyponatremia:  Mild  4. Anemia:  Mild  5. Chronic systolic CHF from an ICM:  EF 20% -Monitor ins and outs -Hold furosemide -Continue metoprolol -Continue aspirin -Obtain echocardiogram  6. Other medications:  -Continue duloxetine -Hold sildenafil  7. Leukocytosis: No focal findings to suggest infection.  BP stable. -Obtain cultures -Check lactic acid        DVT prophylaxis: Lovenox  Code Status: DNR  Family Communication: Wife at bedsdie, overnight plan discussed, CODE STATUS confirmed.  Disposition Plan: Anticipate IV fluids, check troponins, echo.  Consult to Cardiology. Consults called: None overnight Admission status: OBS At the point of initial evaluation, it is my clinical opinion that admission for OBSERVATION is reasonable and necessary because the patient's presenting complaints in the context of their chronic conditions represent sufficient risk of deterioration or significant morbidity to constitute reasonable grounds for close observation in the hospital setting, but that the patient may be medically stable for discharge from the hospital within 24 to 48 hours.    Medical decision making: Patient seen at 1:20 AM on 04/11/2017.  The patient was discussed with Dr. Wyvonnia Dusky.  What exists of the patient's chart was reviewed in depth and summarized above.  Clinical condition: stable.        Edwin Dada [facility] Pager [phone]

## 2017-04-11 NOTE — ED Notes (Signed)
Attempted to call report

## 2017-04-11 NOTE — Progress Notes (Signed)
PT Cancellation Note  Patient Details Name: NICANDRO PERRAULT MRN: [id number] DOB: 1946/07/07   Cancelled Treatment:    Reason Eval/Treat Not Completed: Patient declined, no reason specified. Pt reports that he wants to rest and does not want to work with therapy. Report he feels he is moving fine. Advised PT will check back tomorrow AM.    Scheryl Marten PT, DPT  [phone]  04/11/2017, 3:17 PM

## 2017-04-11 NOTE — ED Notes (Signed)
Pt transferred onto an inpatient bed for comfort. Pt was 2 person assist while standing in room.

## 2017-04-11 NOTE — Consult Note (Signed)
Cardiology Consultation:   Patient ID: Julian Zimmerman; [id number]; 11-19-1945   Admit date: 04/10/2017 Date of Consult: 04/11/2017  Primary Care Provider: Patient, No Pcp Per Primary Cardiologist: Priscella Mann Primary Electrophysiologist:  new   Patient Profile:   Julian Zimmerman is a 71 y.o. male with a hx of NICM who is being seen today for the evaluation of syncope at the request of Myrene Buddy.  History of Present Illness:   Julian Zimmerman a 71 y.o. 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 He had been endotracheally visiting his son. He he did not eat lunch on the trip back. He traveled from [place] to our [place] and had a full day of travel. On the second night of the flight he had chills called a flight attendant to bring a bottle of water which did not help. When at home, his wife noted that he seemed a little out of it. His wife heard a large crash found him on the bed with a large gash in his forehead he appeared dazed staring off. EMS arrived with a blood pressure of 80/40. He was diaphoretic. In the emergency room, he was orthostatic at 88/50 when standing.  Per his most recent cardiology visit, his ejection fraction is 50-55% on an echo in 2016, increased from his echo in 2015. Patient says that he felt well prior to his episode of syncope. He did not note chest pains or palpitations. After his episode of syncope, he says he felt confused about passing out. He did not note diaphoresis chest pain or palpitations.   Past Medical History:  Diagnosis Date  . Arthritis    "right leg" (03/23/2014)  . Chronic systolic congestive heart failure ([place])   . Gout    "right leg" (03/23/2014)  . HTN (hypertension)   . Oral cancer ([place])   . Tobacco abuse     Past Surgical History:  Procedure Laterality Date  .  ESOPHAGOGASTRODUODENOSCOPY N/A 12/26/2014   Procedure: ESOPHAGOGASTRODUODENOSCOPY (EGD);  Surgeon: Arta Silence, MD;  Location: [facility];  Service: Endoscopy;  Laterality: N/A;  . FLOOR OF MOUTH BIOPSY  03/2014   "found cancer"  . FOREIGN BODY REMOVAL N/A 10/12/2014   Procedure: FOREIGN BODY REMOVAL;  Surgeon: Jeryl Columbia, MD;  Location: [facility];  Service: Endoscopy;  Laterality: N/A;  . FOREIGN BODY REMOVAL N/A 12/26/2014   Procedure: FOREIGN BODY REMOVAL;  Surgeon: Arta Silence, MD;  Location: [facility];  Service: Endoscopy;  Laterality: N/A;  . FRACTIONAL FLOW RESERVE WIRE  03/27/2014   Procedure: FRACTIONAL FLOW RESERVE WIRE;  Surgeon: Jettie Booze, MD;  Location: [facility] CATH LAB;  Service: Cardiovascular;;  . LEFT AND RIGHT HEART CATHETERIZATION WITH CORONARY ANGIOGRAM N/A 03/27/2014   Procedure: LEFT AND RIGHT HEART CATHETERIZATION WITH CORONARY ANGIOGRAM;  Surgeon: Jettie Booze, MD;  Location: [facility] CATH LAB;  Service: Cardiovascular;  Laterality: N/A;  . TONSILLECTOMY AND ADENOIDECTOMY  1955     Inpatient Medications: Scheduled Meds: . aspirin EC  81 mg Oral Daily  . DULoxetine  60 mg Oral Daily  . enoxaparin (LOVENOX) injection  40 mg Subcutaneous Daily  . metoprolol succinate  50 mg Oral Daily  . sodium chloride flush  3 mL Intravenous Q12H  . tiotropium  18 mcg Inhalation Daily   Continuous Infusions:  PRN Meds: acetaminophen **OR** acetaminophen  Allergies:   No Known Allergies  Social History:   Social History   Social History  . Marital status: Married    Spouse name: N/A  . Number of children: N/A  . Years of education: N/A   Occupational History  . retired [place]    "making uranium pelets"   .  [place] History Main Topics  . Smoking status: Current Every Day Smoker    Packs/day: 1.00    Years: 50.00    Types: Cigarettes  . Smokeless tobacco: Never Used  . Alcohol use 16.8 oz/week     28 Shots of liquor per week     Comment: 03/23/2014 "4, 1oz shots/day"  . Drug use: No  . Sexual activity: Not Currently   Other Topics Concern  . Not on file   Social History Narrative   Lives with wife in a 3 story home.  Has 3 children.  Retired Librarian, academic.  Education: college.     Family History:   The patient's family history includes Diabetes in his father and mother.  ROS:  Please see the history of present illness.  ROS  All other ROS reviewed and negative.     Physical Exam/Data:   Vitals:   04/11/17 0800 04/11/17 0845 04/11/17 0848 04/11/17 0902  BP: (!) 142/80  (!) 152/74   Pulse: 68  76   Resp: 17  20   Temp:   98 F (36.7 C)   TempSrc:   Oral   SpO2: 99%  97% 96%  Weight:  161 lb 9.6 oz (73.3 kg)    Height:  6' (1.829 m)     No intake or output data in the 24 hours ending 04/11/17 0946 Filed Weights   04/11/17 0845  Weight: 161 lb 9.6 oz (73.3 kg)   Body mass index is 21.92 kg/m.  General:  Well nourished, well developed, in no acute distress HEENT: normal Lymph: no adenopathy Neck: no JVD Endocrine:  No thryomegaly Vascular: No carotid bruits; FA pulses 2+ bilaterally without bruits  Cardiac:  normal S1, S2; RRR; no murmur  Lungs:  clear to auscultation bilaterally, no wheezing, rhonchi or rales  Abd: soft, nontender, no hepatomegaly  Ext: no edema Musculoskeletal:  No deformities, BUE and BLE strength normal and equal Skin: warm and dry  Neuro:  CNs 2-12 intact, no focal abnormalities noted Psych:  Normal affect   EKG:  The EKG was personally reviewed and demonstrates:  Sinus rhythm, rate 100, early R/S transition Telemetry:  Telemetry was personally reviewed and demonstrates:  Sinus rhythm  Relevant CV Studies: TTE 2015 - Left ventricle: The cavity size was mildly dilated. Wall thickness was normal. Systolic function was severely reduced. The estimated ejection fraction was in the range of 20% to 25%. - Mitral valve: There was mild  regurgitation. - Left atrium: The atrium was moderately dilated. - Right ventricle: The cavity size was mildly dilated. - Right atrium: The atrium was mildly dilated. - Tricuspid valve: There was moderate regurgitation. - Pulmonary arteries: PA peak pressure: 52 mm Hg (S).  12/10/14 ECHO: Clinical Diagnoses and Echocardiographic Findings Limited echo to evaluate left ventricular ejection fraction. Contractile left ventricular dysfunction (mild) Low normal left ventricular ejection fraction (50-55%)  Laboratory Data:  Chemistry Recent Labs Lab 04/10/17 2355 04/11/17 0515  NA 134* 135  K 4.1 4.2  CL 104 108  CO2 20* 19*  GLUCOSE 122* 123*  BUN 16 17  CREATININE 0.98 0.93  CALCIUM 8.7* 8.0*  GFRNONAA >60 >60  GFRAA >60 >60  ANIONGAP 10 8    No results for input(s): PROT, ALBUMIN, AST, ALT, ALKPHOS, BILITOT in the last 168 hours. Hematology Recent Labs Lab 04/10/17 2355 04/11/17 0515  WBC 14.1* 11.3*  RBC 4.26 3.83*  HGB 11.3* 10.1*  HCT 35.5* 31.8*  MCV 83.3 83.0  MCH 26.5 26.4  MCHC 31.8 31.8  RDW 15.2 15.3  PLT 218 177   Cardiac Enzymes Recent Labs Lab 04/10/17 2355 04/11/17 0515  TROPONINI <0.03 <0.03   No results for input(s): TROPIPOC in the last 168 hours.  BNPNo results for input(s): BNP, PROBNP in the last 168 hours.  DDimer No results for input(s): DDIMER in the last 168 hours.  Radiology/Studies:  Dg Chest 2 View  Result Date: 04/11/2017 CLINICAL DATA:  Fall, laceration. EXAM: CHEST  2 VIEW COMPARISON:  Radiographs and CT 02/08/2017 FINDINGS: Again seen hyperinflation and emphysema. Unchanged heart size and mediastinal contours with atherosclerosis of the thoracic aorta. No pulmonary edema, consolidation, pleural fluid or pneumothorax. No acute osseous abnormalities. Site of laceration is not seen radiographically. IMPRESSION: 1. No acute abnormality. 2. Emphysema and thoracic aortic atherosclerosis. Electronically Signed   By: Jeb Levering M.D.    On: 04/11/2017 01:07   Ct Head Wo Contrast  Result Date: 04/10/2017 CLINICAL DATA:  Status post fall, hitting left side of head. Concern for head or cervical spine injury. Initial encounter. EXAM: CT HEAD WITHOUT CONTRAST CT CERVICAL SPINE WITHOUT CONTRAST TECHNIQUE: Multidetector CT imaging of the head and cervical spine was performed following the standard protocol without intravenous contrast. Multiplanar CT image reconstructions of the cervical spine were also generated. COMPARISON:  MRI of the brain from 11/29/2007, and CT of the neck performed 03/14/2014 FINDINGS: CT HEAD FINDINGS Brain: No evidence of acute infarction, hemorrhage, hydrocephalus, extra-axial collection or mass lesion/mass effect. Prominence of the ventricles and sulci reflects mild cortical volume loss. Mild cerebellar atrophy is noted. Scattered periventricular white matter change likely reflects small vessel ischemic microangiopathy. The brainstem and fourth ventricle are within normal limits. The basal ganglia are unremarkable in appearance. The cerebral hemispheres demonstrate grossly normal gray-white differentiation. No mass effect or midline shift is seen. Vascular: No hyperdense vessel or unexpected calcification. Skull: There is no evidence of fracture; visualized osseous structures are unremarkable in appearance. Sinuses/Orbits: The orbits are within normal limits. There is near complete opacification of the mastoid air cells bilaterally. The paranasal sinuses are well-aerated. Other: No significant soft tissue abnormalities are seen. CT CERVICAL SPINE FINDINGS Alignment: Normal. Skull base and vertebrae: No acute fracture. No primary bone lesion or focal pathologic process. Soft tissues and spinal canal: No prevertebral fluid or swelling. No visible canal hematoma. Disc levels: Mild disc space narrowing is noted along the lower cervical spine, with prominent anterior osteophytes. Mild degenerative change is noted about the dens.  Facet disease is noted along the mid cervical spine. Upper chest: Emphysema is noted at the lung apices. The thyroid gland is unremarkable. Calcification is noted at the carotid bifurcations bilaterally. Other: No additional soft tissue abnormalities are seen. IMPRESSION: 1. No evidence of traumatic intracranial injury or fracture. 2. No evidence of acute fracture or subluxation along the cervical spine. 3. Mild cortical volume loss and scattered small vessel ischemic microangiopathy. 4. Mild degenerative change noted along the cervical spine. 5. Emphysema at the lung apices. 6. Calcification at the carotid bifurcations bilaterally. Carotid ultrasound would be helpful for further evaluation, when  and as deemed clinically appropriate. 7. Near complete opacification of the mastoid air cells bilaterally. Electronically Signed   By: Garald Balding M.D.   On: 04/10/2017 23:57   Ct Cervical Spine Wo Contrast  Result Date: 04/10/2017 CLINICAL DATA:  Status post fall, hitting left side of head. Concern for head or cervical spine injury. Initial encounter. EXAM: CT HEAD WITHOUT CONTRAST CT CERVICAL SPINE WITHOUT CONTRAST TECHNIQUE: Multidetector CT imaging of the head and cervical spine was performed following the standard protocol without intravenous contrast. Multiplanar CT image reconstructions of the cervical spine were also generated. COMPARISON:  MRI of the brain from 11/29/2007, and CT of the neck performed 03/14/2014 FINDINGS: CT HEAD FINDINGS Brain: No evidence of acute infarction, hemorrhage, hydrocephalus, extra-axial collection or mass lesion/mass effect. Prominence of the ventricles and sulci reflects mild cortical volume loss. Mild cerebellar atrophy is noted. Scattered periventricular white matter change likely reflects small vessel ischemic microangiopathy. The brainstem and fourth ventricle are within normal limits. The basal ganglia are unremarkable in appearance. The cerebral hemispheres demonstrate  grossly normal gray-white differentiation. No mass effect or midline shift is seen. Vascular: No hyperdense vessel or unexpected calcification. Skull: There is no evidence of fracture; visualized osseous structures are unremarkable in appearance. Sinuses/Orbits: The orbits are within normal limits. There is near complete opacification of the mastoid air cells bilaterally. The paranasal sinuses are well-aerated. Other: No significant soft tissue abnormalities are seen. CT CERVICAL SPINE FINDINGS Alignment: Normal. Skull base and vertebrae: No acute fracture. No primary bone lesion or focal pathologic process. Soft tissues and spinal canal: No prevertebral fluid or swelling. No visible canal hematoma. Disc levels: Mild disc space narrowing is noted along the lower cervical spine, with prominent anterior osteophytes. Mild degenerative change is noted about the dens. Facet disease is noted along the mid cervical spine. Upper chest: Emphysema is noted at the lung apices. The thyroid gland is unremarkable. Calcification is noted at the carotid bifurcations bilaterally. Other: No additional soft tissue abnormalities are seen. IMPRESSION: 1. No evidence of traumatic intracranial injury or fracture. 2. No evidence of acute fracture or subluxation along the cervical spine. 3. Mild cortical volume loss and scattered small vessel ischemic microangiopathy. 4. Mild degenerative change noted along the cervical spine. 5. Emphysema at the lung apices. 6. Calcification at the carotid bifurcations bilaterally. Carotid ultrasound would be helpful for further evaluation, when and as deemed clinically appropriate. 7. Near complete opacification of the mastoid air cells bilaterally. Electronically Signed   By: Garald Balding M.D.   On: 04/10/2017 23:57    Assessment and Plan:   1. Syncope: Currently unclear cause for syncope. His EKG and telemetry did not show any arrhythmia. Agree with a repeat echocardiogram to see if his ejection  fraction has decreased since 2016, which was low normal. If his ejection fraction remains stable, and no arrhythmias seen on the monitor, he may benefit from outpatient cardiac monitoring. Due to his episode of syncope, he should not drive for 6 months per [facility].  2. Systolic heart failure, improved by echo in 2016: blood pressure is currently elevated, and he is not on optimal medical therapy for his history of heart failure. We'll start him on 10 mg of lisinopril today.  3. Tobacco abuse: Cessation advised   Signed, Neli Fofana Meredith Leeds, MD  04/11/2017 9:46 AM

## 2017-04-11 NOTE — ED Notes (Signed)
Pt helped with breakfast set up as he has tremor per norm. Dressing noted at forehead with no bleading noted.

## 2017-04-11 NOTE — Progress Notes (Signed)
Patient was admitted early this AM after midnight and H and P has been reviewed and I am in agreement with the Assessment and Plan done by Dr. Loleta Books. The patient is a 71 yo AAM with a PMH of Sysotlic CHF/NICM with an EF of 20% that is improved to 50-55% on ECHO, COPD, HTN, Oral squamous cell carcinoma, Tobacco Abuse, Gout, and other comorbids who presented to [facility] with Syncope. Cardiology was consulted because of his Hx of NICM with an EF of 20% and an ECHOCardiogram was ordered. Patient's Cardiac Troponin's have been <0.03 x2 so far. Per Cardiology if his EF remains stable and no arrhythmias are seen on the monitor he may benefit from outpatient Cardiac Monitoring. Cardiology also started Lisinopril 10 mg today. Patient was bolused 1500 mL and given IVF Rehydration at 100 mL/hr x 1 dose and we will repeat Orthostatics in AM. Cardiology will be adding 10 mg po Lisinopril.  We will continue to monitor patient's clinical response to interventions and repeat blood work and orthostatics in AM.

## 2017-04-12 ENCOUNTER — Observation Stay (HOSPITAL_BASED_OUTPATIENT_CLINIC_OR_DEPARTMENT_OTHER): Payer: Medicare Other

## 2017-04-12 ENCOUNTER — Observation Stay (HOSPITAL_COMMUNITY): Payer: Medicare Other

## 2017-04-12 DIAGNOSIS — S0181XA Laceration without foreign body of other part of head, initial encounter: Secondary | ICD-10-CM | POA: Diagnosis not present

## 2017-04-12 DIAGNOSIS — S0990XA Unspecified injury of head, initial encounter: Secondary | ICD-10-CM | POA: Diagnosis not present

## 2017-04-12 DIAGNOSIS — I5022 Chronic systolic (congestive) heart failure: Secondary | ICD-10-CM | POA: Diagnosis not present

## 2017-04-12 DIAGNOSIS — I951 Orthostatic hypotension: Secondary | ICD-10-CM | POA: Diagnosis not present

## 2017-04-12 DIAGNOSIS — J449 Chronic obstructive pulmonary disease, unspecified: Secondary | ICD-10-CM | POA: Diagnosis not present

## 2017-04-12 DIAGNOSIS — E43 Unspecified severe protein-calorie malnutrition: Secondary | ICD-10-CM | POA: Diagnosis not present

## 2017-04-12 DIAGNOSIS — R55 Syncope and collapse: Secondary | ICD-10-CM | POA: Diagnosis not present

## 2017-04-12 DIAGNOSIS — C049 Malignant neoplasm of floor of mouth, unspecified: Secondary | ICD-10-CM | POA: Diagnosis not present

## 2017-04-12 DIAGNOSIS — I428 Other cardiomyopathies: Secondary | ICD-10-CM | POA: Diagnosis not present

## 2017-04-12 DIAGNOSIS — D649 Anemia, unspecified: Secondary | ICD-10-CM | POA: Diagnosis not present

## 2017-04-12 DIAGNOSIS — E871 Hypo-osmolality and hyponatremia: Secondary | ICD-10-CM | POA: Diagnosis not present

## 2017-04-12 LAB — CBC WITH DIFFERENTIAL/PLATELET
BASOS ABS: 0 10*3/uL (ref 0.0–0.1)
Basophils Relative: 0 %
EOS ABS: 0.1 10*3/uL (ref 0.0–0.7)
EOS PCT: 1 %
HCT: 36 % — ABNORMAL LOW (ref 39.0–52.0)
HEMOGLOBIN: 11.7 g/dL — AB (ref 13.0–17.0)
LYMPHS PCT: 19 %
Lymphs Abs: 1.4 10*3/uL (ref 0.7–4.0)
MCH: 27.5 pg (ref 26.0–34.0)
MCHC: 32.5 g/dL (ref 30.0–36.0)
MCV: 84.7 fL (ref 78.0–100.0)
Monocytes Absolute: 0.8 10*3/uL (ref 0.1–1.0)
Monocytes Relative: 12 %
NEUTROS PCT: 68 %
Neutro Abs: 5 10*3/uL (ref 1.7–7.7)
PLATELETS: 192 10*3/uL (ref 150–400)
RBC: 4.25 MIL/uL (ref 4.22–5.81)
RDW: 15.7 % — ABNORMAL HIGH (ref 11.5–15.5)
WBC: 7.3 10*3/uL (ref 4.0–10.5)

## 2017-04-12 LAB — URINE CULTURE: Culture: NO GROWTH

## 2017-04-12 LAB — ECHOCARDIOGRAM COMPLETE
Height: 72 in
Weight: 2568 oz

## 2017-04-12 LAB — COMPREHENSIVE METABOLIC PANEL
ALT: 11 U/L — AB (ref 17–63)
AST: 14 U/L — AB (ref 15–41)
Albumin: 3.1 g/dL — ABNORMAL LOW (ref 3.5–5.0)
Alkaline Phosphatase: 78 U/L (ref 38–126)
Anion gap: 9 (ref 5–15)
BUN: 10 mg/dL (ref 6–20)
CHLORIDE: 109 mmol/L (ref 101–111)
CO2: 18 mmol/L — AB (ref 22–32)
CREATININE: 0.85 mg/dL (ref 0.61–1.24)
Calcium: 8.4 mg/dL — ABNORMAL LOW (ref 8.9–10.3)
GFR calc Af Amer: >60 mL/min (ref >60)
GFR calc non Af Amer: >60 mL/min (ref >60)
GLUCOSE: 97 mg/dL (ref 65–99)
Potassium: 4 mmol/L (ref 3.5–5.1)
SODIUM: 136 mmol/L (ref 135–145)
Total Bilirubin: 0.6 mg/dL (ref 0.3–1.2)
Total Protein: 6.9 g/dL (ref 6.5–8.1)

## 2017-04-12 LAB — MAGNESIUM: Magnesium: 1.6 mg/dL — ABNORMAL LOW (ref 1.7–2.4)

## 2017-04-12 LAB — PHOSPHORUS: Phosphorus: 3 mg/dL (ref 2.5–4.6)

## 2017-04-12 MED ORDER — SODIUM CHLORIDE 0.9 % IV BOLUS (SEPSIS): 1000.0000 mL | Freq: Once | INTRAVENOUS | Status: DC

## 2017-04-12 MED ORDER — MAGNESIUM SULFATE 2 GM/50ML IV SOLN
2.0000 g | Freq: Once | INTRAVENOUS | Status: AC
Start: 2017-04-12 — End: 2017-04-12
  Administered 2017-04-12: 2 g via INTRAVENOUS
  Filled 2017-04-12: qty 50

## 2017-04-12 MED ORDER — FUROSEMIDE 20 MG PO TABS: 20.0000 mg | ORAL_TABLET | ORAL | 0 refills | Status: DC

## 2017-04-12 NOTE — Care Management Note (Signed)
Case Management Note  Patient Details  Name: Julian Zimmerman MRN: [id number] Date of Birth: 06-Nov-1945  Subjective/Objective:    Syncope               Action/Plan: Patient lives at home with spouse; PCP - [place] ( he does not remember his name); has private insurance with Medicare / BCBS with prescription drug coverage; pharmacy of choice is [facility]; Patient could benefit form [place] services, choice offered, pt chose [facility]); Mary with [facility] called for arrangements; Attending MD at discharge please enter the [facility] orders along with the face to face per Medicare guidelines for [facility] services.  Expected Discharge Date:  04/12/17               Expected Discharge Plan:  [place]  Discharge planning Services  CM Consult  Choice offered to:  Patient  HH Arranged:  RN, PT [facility] Agency:  [facility] ([facility])  Status of Service:  In process, will continue to follow  Sherrilyn Rist [phone] 04/12/2017, 12:00 PM

## 2017-04-12 NOTE — Evaluation (Signed)
Physical Therapy Evaluation Patient Details Name: Julian Zimmerman MRN: [id number] DOB: 1946-09-10 Today's Date: 04/12/2017   History of Present Illness  24 male admitted s/p fall with orthostatic BP. PMH of Sysotlic CHF/NICM with an EF of 20% that is improved to 50-55% on ECHO, COPD, HTN, Oral squamous cell carcinoma, Tobacco Abuse, Gout,  Clinical Impression  Pt admitted with above diagnosis. Pt currently with functional limitations due to the deficits listed below (see PT Problem List). Pt independent with bed mobility, min guard for safety with transfers, and ambulation of 100 feet with no AD, minA for ascent/descent of 5 steps with rail assist.  Pt will benefit from skilled PT to increase their independence and safety with mobility to allow discharge to the venue listed below.       Follow Up Recommendations No PT follow up;Supervision for mobility/OOB    Equipment Recommendations  None recommended by PT    Recommendations for Other Services       Precautions / Restrictions Precautions Precautions: Fall Precaution Comments: orthostatic Restrictions Weight Bearing Restrictions: No      Mobility  Bed Mobility Overal bed mobility: Independent                Transfers Overall transfer level: Needs assistance Equipment used: None Transfers: Sit to/from Stand Sit to Stand: Min guard         General transfer comment: min guard for safety, good powerup from armrests.   Ambulation/Gait Ambulation/Gait assistance: Min guard Ambulation Distance (Feet): 100 Feet Assistive device: None Gait Pattern/deviations: Wide base of support;Ataxic;Decreased stride length;Step-through pattern Gait velocity: slowed Gait velocity interpretation: Below normal speed for age/gender General Gait Details: min guard for safety, ambulates with bilateral arms extended, stating that he has done that since his stroke, pt prefers not to use RW since he has been walking without it for some  time, vc for wider BoS  Stairs Stairs: Yes Stairs assistance: Min assist Stair Management: One rail Right;Alternating pattern;Forwards Number of Stairs: 5 General stair comments: min guard for safety, ataxic but safe, steady ascent, descent of stairs  Wheelchair Mobility    Modified Rankin (Stroke Patients Only)       Balance Overall balance assessment: Needs assistance Sitting-balance support: No upper extremity supported;Feet supported Sitting balance-Leahy Scale: Good     Standing balance support: Single extremity supported;During functional activity Standing balance-Leahy Scale: Fair Standing balance comment: able to static stand within his BoS with no LoB                             Pertinent Vitals/Pain Pain Assessment: No/denies pain    Home Living Family/patient expects to be discharged to:: Private residence Living Arrangements: Spouse/significant other Available Help at Discharge: Family;Available 24 hours/day Type of Home: House Home Access: Level entry     Home Layout: Multi-level;Bed/bath upstairs Home Equipment: Walker - 2 wheels;Cane - single point Additional Comments: bath and half on den level ( 1 step down from the front door) and bed room off of the den    Prior Function Level of Independence: Needs assistance      ADL's / Homemaking Assistance Needed: wife helps with bathing ( washing back)  Comments: pt currently drives     Hand Dominance   Dominant Hand: Right    Extremity/Trunk Assessment   Upper Extremity Assessment Upper Extremity Assessment: Defer to OT evaluation RUE Deficits / Details: ataxic hand movements LUE Deficits / Details: ataxic hand  movement- unable to flex digits 3 4 5      Lower Extremity Assessment Lower Extremity Assessment: RLE deficits/detail;LLE deficits/detail RLE Deficits / Details: ataxic leg movement LLE Deficits / Details: ataxic leg movement    Cervical / Trunk Assessment Cervical /  Trunk Assessment: Normal  Communication   Communication: No difficulties  Cognition Arousal/Alertness: Awake/alert Behavior During Therapy: WFL for tasks assessed/performed Overall Cognitive Status: Within Functional Limits for tasks assessed                                        General Comments General comments (skin integrity, edema, etc.): seated in chair, BP 139/68 , HR 71, in standing with activity BP 145/72, HR 72         Assessment/Plan    PT Assessment Patient needs continued PT services  PT Problem List Cardiopulmonary status limiting activity;Decreased balance       PT Treatment Interventions Gait training;Stair training;Functional mobility training;Therapeutic activities;Therapeutic exercise;Balance training;Patient/family education    PT Goals (Current goals can be found in the Care Plan section)  Acute Rehab PT Goals Patient Stated Goal: to return home to garden PT Goal Formulation: With patient/family Time For Goal Achievement: 04/19/17 Potential to Achieve Goals: Good    Frequency Min 3X/week   Barriers to discharge           AM-PAC PT "6 Clicks" Daily Activity  Outcome Measure Difficulty turning over in bed (including adjusting bedclothes, sheets and blankets)?: None Difficulty moving from lying on back to sitting on the side of the bed? : None Difficulty sitting down on and standing up from a chair with arms (e.g., wheelchair, bedside commode, etc,.)?: None Help needed moving to and from a bed to chair (including a wheelchair)?: None Help needed walking in hospital room?: None Help needed climbing 3-5 steps with a railing? : A Little 6 Click Score: 23    End of Session Equipment Utilized During Treatment: Gait belt Activity Tolerance: Patient tolerated treatment well Patient left: in bed;with call bell/phone within reach;with bed alarm set Nurse Communication: Mobility status PT Visit Diagnosis: Unsteadiness on feet  (R26.81);Other abnormalities of gait and mobility (R26.89);Muscle weakness (generalized) (M62.81);Difficulty in walking, not elsewhere classified (R26.2);Ataxic gait (R26.0);History of falling (Z91.81);Other (comment)    Time: 3532-9924 PT Time Calculation (min) (ACUTE ONLY): 18 min   Charges:   PT Evaluation $PT Eval Moderate Complexity: 1 Procedure     PT G Codes:   PT G-Codes **NOT FOR INPATIENT CLASS** Functional Assessment Tool Used: AM-PAC 6 Clicks Basic Mobility Functional Limitation: Mobility: Walking and moving around Mobility: Walking and Moving Around Current Status (Q6834): At least 1 percent but less than 20 percent impaired, limited or restricted Mobility: Walking and Moving Around Goal Status [phone]): 0 percent impaired, limited or restricted    Benjamine Mola B. Migdalia Dk PT, DPT Acute Rehabilitation  [phone] Pager [phone]    [place] 04/12/2017, 11:43 AM

## 2017-04-12 NOTE — Progress Notes (Signed)
MD paged to verify bolus order.  Pt's bp 145/72.  Order to recheck orthostatics.  Will carry out MD orders and continue to monitor.

## 2017-04-12 NOTE — Progress Notes (Signed)
2D Echocardiogram has been performed.  Julian Zimmerman 04/12/2017, 8:53 AM

## 2017-04-12 NOTE — Progress Notes (Signed)
Progress Note  Patient Name: Julian Zimmerman Date of Encounter: 04/12/2017  Primary Cardiologist: Nahser  Subjective   No angina, palpitations, near-syncope or dyspnea.  Inpatient Medications    Scheduled Meds: . aspirin EC  81 mg Oral Daily  . DULoxetine  60 mg Oral Daily  . enoxaparin (LOVENOX) injection  40 mg Subcutaneous Daily  . lisinopril  10 mg Oral Daily  . metoprolol succinate  50 mg Oral Daily  . sodium chloride flush  3 mL Intravenous Q12H  . tiotropium  18 mcg Inhalation Daily   Continuous Infusions:  PRN Meds: acetaminophen **OR** acetaminophen   Vital Signs    Vitals:   04/11/17 2031 04/12/17 0034 04/12/17 0443 04/12/17 0852  BP: 130/65 (!) 165/69 (!) 143/73 133/72  Pulse: 70 71 73 74  Resp: 18 20 18    Temp: 98.2 F (36.8 C) 98.2 F (36.8 C) 98.4 F (36.9 C)   TempSrc: Oral Oral Oral   SpO2: 98% 99% 100%   Weight:   160 lb 8 oz (72.8 kg)   Height:        Intake/Output Summary (Last 24 hours) at 04/12/17 1032 Last data filed at 04/12/17 0900  Gross per 24 hour  Intake              600 ml  Output              850 ml  Net             -250 ml   Filed Weights   04/11/17 0845 04/12/17 0443  Weight: 161 lb 9.6 oz (73.3 kg) 160 lb 8 oz (72.8 kg)    Telemetry    NSR - Personally Reviewed  ECG    NSR - Personally Reviewed  Physical Exam  Comfortable, smiling GEN: No acute distress.   Neck: No JVD Cardiac: RRR, no murmurs, rubs, or gallops.  Respiratory: Clear to auscultation bilaterally. GI: Soft, nontender, non-distended  MS: No edema; No deformity. Neuro:  Nonfocal  Psych: Normal affect   Labs    Chemistry Recent Labs Lab 04/10/17 2355 04/11/17 0515 04/12/17 0601  NA 134* 135 136  K 4.1 4.2 4.0  CL 104 108 109  CO2 20* 19* 18*  GLUCOSE 122* 123* 97  BUN 16 17 10   CREATININE 0.98 0.93 0.85  CALCIUM 8.7* 8.0* 8.4*  PROT  --   --  6.9  ALBUMIN  --   --  3.1*  AST  --   --  14*  ALT  --   --  11*  ALKPHOS  --   --   78  BILITOT  --   --  0.6  GFRNONAA >60 >60 >60  GFRAA >60 >60 >60  ANIONGAP 10 8 9      Hematology Recent Labs Lab 04/10/17 2355 04/11/17 0515 04/12/17 0601  WBC 14.1* 11.3* 7.3  RBC 4.26 3.83* 4.25  HGB 11.3* 10.1* 11.7*  HCT 35.5* 31.8* 36.0*  MCV 83.3 83.0 84.7  MCH 26.5 26.4 27.5  MCHC 31.8 31.8 32.5  RDW 15.2 15.3 15.7*  PLT 218 177 192    Cardiac Enzymes Recent Labs Lab 04/10/17 2355 04/11/17 0515  TROPONINI <0.03 <0.03   No results for input(s): TROPIPOC in the last 168 hours.   BNPNo results for input(s): BNP, PROBNP in the last 168 hours.   DDimer No results for input(s): DDIMER in the last 168 hours.   Radiology    Dg Chest 2 View  Result  Date: 04/11/2017 CLINICAL DATA:  Fall, laceration. EXAM: CHEST  2 VIEW COMPARISON:  Radiographs and CT 02/08/2017 FINDINGS: Again seen hyperinflation and emphysema. Unchanged heart size and mediastinal contours with atherosclerosis of the thoracic aorta. No pulmonary edema, consolidation, pleural fluid or pneumothorax. No acute osseous abnormalities. Site of laceration is not seen radiographically. IMPRESSION: 1. No acute abnormality. 2. Emphysema and thoracic aortic atherosclerosis. Electronically Signed   By: Jeb Levering M.D.   On: 04/11/2017 01:07   Ct Head Wo Contrast  Result Date: 04/10/2017 CLINICAL DATA:  Status post fall, hitting left side of head. Concern for head or cervical spine injury. Initial encounter. EXAM: CT HEAD WITHOUT CONTRAST CT CERVICAL SPINE WITHOUT CONTRAST TECHNIQUE: Multidetector CT imaging of the head and cervical spine was performed following the standard protocol without intravenous contrast. Multiplanar CT image reconstructions of the cervical spine were also generated. COMPARISON:  MRI of the brain from 11/29/2007, and CT of the neck performed 03/14/2014 FINDINGS: CT HEAD FINDINGS Brain: No evidence of acute infarction, hemorrhage, hydrocephalus, extra-axial collection or mass lesion/mass  effect. Prominence of the ventricles and sulci reflects mild cortical volume loss. Mild cerebellar atrophy is noted. Scattered periventricular white matter change likely reflects small vessel ischemic microangiopathy. The brainstem and fourth ventricle are within normal limits. The basal ganglia are unremarkable in appearance. The cerebral hemispheres demonstrate grossly normal gray-white differentiation. No mass effect or midline shift is seen. Vascular: No hyperdense vessel or unexpected calcification. Skull: There is no evidence of fracture; visualized osseous structures are unremarkable in appearance. Sinuses/Orbits: The orbits are within normal limits. There is near complete opacification of the mastoid air cells bilaterally. The paranasal sinuses are well-aerated. Other: No significant soft tissue abnormalities are seen. CT CERVICAL SPINE FINDINGS Alignment: Normal. Skull base and vertebrae: No acute fracture. No primary bone lesion or focal pathologic process. Soft tissues and spinal canal: No prevertebral fluid or swelling. No visible canal hematoma. Disc levels: Mild disc space narrowing is noted along the lower cervical spine, with prominent anterior osteophytes. Mild degenerative change is noted about the dens. Facet disease is noted along the mid cervical spine. Upper chest: Emphysema is noted at the lung apices. The thyroid gland is unremarkable. Calcification is noted at the carotid bifurcations bilaterally. Other: No additional soft tissue abnormalities are seen. IMPRESSION: 1. No evidence of traumatic intracranial injury or fracture. 2. No evidence of acute fracture or subluxation along the cervical spine. 3. Mild cortical volume loss and scattered small vessel ischemic microangiopathy. 4. Mild degenerative change noted along the cervical spine. 5. Emphysema at the lung apices. 6. Calcification at the carotid bifurcations bilaterally. Carotid ultrasound would be helpful for further evaluation, when  and as deemed clinically appropriate. 7. Near complete opacification of the mastoid air cells bilaterally. Electronically Signed   By: Garald Balding M.D.   On: 04/10/2017 23:57   Ct Cervical Spine Wo Contrast  Result Date: 04/10/2017 CLINICAL DATA:  Status post fall, hitting left side of head. Concern for head or cervical spine injury. Initial encounter. EXAM: CT HEAD WITHOUT CONTRAST CT CERVICAL SPINE WITHOUT CONTRAST TECHNIQUE: Multidetector CT imaging of the head and cervical spine was performed following the standard protocol without intravenous contrast. Multiplanar CT image reconstructions of the cervical spine were also generated. COMPARISON:  MRI of the brain from 11/29/2007, and CT of the neck performed 03/14/2014 FINDINGS: CT HEAD FINDINGS Brain: No evidence of acute infarction, hemorrhage, hydrocephalus, extra-axial collection or mass lesion/mass effect. Prominence of the ventricles and sulci reflects mild  cortical volume loss. Mild cerebellar atrophy is noted. Scattered periventricular white matter change likely reflects small vessel ischemic microangiopathy. The brainstem and fourth ventricle are within normal limits. The basal ganglia are unremarkable in appearance. The cerebral hemispheres demonstrate grossly normal gray-white differentiation. No mass effect or midline shift is seen. Vascular: No hyperdense vessel or unexpected calcification. Skull: There is no evidence of fracture; visualized osseous structures are unremarkable in appearance. Sinuses/Orbits: The orbits are within normal limits. There is near complete opacification of the mastoid air cells bilaterally. The paranasal sinuses are well-aerated. Other: No significant soft tissue abnormalities are seen. CT CERVICAL SPINE FINDINGS Alignment: Normal. Skull base and vertebrae: No acute fracture. No primary bone lesion or focal pathologic process. Soft tissues and spinal canal: No prevertebral fluid or swelling. No visible canal hematoma.  Disc levels: Mild disc space narrowing is noted along the lower cervical spine, with prominent anterior osteophytes. Mild degenerative change is noted about the dens. Facet disease is noted along the mid cervical spine. Upper chest: Emphysema is noted at the lung apices. The thyroid gland is unremarkable. Calcification is noted at the carotid bifurcations bilaterally. Other: No additional soft tissue abnormalities are seen. IMPRESSION: 1. No evidence of traumatic intracranial injury or fracture. 2. No evidence of acute fracture or subluxation along the cervical spine. 3. Mild cortical volume loss and scattered small vessel ischemic microangiopathy. 4. Mild degenerative change noted along the cervical spine. 5. Emphysema at the lung apices. 6. Calcification at the carotid bifurcations bilaterally. Carotid ultrasound would be helpful for further evaluation, when and as deemed clinically appropriate. 7. Near complete opacification of the mastoid air cells bilaterally. Electronically Signed   By: Garald Balding M.D.   On: 04/10/2017 23:57    Cardiac Studies   04/11/2017 ECHO - Left ventricle: The cavity size was normal. Systolic function was   at the lower limits of normal. The estimated ejection fraction   was in the range of 50% to 55%. Wall motion was normal; there   were no regional wall motion abnormalities. Left ventricular   diastolic function parameters were normal.   Patient Profile     71 y.o. male with history of cardiomyopathy with improved LVEF presenting with syncope, probably due to orthostatic hypotension.   Assessment & Plan    No further inpatient cardiac evaluation is planned. Still has some orthostatic hypotension. I would not prescribe ACEinh as outpt since LVEF is essentially normal. Risk of hypotension outweighs the benefit. See notes from 08/01/2016 visit with Lafonda Mosses, ANP ("Attempted lisinopril restart with resulting hypotension at least twice"). Take furosemide  every other day rather than daily. 30-day event monitor is appropriate.  Signed, Sanda Klein, MD  04/12/2017, 10:32 AM

## 2017-04-12 NOTE — Evaluation (Signed)
Occupational Therapy Evaluation Patient Details Name: Julian Zimmerman MRN: [id number] DOB: 1946-01-21 Today's Date: 04/12/2017    History of Present Illness 44 male admitted s/p fall with orthostatic BP. 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 Pt currently with functional limitiations due to the deficits listed below (see OT problem list). PTA was requiring (A) for back during bathing otherwise MOD I.  Pt will benefit from skilled OT to increase their independence and safety with adls and balance to allow discharge HHOT for balance with adls. Pt has no symptoms and no awareness to fall risk .     Follow Up Recommendations  Home health OT    Equipment Recommendations  None recommended by OT    Recommendations for Other Services       Precautions / Restrictions Precautions Precautions: Fall Precaution Comments: orthostatic      Mobility Bed Mobility Overal bed mobility: Independent                Transfers Overall transfer level: Needs assistance   Transfers: Sit to/from Stand Sit to Stand: Min assist         General transfer comment: pt walks with bil hand extended adn reports this is his normal    Balance Overall balance assessment: Needs assistance         Standing balance support: Single extremity supported;During functional activity Standing balance-Leahy Scale: Poor                             ADL either performed or assessed with clinical judgement   ADL Overall ADL's : Needs assistance/impaired Eating/Feeding: Set up;Sitting   Grooming: Wash/dry hands;Set up;Sitting Grooming Details (indicate cue type and reason): decr ability to hold wash cloth Upper Body Bathing: Minimal assistance   Lower Body Bathing: Minimal assistance           Toilet Transfer: Minimal assistance Toilet  Transfer Details (indicate cue type and reason): dropping urinal and spillage on the floor. unable to count quality more than 200 cc by the volume cleaned         Functional mobility during ADLs: Minimal assistance General ADL Comments: Pt very motivated to return home     Vision Baseline Vision/History: Wears glasses Wears Glasses: At all times       Perception     Praxis      Pertinent Vitals/Pain Pain Assessment: No/denies pain     Hand Dominance Right   Extremity/Trunk Assessment Upper Extremity Assessment Upper Extremity Assessment: RUE deficits/detail;LUE deficits/detail RUE Deficits / Details: ataxic hand movements LUE Deficits / Details: ataxic hand movement- unable to flex digits 3 4 5     Lower Extremity Assessment Lower Extremity Assessment: Defer to PT evaluation   Cervical / Trunk Assessment Cervical / Trunk Assessment: Normal   Communication Communication Communication: No difficulties   Cognition Arousal/Alertness: Awake/alert Behavior During Therapy: WFL for tasks assessed/performed Overall Cognitive Status: Within Functional Limits for tasks assessed                                     General Comments  wound on forehead    Exercises     Shoulder Instructions      Home Living Family/patient  expects to be discharged to:: Private residence Living Arrangements: Spouse/significant other Available Help at Discharge: Family;Available 24 hours/day Type of Home: House Home Access: Level entry     Home Layout: Multi-level;Bed/bath upstairs     Bathroom Shower/Tub: Tub/shower unit   Bathroom Toilet: Handicapped height     Home Equipment: Environmental consultant - 2 wheels;Cane - single point   Additional Comments: bath and half on den level ( 1 step down from the front door) and bed room off of the den      Prior Functioning/Environment Level of Independence: Needs assistance    ADL's / Homemaking Assistance Needed: wife helps with  bathing ( washing back)   Comments: pt currently drives        OT Problem List: Decreased strength;Decreased activity tolerance;Impaired balance (sitting and/or standing);Decreased safety awareness;Decreased knowledge of use of DME or AE;Decreased knowledge of precautions      OT Treatment/Interventions: Self-care/ADL training;Therapeutic exercise;DME and/or AE instruction;Therapeutic activities;Balance training;Patient/family education    OT Goals(Current goals can be found in the care plan section) Acute Rehab OT Goals Patient Stated Goal: to return home to garden OT Goal Formulation: With patient Time For Goal Achievement: 04/26/17 Potential to Achieve Goals: Good  OT Frequency: Min 2X/week   Barriers to D/C:            Co-evaluation              AM-PAC PT "6 Clicks" Daily Activity     Outcome Measure Help from another person eating meals?: None Help from another person taking care of personal grooming?: A Little Help from another person toileting, which includes using toliet, bedpan, or urinal?: A Little Help from another person bathing (including washing, rinsing, drying)?: A Little Help from another person to put on and taking off regular upper body clothing?: A Little Help from another person to put on and taking off regular lower body clothing?: A Lot 6 Click Score: 18   End of Session Nurse Communication: Mobility status;Precautions  Activity Tolerance: Patient tolerated treatment well Patient left: in chair;with call bell/phone within reach;with chair alarm set  OT Visit Diagnosis: Unsteadiness on feet (R26.81)                Time: 8675-4492 OT Time Calculation (min): 29 min Charges:  OT General Charges $OT Visit: 1 Procedure OT Evaluation $OT Eval Moderate Complexity: 1 Procedure G-Codes: OT G-codes **NOT FOR INPATIENT CLASS** Functional Assessment Tool Used: Clinical judgement Functional Limitation: Self care Self Care Current Status (E1007): At  least 40 percent but less than 60 percent impaired, limited or restricted Self Care Goal Status (H2197): At least 40 percent but less than 60 percent impaired, limited or restricted    Jeri Modena   OTR/L Pager: [phone] Office: [phone] .  Parke Poisson B 04/12/2017, 10:29 AM

## 2017-04-12 NOTE — Discharge Summary (Signed)
Physician Discharge Summary  Julian Zimmerman WUJ:[id number] DOB: 1946/02/15 DOA: 04/10/2017  PCP: Patient, No Pcp Per  Admit date: 04/10/2017 Discharge date: 04/12/2017  Admitted From: Home Disposition:  Home with [facility] PT/OT/RN  Recommendations for Outpatient Follow-up:  1. Follow up with PCP in 1-2 weeks 2. Follow up with [facility] Cardiology at D/C and have them set up a 30-day Event Monitor 3. Follow up with Carotid Ultrasound as an outpatient for Calcification of the Carotid Bifurcations bilaterally 4. Please obtain CMP/CBC, Mag, Phos in one week 5. Follow up on Blood Cx results.   Home Health: YES Equipment/Devices: None    Discharge Condition: Stable CODE STATUS: DO NOT RESUSCITATE Diet recommendation: Heart Healthy Diet  Brief/Interim Summary: The patient is a 71 yo AAM with a PMH of Sysotlic CHF/NICM with an EF of 20% that is improved to 50-55% on ECHO 04/12/17, COPD, HTN, Oral squamous cell carcinoma, Tobacco Abuse, Gout, and other comorbids who presented to [facility] with Syncope. Cardiology was consulted because of his Hx of NICM with an EF of 20% and an ECHOCardiogram was ordered and showed improvement. Patient's Cardiac Troponin's have been <0.03 x2 so far. Per Cardiology if his EF remains stable and no arrhythmias are seen on the monitor he may benefit from outpatient Cardiac Monitoring. Cardiology also started Lisinopril 10 mg yesterday but discontinued because risk of hypotension. Patient was bolused 1500 mL and given IVF Rehydration at 100 mL/hr x 1 dose. ECHOCardiogram was done and was normal. Patient was orthostatic this AM so IVF was continued and rechecked later and he was not orthostatic. Patient likely had Syncope from Orthostasis so Cardiology stopped Lisinopril. PT/OT evaluated and OT recommended Home Health OT. Patient was deemed medically stable and will need to follow up with PCP and Cardiology as an outpatient.   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 Syncope Likely Orthostatic -Head CT and Cervical Spine CT showed No evidence of traumatic intracranial injury or fracture.No evidence of acute fracture or subluxation along the cervical spine.Mild cortical volume loss and scattered small vessel ischemic microangiopathy. Mild degenerative change noted along the cervical spine. -Differential included VT in setting of NICM/reduced LVF vs more benign cause (sildenafil causing orthostasis, alcohol causing orthostasis, or dehydration/vagal episode from not eating all day).    -Was given Gentle IV fluids -Monitored for ventricular tachycardias on tele -Obtained Echocardiogram and showed the estimated ejection fraction was in the range of 50% to 55%. Wall motion was normal; there were no regional wall motion abnormalities. Left ventricular diastolic function parameters were normal -Trended troponin and was <0.03 x2 -Consulted Cardiology who recommended 30 day Event Monitor -Cardiology recommended not to prescribe ACEinh as outpt since LVEF is essentially normal. Risk of hypotension outweighs the benefit -Hold sildenafil at D/C and follow up with PCP  -Orthostatics done this AM and showed Pt was orthostatic; repeat Orthostatics showed patient improved and wasn't orthostatic -PT/OT evaluated and OT recommended Home OT  2. COPD:  -CT Scan showed Emphysema at the lung apices. -Continue Spiriva  3. Hyponatremia:  -Mild and improved from 134 -> 135 -> 136 -Repeat CMP as an outpatient  4. Anemia:  -Mild -Hb/Hct went from 11.3/35.5 -> 10.1/31.8 ->  11.7 -Repeat CBC as an Outpatient   5. Chronic systolic CHF from an ICM:  -EF was 20% and improved to  50-55% -Monitor ins and outs -Per Cardiology c/w Furosemide 20 mg po EOD -Continue metoprolol -Continue aspirin -ECHO as below -NO ACE/Arb because risk of hypotension  6. Leukocytosis: No focal findings to suggest infection.  BP stable. -Obtained cultures and Showed NGTD at 1 day -Checked lactic acid and was 0.80  7. Carotid Artery Calcifications -Outpatient Carotid Ultrasound   8. Hypomagnesemia -Patient's Mag level was 1.6 -Replete with IV Mag Sulfate -Repeat Mag level as an outpatient   Discharge Instructions  Discharge Instructions    Call MD for:  difficulty breathing, headache or visual disturbances    Complete by:  As directed    Call MD for:  extreme fatigue    Complete by:  As directed    Call MD for:  hives    Complete by:  As directed    Call MD for:  persistant dizziness or light-headedness    Complete by:  As directed    Call MD for:  persistant nausea and vomiting    Complete by:  As directed    Call MD for:  redness, tenderness, or signs of infection (pain, swelling, redness, odor or green/yellow discharge around incision site)    Complete by:  As directed    Call MD for:  severe uncontrolled pain    Complete by:  As directed    Call MD for:  temperature >100.4    Complete by:  As directed    Diet - low sodium heart healthy    Complete by:  As directed    Discharge instructions    Complete by:  As directed    Follow up with PCP and take all medications as prescribed. Follow up with Cardiology as an outpatient for 30 day Event monitor. If symptoms worsen or change please return to the ED for evaluation.   Increase activity slowly    Complete by:  As directed      Allergies as of 04/12/2017   No Known Allergies     Medication List    STOP taking these medications   sildenafil 20 MG tablet Commonly known as:  REVATIO     TAKE these medications   ammonium lactate 12 % lotion Commonly known as:  LAC-HYDRIN Apply 1 application topically 2 (two)  times daily as needed for dry skin.   aspirin 81 MG EC tablet Take 1 tablet (81 mg total) by mouth daily.   bisacodyl 5 MG EC tablet Commonly known as:  DULCOLAX Take 5 mg by mouth daily as needed for mild constipation.   DULoxetine 30 MG capsule Commonly known as:  CYMBALTA Take 2 capsules everyday in the morning.   furosemide 20 MG tablet Commonly known as:  LASIX Take 1 tablet (20 mg total) by mouth every other day. What changed:  when to take this  reasons to take this   metoprolol succinate 50 MG 24 hr tablet Commonly known as:  TOPROL-XL Take 50 mg by mouth daily.   polyethylene glycol packet Commonly known as:  MIRALAX / GLYCOLAX Take 17 g by mouth daily as needed for mild constipation.   tiotropium 18 MCG inhalation capsule Commonly known as:  SPIRIVA HANDIHALER Place 1 capsule (18 mcg total) into inhaler and inhale daily.   vitamin B-12 1000 MCG tablet Commonly known as:  CYANOCOBALAMIN Take 1,000 mcg by mouth daily.  Follow-up Information    [facility] Follow up.   Specialty:  [place] Why:  They will do your home health care at your home Contact information: [address] [place] [address] [phone]        Bing Neighbors, NP. Call in 1 week(s).   Specialty:  Cardiology Why:  Call to schedule a follow up appointment for a 30 day Event Monitor Contact information: [address] [place] [address] [phone]          No Known Allergies  Consultations:  Cardiology  Procedures/Studies: Dg Chest 2 View  Result Date: 04/11/2017 CLINICAL DATA:  Fall, laceration. EXAM: CHEST  2 VIEW COMPARISON:  Radiographs and CT 02/08/2017 FINDINGS: Again seen hyperinflation and emphysema. Unchanged heart size and mediastinal contours with atherosclerosis of the thoracic aorta. No pulmonary edema, consolidation, pleural fluid or pneumothorax. No acute osseous abnormalities.  Site of laceration is not seen radiographically. IMPRESSION: 1. No acute abnormality. 2. Emphysema and thoracic aortic atherosclerosis. Electronically Signed   By: Jeb Levering M.D.   On: 04/11/2017 01:07   Ct Head Wo Contrast  Result Date: 04/10/2017 CLINICAL DATA:  Status post fall, hitting left side of head. Concern for head or cervical spine injury. Initial encounter. EXAM: CT HEAD WITHOUT CONTRAST CT CERVICAL SPINE WITHOUT CONTRAST TECHNIQUE: Multidetector CT imaging of the head and cervical spine was performed following the standard protocol without intravenous contrast. Multiplanar CT image reconstructions of the cervical spine were also generated. COMPARISON:  MRI of the brain from 11/29/2007, and CT of the neck performed 03/14/2014 FINDINGS: CT HEAD FINDINGS Brain: No evidence of acute infarction, hemorrhage, hydrocephalus, extra-axial collection or mass lesion/mass effect. Prominence of the ventricles and sulci reflects mild cortical volume loss. Mild cerebellar atrophy is noted. Scattered periventricular white matter change likely reflects small vessel ischemic microangiopathy. The brainstem and fourth ventricle are within normal limits. The basal ganglia are unremarkable in appearance. The cerebral hemispheres demonstrate grossly normal gray-white differentiation. No mass effect or midline shift is seen. Vascular: No hyperdense vessel or unexpected calcification. Skull: There is no evidence of fracture; visualized osseous structures are unremarkable in appearance. Sinuses/Orbits: The orbits are within normal limits. There is near complete opacification of the mastoid air cells bilaterally. The paranasal sinuses are well-aerated. Other: No significant soft tissue abnormalities are seen. CT CERVICAL SPINE FINDINGS Alignment: Normal. Skull base and vertebrae: No acute fracture. No primary bone lesion or focal pathologic process. Soft tissues and spinal canal: No prevertebral fluid or swelling. No  visible canal hematoma. Disc levels: Mild disc space narrowing is noted along the lower cervical spine, with prominent anterior osteophytes. Mild degenerative change is noted about the dens. Facet disease is noted along the mid cervical spine. Upper chest: Emphysema is noted at the lung apices. The thyroid gland is unremarkable. Calcification is noted at the carotid bifurcations bilaterally. Other: No additional soft tissue abnormalities are seen. IMPRESSION: 1. No evidence of traumatic intracranial injury or fracture. 2. No evidence of acute fracture or subluxation along the cervical spine. 3. Mild cortical volume loss and scattered small vessel ischemic microangiopathy. 4. Mild degenerative change noted along the cervical spine. 5. Emphysema at the lung apices. 6. Calcification at the carotid bifurcations bilaterally. Carotid ultrasound would be helpful for further evaluation, when and as deemed clinically appropriate. 7. Near complete opacification of the mastoid air cells bilaterally. Electronically Signed   By: Francoise Schaumann.D.  On: 04/10/2017 23:57   Ct Cervical Spine Wo Contrast  Result Date: 04/10/2017 CLINICAL DATA:  Status post fall, hitting left side of head. Concern for head or cervical spine injury. Initial encounter. EXAM: CT HEAD WITHOUT CONTRAST CT CERVICAL SPINE WITHOUT CONTRAST TECHNIQUE: Multidetector CT imaging of the head and cervical spine was performed following the standard protocol without intravenous contrast. Multiplanar CT image reconstructions of the cervical spine were also generated. COMPARISON:  MRI of the brain from 11/29/2007, and CT of the neck performed 03/14/2014 FINDINGS: CT HEAD FINDINGS Brain: No evidence of acute infarction, hemorrhage, hydrocephalus, extra-axial collection or mass lesion/mass effect. Prominence of the ventricles and sulci reflects mild cortical volume loss. Mild cerebellar atrophy is noted. Scattered periventricular white matter change likely reflects  small vessel ischemic microangiopathy. The brainstem and fourth ventricle are within normal limits. The basal ganglia are unremarkable in appearance. The cerebral hemispheres demonstrate grossly normal gray-white differentiation. No mass effect or midline shift is seen. Vascular: No hyperdense vessel or unexpected calcification. Skull: There is no evidence of fracture; visualized osseous structures are unremarkable in appearance. Sinuses/Orbits: The orbits are within normal limits. There is near complete opacification of the mastoid air cells bilaterally. The paranasal sinuses are well-aerated. Other: No significant soft tissue abnormalities are seen. CT CERVICAL SPINE FINDINGS Alignment: Normal. Skull base and vertebrae: No acute fracture. No primary bone lesion or focal pathologic process. Soft tissues and spinal canal: No prevertebral fluid or swelling. No visible canal hematoma. Disc levels: Mild disc space narrowing is noted along the lower cervical spine, with prominent anterior osteophytes. Mild degenerative change is noted about the dens. Facet disease is noted along the mid cervical spine. Upper chest: Emphysema is noted at the lung apices. The thyroid gland is unremarkable. Calcification is noted at the carotid bifurcations bilaterally. Other: No additional soft tissue abnormalities are seen. IMPRESSION: 1. No evidence of traumatic intracranial injury or fracture. 2. No evidence of acute fracture or subluxation along the cervical spine. 3. Mild cortical volume loss and scattered small vessel ischemic microangiopathy. 4. Mild degenerative change noted along the cervical spine. 5. Emphysema at the lung apices. 6. Calcification at the carotid bifurcations bilaterally. Carotid ultrasound would be helpful for further evaluation, when and as deemed clinically appropriate. 7. Near complete opacification of the mastoid air cells bilaterally. Electronically Signed   By: Garald Balding M.D.   On: 04/10/2017 23:57     ECHOCARDIOGRAM Study Conclusions  - Left ventricle: The cavity size was normal. Systolic function was   at the lower limits of normal. The estimated ejection fraction   was in the range of 50% to 55%. Wall motion was normal; there   were no regional wall motion abnormalities. Left ventricular   diastolic function parameters were normal.  Subjective: Seen and examined at bedside and was doing well. No recurrent syncope. No CP or SOB. Ready to go home.   Discharge Exam: Vitals:   04/12/17 1144 04/12/17 1158  BP: (!) 145/72   Pulse:    Resp:    Temp:  98 F (36.7 C)   Vitals:   04/12/17 0443 04/12/17 0852 04/12/17 1144 04/12/17 1158  BP: (!) 143/73 133/72 (!) 145/72   Pulse: 73 74    Resp: 18     Temp: 98.4 F (36.9 C)   98 F (36.7 C)  TempSrc: Oral   Oral  SpO2: 100%  98% 96%  Weight: 72.8 kg (160 lb 8 oz)     Height:  General: Pt is alert, awake, not in acute distress; Had a forehead laceration Cardiovascular: RRR, S1/S2 +, no rubs, no gallops Respiratory: CTA bilaterally, no wheezing, no rhonchi Abdominal: Soft, NT, ND, bowel sounds + Extremities: no edema, no cyanosis  The results of significant diagnostics from this hospitalization (including imaging, microbiology, ancillary and laboratory) are listed below for reference.    Microbiology: Recent Results (from the past 240 hour(s))  Urine culture     Status: None   Collection Time: 04/11/17 12:45 AM  Result Value Ref Range Status   Specimen Description URINE, RANDOM  Final   Special Requests NONE  Final   Culture NO GROWTH  Final   Report Status 04/12/2017 FINAL  Final  Blood Culture (routine x 2)     Status: None (Preliminary result)   Collection Time: 04/11/17  1:40 AM  Result Value Ref Range Status   Specimen Description BLOOD RIGHT HAND  Final   Special Requests   Final    BOTTLES DRAWN AEROBIC AND ANAEROBIC Blood Culture adequate volume   Culture NO GROWTH 1 DAY  Final   Report Status  PENDING  Incomplete  Blood Culture (routine x 2)     Status: None (Preliminary result)   Collection Time: 04/11/17  1:46 AM  Result Value Ref Range Status   Specimen Description BLOOD LEFT ARM  Final   Special Requests   Final    BOTTLES DRAWN AEROBIC AND ANAEROBIC Blood Culture adequate volume   Culture NO GROWTH 1 DAY  Final   Report Status PENDING  Incomplete    Labs: BNP (last 3 results) No results for input(s): BNP in the last 8760 hours. Basic Metabolic Panel:  Recent Labs Lab 04/10/17 2355 04/11/17 0515 04/12/17 0601  NA 134* 135 136  K 4.1 4.2 4.0  CL 104 108 109  CO2 20* 19* 18*  GLUCOSE 122* 123* 97  BUN 16 17 10   CREATININE 0.98 0.93 0.85  CALCIUM 8.7* 8.0* 8.4*  MG  --   --  1.6*  PHOS  --   --  3.0   Liver Function Tests:  Recent Labs Lab 04/12/17 0601  AST 14*  ALT 11*  ALKPHOS 78  BILITOT 0.6  PROT 6.9  ALBUMIN 3.1*   No results for input(s): LIPASE, AMYLASE in the last 168 hours. No results for input(s): AMMONIA in the last 168 hours. CBC:  Recent Labs Lab 04/10/17 2355 04/11/17 0515 04/12/17 0601  WBC 14.1* 11.3* 7.3  NEUTROABS 12.1*  --  5.0  HGB 11.3* 10.1* 11.7*  HCT 35.5* 31.8* 36.0*  MCV 83.3 83.0 84.7  PLT 218 177 192   Cardiac Enzymes:  Recent Labs Lab 04/10/17 2355 04/11/17 0515  TROPONINI <0.03 <0.03   BNP: Invalid input(s): POCBNP CBG: No results for input(s): GLUCAP in the last 168 hours. D-Dimer No results for input(s): DDIMER in the last 72 hours. Hgb A1c No results for input(s): HGBA1C in the last 72 hours. Lipid Profile No results for input(s): CHOL, HDL, LDLCALC, TRIG, CHOLHDL, LDLDIRECT in the last 72 hours. Thyroid function studies No results for input(s): TSH, T4TOTAL, T3FREE, THYROIDAB in the last 72 hours.  Invalid input(s): FREET3 Anemia work up No results for input(s): VITAMINB12, FOLATE, FERRITIN, TIBC, IRON, RETICCTPCT in the last 72 hours. Urinalysis    Component Value Date/Time    COLORURINE YELLOW 04/11/2017 0102   APPEARANCEUR CLEAR 04/11/2017 0102   LABSPEC 1.020 04/11/2017 0102   PHURINE 5.0 04/11/2017 0102   GLUCOSEU NEGATIVE 04/11/2017  0102   HGBUR NEGATIVE 04/11/2017 0102   BILIRUBINUR NEGATIVE 04/11/2017 0102   KETONESUR NEGATIVE 04/11/2017 0102   PROTEINUR 30 (A) 04/11/2017 0102   UROBILINOGEN 0.2 03/26/2014 1117   NITRITE NEGATIVE 04/11/2017 0102   LEUKOCYTESUR NEGATIVE 04/11/2017 0102   Sepsis Labs Invalid input(s): PROCALCITONIN,  WBC,  LACTICIDVEN Microbiology Recent Results (from the past 240 hour(s))  Urine culture     Status: None   Collection Time: 04/11/17 12:45 AM  Result Value Ref Range Status   Specimen Description URINE, RANDOM  Final   Special Requests NONE  Final   Culture NO GROWTH  Final   Report Status 04/12/2017 FINAL  Final  Blood Culture (routine x 2)     Status: None (Preliminary result)   Collection Time: 04/11/17  1:40 AM  Result Value Ref Range Status   Specimen Description BLOOD RIGHT HAND  Final   Special Requests   Final    BOTTLES DRAWN AEROBIC AND ANAEROBIC Blood Culture adequate volume   Culture NO GROWTH 1 DAY  Final   Report Status PENDING  Incomplete  Blood Culture (routine x 2)     Status: None (Preliminary result)   Collection Time: 04/11/17  1:46 AM  Result Value Ref Range Status   Specimen Description BLOOD LEFT ARM  Final   Special Requests   Final    BOTTLES DRAWN AEROBIC AND ANAEROBIC Blood Culture adequate volume   Culture NO GROWTH 1 DAY  Final   Report Status PENDING  Incomplete   Time coordinating discharge: 35 minutes  SIGNED:  Kerney Elbe, DO [facility] 04/12/2017, 1:00 PM Pager [phone]  If 7PM-7AM, please contact night-coverage [email or web address] Password [email or web address]

## 2017-04-12 NOTE — Progress Notes (Signed)
OT NOTE  Orthostatic BPs  Supine 137/59  Sitting 121/68     Standing 93/59      Pt demonstrates orthostatic BP without any symptoms.   Jeri Modena   OTR/L Pager: [phone] Office: [phone] .

## 2017-04-15 ENCOUNTER — Telehealth: Payer: Self-pay | Admitting: Cardiovascular Disease

## 2017-04-15 DIAGNOSIS — I110 Hypertensive heart disease with heart failure: Secondary | ICD-10-CM | POA: Diagnosis not present

## 2017-04-15 DIAGNOSIS — J439 Emphysema, unspecified: Secondary | ICD-10-CM | POA: Diagnosis not present

## 2017-04-15 DIAGNOSIS — D649 Anemia, unspecified: Secondary | ICD-10-CM | POA: Diagnosis not present

## 2017-04-15 DIAGNOSIS — I951 Orthostatic hypotension: Secondary | ICD-10-CM | POA: Diagnosis not present

## 2017-04-15 DIAGNOSIS — S0181XD Laceration without foreign body of other part of head, subsequent encounter: Secondary | ICD-10-CM | POA: Diagnosis not present

## 2017-04-15 DIAGNOSIS — I5022 Chronic systolic (congestive) heart failure: Secondary | ICD-10-CM | POA: Diagnosis not present

## 2017-04-15 NOTE — Telephone Encounter (Signed)
Julian Zimmerman from [facility] called to let Dr Acie Fredrickson that pt was D/C from the hospital with PT/OT orders. Julian Zimmerman was made aware that pt has been seen by [facility] cardiologist  at least since 2015.[name] Pt's  D/C orders states that pt will be F/U by a [facility] cardiologist after D/C . Case manager verbalized understanding.

## 2017-04-15 NOTE — Telephone Encounter (Signed)
New message    Gerilyn Nestle from [facility] is calling to let Dr. Acie Fredrickson know that they are going out today to start services that were ordered after hospital discharge. She said he has orders for PT/OT.

## 2017-04-16 DIAGNOSIS — I951 Orthostatic hypotension: Secondary | ICD-10-CM | POA: Diagnosis not present

## 2017-04-16 DIAGNOSIS — D649 Anemia, unspecified: Secondary | ICD-10-CM | POA: Diagnosis not present

## 2017-04-16 DIAGNOSIS — I110 Hypertensive heart disease with heart failure: Secondary | ICD-10-CM | POA: Diagnosis not present

## 2017-04-16 DIAGNOSIS — J439 Emphysema, unspecified: Secondary | ICD-10-CM | POA: Diagnosis not present

## 2017-04-16 DIAGNOSIS — R55 Syncope and collapse: Secondary | ICD-10-CM | POA: Diagnosis not present

## 2017-04-16 DIAGNOSIS — I5022 Chronic systolic (congestive) heart failure: Secondary | ICD-10-CM | POA: Diagnosis not present

## 2017-04-16 DIAGNOSIS — S0181XD Laceration without foreign body of other part of head, subsequent encounter: Secondary | ICD-10-CM | POA: Diagnosis not present

## 2017-04-16 LAB — CULTURE, BLOOD (ROUTINE X 2)
CULTURE: NO GROWTH
Culture: NO GROWTH
SPECIAL REQUESTS: ADEQUATE
Special Requests: ADEQUATE

## 2017-04-19 DIAGNOSIS — I5022 Chronic systolic (congestive) heart failure: Secondary | ICD-10-CM | POA: Diagnosis not present

## 2017-04-19 DIAGNOSIS — I110 Hypertensive heart disease with heart failure: Secondary | ICD-10-CM | POA: Diagnosis not present

## 2017-04-19 DIAGNOSIS — S0181XD Laceration without foreign body of other part of head, subsequent encounter: Secondary | ICD-10-CM | POA: Diagnosis not present

## 2017-04-19 DIAGNOSIS — J439 Emphysema, unspecified: Secondary | ICD-10-CM | POA: Diagnosis not present

## 2017-04-19 DIAGNOSIS — I951 Orthostatic hypotension: Secondary | ICD-10-CM | POA: Diagnosis not present

## 2017-04-19 DIAGNOSIS — D649 Anemia, unspecified: Secondary | ICD-10-CM | POA: Diagnosis not present

## 2017-04-20 ENCOUNTER — Encounter: Payer: Self-pay | Admitting: Pulmonary Disease

## 2017-04-20 ENCOUNTER — Ambulatory Visit (INDEPENDENT_AMBULATORY_CARE_PROVIDER_SITE_OTHER): Payer: Medicare Other | Admitting: Pulmonary Disease

## 2017-04-20 VITALS — BP 122/74 | HR 84 | Temp 98.2°F | Ht 72.0 in | Wt 158.1 lb

## 2017-04-20 DIAGNOSIS — I428 Other cardiomyopathies: Secondary | ICD-10-CM | POA: Diagnosis not present

## 2017-04-20 DIAGNOSIS — G620 Drug-induced polyneuropathy: Secondary | ICD-10-CM

## 2017-04-20 DIAGNOSIS — C049 Malignant neoplasm of floor of mouth, unspecified: Secondary | ICD-10-CM

## 2017-04-20 DIAGNOSIS — T451X5A Adverse effect of antineoplastic and immunosuppressive drugs, initial encounter: Secondary | ICD-10-CM

## 2017-04-20 DIAGNOSIS — J449 Chronic obstructive pulmonary disease, unspecified: Secondary | ICD-10-CM | POA: Diagnosis not present

## 2017-04-20 DIAGNOSIS — M8708 Idiopathic aseptic necrosis of bone, other site: Secondary | ICD-10-CM | POA: Diagnosis not present

## 2017-04-20 DIAGNOSIS — R269 Unspecified abnormalities of gait and mobility: Secondary | ICD-10-CM

## 2017-04-20 DIAGNOSIS — S0101XD Laceration without foreign body of scalp, subsequent encounter: Secondary | ICD-10-CM | POA: Diagnosis not present

## 2017-04-20 DIAGNOSIS — R55 Syncope and collapse: Secondary | ICD-10-CM | POA: Diagnosis not present

## 2017-04-20 DIAGNOSIS — I2510 Atherosclerotic heart disease of native coronary artery without angina pectoris: Secondary | ICD-10-CM

## 2017-04-20 DIAGNOSIS — M879 Osteonecrosis, unspecified: Secondary | ICD-10-CM

## 2017-04-20 DIAGNOSIS — F17210 Nicotine dependence, cigarettes, uncomplicated: Secondary | ICD-10-CM

## 2017-04-20 DIAGNOSIS — D72829 Elevated white blood cell count, unspecified: Secondary | ICD-10-CM | POA: Diagnosis not present

## 2017-04-20 NOTE — Progress Notes (Signed)
Subjective:     Patient ID: Julian Zimmerman, male   DOB: 1945-10-11, 71 y.o.   MRN: [id number]  HPI   ~  March 25, 2014:  In-patient pulmonary consultation by KC>   REFERRING PHYSICIAN:  Internal Medicine Teaching Service. HISTORY OF PRESENT ILLNESS:  The patient is a very pleasant 71 year old gentleman with very little prior medical care, who I have been asked to see for an abnormal CT chest.  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  He presented to the emergency room with progressive dyspnea on exertion, and a CT angio was done in order to rule out a pulmonary embolus.  This did not show a PET, but did show borderline lymphadenopathy, a small ground-glass opacity in the left lower lobe, pleural thickening with nodularity, right-sided pleural plaques primarily, medially, and finally large low attenuation masses in the pleural space and fissures bilaterally.  The patient has a long history of smoking, and probably has COPD.  He is being treated for a COPD exacerbation.  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  The patient denies any exposure to tuberculosis, but has never had a PPD placed.  He did, however, live in [place] for many years, but never in the [place].  The patient has very little cough and no congestion, but occasionally produces scant purulent mucus.  He has not had excessive weight loss. PAST MEDICAL HISTORY:  Significant for gout as well as arthritis.  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 FAMILY HISTORY:  Significant for diabetes only. SOCIAL HISTORY:  He is married and has worked for [employer], and [employer] as well as [employer].  He  has a history of smoking 1 pack per day for 52 years, but has not smoked since the beginning of the month. REVIEW OF SYSTEMS:  A 10-point review of systems was negative except for that mentioned in history of present illness.  PHYSICAL EXAMINATION:  GENERAL:  He is a well-developed male, in no acute distress. VITAL SIGNS:  Blood pressure 132/86, pulse is 100, respiratory rate 18. He is afebrile.  Oxygen saturation on room air is 92%-94%. HEENT:  Pupils equal, round, reactive to light and accommodation. Extraocular muscles are intact.  Nares are patent without discharge. Oropharynx is clear. NECK:  Without lymphadenopathy or thyromegaly. CHEST:  Very diminished breath sounds throughout, but no true wheezing. There was prominent upper airway pseudo wheezing that resolved with pursed lip breathing. CARDIAC:  Distant heart sounds but regular, 2/6 systolic murmur. ABDOMEN:  Soft, nontender, nondistended.  Good bowel sounds. GENITAL EXAM, RECTAL EXAM< BREAST EXAM:  Not done and not indicated. EXTREMITIES:  Lower extremities with edema noted, no calf tenderness and no cyanosis.  NEUROLOGICAL:  He is alert and oriented and moves all 4 extremities without deficits.  IMPRESSION: 1. Abnormal CT chest with low attenuation masses in the pleural space     bilaterally and especially in the fissures.  It is unclear whether     this is simply loculated fluid, or whether this is an inflammatory     pleural process.  I would find it very unlikely this is a     malignancy,  but certainly cannot exclude.  The patient has had very     little medical care over his life, and therefore no serial chest x-     rays for comparison.  The patient did have a chest x-ray in 2005     that did not showed these abnormalities.  The patient also has a     ground glass opacity in the left lower lobes that may be     inflammatory, but this may be an early malignancy.  He really does     not have significant mediastinal  adenopathy.  It is very difficult     to put all of this together, but the patient does have     granulomatous changes in his liver and spleen, and had lived in     [place] for many years of his life.  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  I would find this to be a very rare presentation     of sarcoidosis, however, nothing can be excluded at this point.  In     terms of planning, I would support following through with a PET     scan, and see where things stand.  At some point he may need     aspiration of one of these low attenuation masses that are abutting the right chest wall.     The pleural process may be nothing to worry about, and just represent loculated fluid.     I suspect none of this is related to his dyspnea on exertion, which     is probably due to COPD. SUGGESTIONS: 1. would place a PPD for completeness => reported NEG 2. Agree with PET scanning => done at [facility]:  CT Chest 04/05/14 showed cardiomegaly, atherosclerotic calcif in Ao & coronaries, +mediastinal adenopathy w/ largest 1.8cm in right lower paratrach region (favored to be reactive), marked centrilob & mild paraseptal emphysema in upper lobes, diffuse bilat GGOs, focal areas of consolidation bilat, mult loculated effusions along the major fissures bilat & along the right minor fissure (pseudotumors), pleural calcif are seen on the right;  Mult punctate calcif in liver & spleen c/w prior granulomatous dis, multilevel DJD in spine...  PET scan 04/05/14 showed intensely hypermetabolic mass involving the right floor of the mouth extending across the midline, mod to intesne FDG avid LNs concerning for mets;  Mild FDG avid mediastinal adenopathy favored to be reactive;;  No FDG avid pulm nodules, loculated pleural effusions, Abd & bones were neg...    ~  April 12, 2014:  Follow up OV by KC>    The patient comes in today after his recent  hospitalization for a COPD exacerbation.  He was discharged on Spiriva alone with Albuterol for rescue, and has done extremely well since that time. He feels that his breathing is adequate, and he rarely requires his rescue inhaler. He denies any significant cough or mucus production. The patient also has a history of a cardiomyopathy, with chronic congestive heart failure and lower extremity edema. He also has a history of an abnormal CT chest with loculated effusions and pleural calcifications. A recent PET scan that [facility] showed no abnormal uptake within his chest. He is getting ready to start chemotherapy for head and neck cancer next week in [facility]. IMP>>  1) Dyspnea:  The patient has significant dyspnea on exertion that I  suspect is related to COPD.  However, he has not had pulmonary function studies, and we will need these for evaluation. He is currently on Spiriva alone, and feels that he is doing extremely well. I will therefore continue him on this regimen. At some point will refer him to pulmonary rehabilitation, but he is getting ready to start chemotherapy for his head and neck cancer next week. 2) Abn CT Chest:  The patient has multiple abnormal findings on CT chest, but his recent PET scan shows no abnormal uptake within the chest. It is unclear whether his abnormal findings are related to asbestos exposure or possibly old granulomatous disease. The patient also has chronic congestive heart failure, and perhaps these are chronically loculated effusions. ADDENDUM>>  Outpt Full PFTs done 05/04/14:  FVC=3.70 (92%), FEV1=2.72 (89%), %1sec=73, mid-flows reduced at 72% predicted; Post bronchodil FEV1 did NOT improve;  TLC=5.94 (82%);  DLCO=39% & DL/VA=49%... He was asked to ret in 3-4 months but he did not do so-- all medical follow up was from [facility]  ENT, Oncology, RAD-ONC, and Cardiology...    ~  July 01, 2016:  43moROV w/ SN>  FSamajpresents self-referred because "I want to  stop the Spiriva" - this is his whole agenda for the visit, he is here w/ his wife who helps w/ the history >   I have spent several hours pouring over his Epic record from 2015 and [facility] the [facility]>>    Squamous cell Ca floor of the mouth>  Initial eval in [place] by DBarbaraann Faster [facility](not felt to be a surg candidate due to severe CHF/ cardiomyopathy) w/ ChemoRx including carbo/taxol/cetux starting in 04/2014, then cetux+XRT from 9/29 - 08/27/14 for total 70Gy;  He is followed regularly (Q667moby ENT-DrWeissler & XRT-DrChera last seen 04/23/16 & no evid of recurrent or metastatic dis found on f/u CT scan;  Thyroid function tests checked periodically & remain wnl as well;  Last seen by DrWeissler 01/13/16-- 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     Chemotherapy induced peripheral neuropathy>  [facility] in [facility] and feet reported to be improved w/ Cymbalta60/d and Neurontin300Tid (prev on 900Bid but he weaned it on his own) => they signed off 7 indicated that his PCP was taking over this issue...     COPD- mixed chr bronchitis & emphysema (seen on prev CT Chest)>      Cigarette smoker>  He quit transiently during his Chemoradiation but then restarted & currently smoking betw 1/2-1ppd;  He started smoking in teens, has smoked >50 yrs up to 2ppd w/ an est ~80 pack-yr smoking hx overall;  He [facility] a [facility] smoking cessation program but was unable to quit;  They continue CT Chest Q6m98mor follow up evals...    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 as seen on prev CT scans    HBP, CAD> (atherosclerotic changes seen in ao & coronaries on CT Chest), Cath 03/2014 by DrVaranasi showed 50% midLAD stenosis, small Circ w/ 60% stenosis in large ramus branch, mild dis in dominant RCA=> placed on aggressive medical therapy & [facility] on ASA81, MetoprololER50, Lasix20-just taken prn edema; NOTE: he is very sens to Lisinopril w/ hypotension x2 in past;     Non-ischemic cardiomyopathy>  w/ EF=20-25% on 2DEcho 03/2014 assoc w/ mildMR, modTR, PAsys=72mHg;  His initial BNP was >23000;  [facility] w/ medical management (per 2DEcho 12/2014)...    Hyperlipidemia> on Atorva40>  ?last FLP 04/06/14 [facility] TChol 164, [facility]     Hx esophageal food impactions requiring EGD & endoscopic food removal by DrOutlaw 1/16 & 3/16 (believed due to eating large food bolus w/o chewing due to his dental issues, no underlying esoph pathology described); dietary adjustments and dental work recommended    Hx alcohol abuse & prot-cal malnutrition>  He has cut back drinking Etoh but still drinks-- serum proteins/ LFTs/ Alb are all wnl now...    Dental problems>  He has dry mouth & uses Biotene...    Anemia>  His last CBC was Jan2016 w/ Hg= 10.3, renal function is wnl    Pt has declined Flu shots> states allergic w/ rash after Flu shotn yrs ago... EXAM shows Afeb, VSS, O2sat=100% on RA at rest; Wt=165#, 6"Tall, BMI=22.4;  HEENT- teeth poor, scarring floor of mouth, indurated neck tissue;  Chest- decr BS bilat, clear w/o w/r/r;  Heart- RR w/o m/r/g;  Abd- soft, nontender, neg;  Ext- VI, no c/c/e;  Neuro- +neuropathy in hands/ feet...   CT Chest w/ [facility]16 [facility]>  Normal heart size, scat coronary calcif, unchanged pericard calcif, no pericardial fluid;  Stable emphysema w/ biapical scarring, unchanged pleural calcif, no new pulm nodules, no adenopathy;  Scattered hepatic & splenic granulomas;  DDD, no suspicious osseous lesions...   CXR 07/01/16>  Norm heart size, min blunting of angles bilat, clear lungs w/ biapical pleural thickening, NAD; prev bilat pseudotumors have resolved...   Spirometry 07/01/16>  FVC=3.55 (86%), FEV1=2.00 (64%), %1sec=56%,  mid-flows are reduced at 22% predicted... This represents a 25% drop in his FEV1 over the last 243yrdespite his Spiriva Rx...  Ambulatory Oximetry 07/01/16>  O2sat=99% on RA at rest w/ pulse=73/min;  He ambulated 3 Laps (185' each) w/ lowest O2sat=97% w/ pulse 86/min, no desaturations...  IMP/PLAN>>  Julian Machoeems annoyed that his pulm function has deteriorated over the last several yrs- despite his continued smoking, the interval treatment for his SCCa of the mouth, etc; this has occurred despite the Spiriva daily during that interval; I told him that he desperately needs to quit all smoking AND w/ need to add an ICS/LABA to his regimen- continue Spiriva daily & add BREO- one inhalation daily; he can still use an Albut rescue inhaler as needed; recall that his FullPFTs in 2015 showed a severe reduction in DLCO c/w emphysema... His whole objective in obtaining this pulmonary f/u visit was to be able to STOP his Spiriva rx and we have instead reinforced the need to quit smoking & added the [facility] not [facility] w/ these recommendations...    ~  Feb 08, 2017:  7-60m40moV and urgent add-on appt requested by pt's wife for hemoptysis>  As above FreJosph Machos not too pleased that his continued smoking over the last few yrs (despite Dx & Rx for Stage 4 SCCa floor or mouth) lead to worsening COPD & the need for MORE meds (Breo added to his Spiriva/ AlbutHFA);  Not surprisingly he did not take the Breo, continued smoking, and did not follow up w/ us [place] requested; Note: he continued the Spiriva as before;  Pt's wife brings him in today when she heard him coughing & found a tissue w/  blood present=> when pressed for more info & the truth pt indicates that he's had hemoptysis for at least a few weeks- notes cough, mostly dry w/o sputum, occurs day & night, denies SOB or CP, occas notes streaky BRB but he is quite sure there is no sput, denies f/c/s, wife notes some congestion/ rattling/ wheezing but he denies, wife saw blood for  the 1st & only time earlier today, needless to say he has not taken anything extra for this problem...     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  Review of Care Everywhere records reveals:     He saw NP-Lewis [facility]/22/16 for [facility]> HBP, CAD (50% mid-LAD on cath 03/2014), CHF w/ most recent 2DEcho (12/2014) showing EF=50-55% on Metop & Lasix; intol to Lisinopril w/ low BPs; rec to ret in 182yr.    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 & 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 (pt denied hearing problems; he developed osteoradionecrosis of the mandible & is getting hyperbaric oxygen therapy w/ plans to remove the dead bone thereafter by dental medicine- exam w/ bilat exposed bone worse on the left, no evid recurrent cancer;  Latest CT Chest 10/26/16 showed norm heart size, atherosclerotic changes in Ao & coronaries, unchanged pericardial calcif as well, stable biapical scarring & emphysema, no new pulm nodules, unchqnged right pleural calcif, no  adenopathy, bilat gynecomastia, scattered hepatic & splenic granulomas, degen changes in spine;  Latest labs 10/26/16 w/ TSH=4.11 & FreeT4=0.79...  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     Chemotherapy induced peripheral neuropathy>  [facility] reported to be improved w/ Cymbalta60/d and Neurontin300Tid (prev on 900Bid but he weaned it on his own) => they signed off 7 indicated that his PCP was taking over this issue...Marland KitchenMarland Kitchen  COPD- mixed chr bronchitis & emphysema (seen on prev CT Chest)>      Cigarette smoker>  He quit transiently during his Chemoradiation but then restarted & currently smoking betw 1/2-1ppd;  He started smoking in teens, has smoked >50 yrs up to 2ppd w/ an est ~80 pack-yr smoking hx overall;  He went through a [facility] smoking cessation program but was unable to quit;  They continue CT Chest Q3mofor follow up evals...    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 as seen on prev CT scans    HBP, CAD> (atherosclerotic changes seen in ao & coronaries on CT Chest), Cath 03/2014 by DrVaranasi showed 50% midLAD stenosis, small Circ w/ 60% stenosis in large ramus branch, mild dis in dominant RCA=> placed on aggressive medical therapy [facility] last seen 01/20/16> on ASA81, MetoprololER50, Lasix20-just taken prn edema; NOTE: he is very sens to Lisinopril  w/ hypotension x2 in past;     Non-ischemic cardiomyopathy> w/ EF=20-25% on 2DEcho 03/2014 assoc w/ mildMR, modTR, PAsys=538mg;  His initial BNP was >23000;  [facility]-55% w/ medical management (per 2DEcho 12/2014)...    Hyperlipidemia> on Atorva40>  ?last FLP 04/06/14 [facility]Chol 164, [facility]     Hx esophageal food impactions requiring EGD & endoscopic food removal by DrOutlaw 1/16 & 3/16 (believed due to eating large food bolus w/o chewing due to his dental issues, no underlying esoph pathology described); dietary adjustments and dental work recommended    Hx alcohol abuse & prot-cal malnutrition>  He has cut back drinking Etoh but still drinks-- serum proteins/ LFTs/ Alb are all wnl now...    Dental problems>  He has dry mouth & uses Biotene...    HxAnemia>  His last CBC was Jan2016 w/ Hg= 10.3, renal function is wnl    Pt has declined Flu shots> states allergic w/ rash after Flu shotn yrs ago... EXAM shows Afeb, VSS, O2sat=100% on RA at rest; Wt=161#, 6"Tall, BMI=22;  HEENT- edentulous & exposed mandib bone bilat, scarring floor of mouth, indurated neck tissue;  Chest- decr BS bilat, clear w/o w/r/r;  Heart- RR w/o m/r/g;  Abd- soft, nontender, neg;  Ext- VI, no c/c/e;  Neuro- +neuropathy in hands/ feet...   CXR 02/08/17 (independently reviewed by me in the PACS system) showed norm heart size, essentially clear lungs w/ sl blunt angles bilat- NAD...  CT Angiogram Chest 02/09/17 showed norm heart size, atherosclerotic changes in Ao & coronaries; NEG for PE; scat mediastinal & hilar LNs- no masses, no change from prior CT 03/2014; biapical pleuroparenchymal scarring & emphysematous changes- no acute pulm process identified (no worrisome lesions or interstitial process); stable dense right sided pleural calcif (?old infection or trauma?); scat calcif granulomas in liver & spleen...  Ambulatory Oximetry 02/08/17 on RA>  O2sat=100% on RA at rest  w/ pulse=90/min.  He ambulated 2 laps in office w/ lowest O2sat=99% w/ max pulse=99/min (stopped due to difficulty walking & balance issues)...  LABS 02/08/17>  Chems- wnl x BS=123, Cr=1.06, LFTs=wnl;  CBC- ok w/ Hg=12.9, mcv=83, WBC=4.1;  TSH=5.91 (s/p XRT, not on meds);  Sed=99;  BNP=26;  PT/PTT- wnl... IMP/PLAN>>  We discussed immediate therapy w/ LEVAQUIN500Qd, MEDROL Dosepak, Quit all smoking, Hold ASA, Add the Breo one inhalation daily to the Spiriva daily;  OK to resume Hyperbaric treatments, and we plan ROV recheck in 2 wks...  NOTE:  >50% of this  60 min rov was spent in counseling & coordination of care...  ~  Feb 17, 2017:  2wk ROV & pulmonary recheck> Julian Zimmerman has completed his Levaquin & Medrol dosepak and reports a good response w/ resolution of the hemoptysis & appetite improving; he notes persistent cough, small amt congestion but no sput or hemoptysis now; similarly he denies SOB/ CP/ f/c/s/ etc;  Overall his breathing is better he says however he continues to smoke ~1ppd despite all efforts to get him to quit;  Wife notes that Breo costs $126 per month & Spiriva is $90/mo now that they have met all deductible expenses- we discussed getting a copy of their Prescription Drug Formulary to aid [place] in selecting the least expensive/ most effective medication available, and I reminded them that 1ppd at $5 per pack is ~$150/month in cigarettes!      I again reviewed the Care Everywhere notes from [place] pt's wife indicates that he had some dental surg (?debridement ?bone graft?) in April & now the hyperbaric oxygen treatments are in anticipation of his being set up w/ dentures but there are NO [place] NOTES IN EPIC>>  His PCP is Eloise Levels, NP at [facility] but they are not sure of his last visit there... 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     Chemotherapy induced peripheral neuropathy>  [facility] symptoms in hands and feet reported to be improved w/ Cymbalta and Neurontin...    Osteonecrosis of mandible>  Eval by ENT- DrWeissler, XRT- DrChera, & [facility], ?bone grafting, & referred for Hyperbaric oxygen=> currently in middle of 50 treatment program...    COPD- mixed chr bronchitis & emphysema (seen on prev CT Chest)>  Spirometry 06/2016 w/ mod airflow obstruction & deterioration in his FEV1 over the prev 246yr placed on BREO & Spiriva, advised to quit all smoking!    Cigarette smoker>  He quit transiently during his Chemoradiation but then restarted & currently smoking ~1ppd;  He started smoking in teens, has smoked >50 yrs up to 2ppd w/ an est ~80+ pack-yr smoking hx overall;  He went through a [facility] smoking cessation program but was unable to quit;  They continue CT Chest Q6m59mor follow up evals...    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 as seen on prev CT scans    HBP, CAD> (atherosclerotic changes seen in ao & coronaries on CT Chest), Cath 03/2014 by DrVaranasi showed 50% midLAD stenosis, small Circ w/ 60% stenosis in large ramus branch, mild dis in dominant RCA=> placed on aggressive medical therapy & [facility]> on ASA81, MetoprololER50, Lasix20-just taken prn edema; NOTE: he is very sens to Lisinopril w/ hypotension x2 in past;     Non-ischemic cardiomyopathy> w/ EF=20-25% on 2DEcho 03/2014 assoc w/ mildMR, modTR, PAsys=27m54m  His initial BNP was  >23000;  Follow up studies on therapy  at [facility] showed improvement in EF to 50-55% w/ medical management (per 2DEcho 12/2014)...    Hyperlipidemia> on Atorva40>  ?last FLP 04/06/14 showed TChol 164, HDL 61 at [facility]...     Hx esophageal food impactions requiring EGD & endoscopic food removal by DrOutlaw 1/16 & 3/16 (believed due to eating large food bolus w/o chewing due to his dental issues, no underlying esoph pathology described); dietary adjustments and dental work recommended    Hx alcohol abuse & prot-cal malnutrition>  He has cut back drinking Etoh but still drinks-- serum proteins/ LFTs/ Alb are all wnl now...    Dental problems>  He has dry mouth & uses Biotene...    HxAnemia>  His last CBC was Jan2016 w/ Hg= 10.3, renal function is wnl    Pt has declined Flu shots/ vaccinations> states allergic w/ rash after Flu shot yrs ago; reminded to consult w/ his PCP about indicated vaccinations...Marland KitchenMarland KitchenMarland Kitchen EXAM shows Afeb, VSS, O2sat=99% on RA at rest; Wt=158#, 6"Tall, BMI=22;  HEENT- edentulous & exposed mandib bone bilat, scarring floor of mouth, indurated neck tissue;  Chest- decr BS bilat, clear w/o w/r/r;  Heart- RR w/o m/r/g;  Abd- soft, nontender, neg;  Ext- VI, no c/c/e;  Neuro- +neuropathy in hands/ feet...  IMP/PLAN>>  Julian Zimmerman's congestion & hemoptysis have resolved w/ Levaquin & Medrol- now states breathing is at his baseline on Breo & Spiriva daily;  He MUST quit smoking (still at 1ppd) esp to save $$ and afford his meds;  They will get copy of his formulary & we can adjust once we see his coverage; we plan routine recheck in 19mo..   ~  April 20, 2017:  235moOV & [facility] 04/12/17 stating "I slipped and fell, they don't know what happened"; DCSummary reviewed- presented w/ syncope, he was orthostatic, no arrhythmias detected, 2DEcho showed EF improved to 50-55%, no wall motion abn, no DD;  He was given IVF & improved (NOTE: has hx of extreme sensitivity to Lisiinopril w/ hypotension in past); no change  in respiratory meds; he received PT/OT=> disch w/ [facility] follow up by NP-Lewis & 14d Event Monitor applied...     From the pulmonary standpoint- Julian Machondicates that he is breathing OK, min cough, small amt whitish sput, no blood or discoloration, ans denies SOB/ CP/ edema;  He has difficulty ambulating due to his neuropathy- (followed by Neuro & he saw DrPatel in June2018 w/ EMG/NCV done);  He is getting PT/OT at home now trying to incr his mobility but he is unable to exert himself;  NOTE: wife indicates that he likes to sleep on the couch, snores loudly, & is restless & talks in his sleep;  For his part- pt states he rests well, wakes refreshed and denies daytime hypersomnolence BUT will nap 7/7 days and sometimes falls asleep watching TV in eve => we reviewed indication for a Sleep Study & we will set this up for him...    We reviewed his medical history & problem list as above... EXAM shows Afeb, VSS, O2sat=98% on RA at rest; Wt=158#, 6"Tall, BMI=22;  HEENT- edentulous & exposed mandib bone bilat, scarring floor of mouth, indurated neck tissue;  Chest- decr BS bilat, clear w/o w/r/r;  Heart- RR w/o m/r/g;  Abd- soft, nontender, neg;  Ext- VI, no c/c/e;  Neuro- +neuropathy in hands/ feet...   CT Head & Cspine 05/11/17>  IMPRESSION: No evidence of traumatic intracranial or Cspine injury or fracture; Mild cortical volume loss and scattered  small vessel ischemic microangiopathy; Mild degenerative change noted along the cervical spine; Emphysema at the lung apices; Calcification at the carotid bifurcations bilaterally; Near complete opacification of the mastoid air cells bilaterally...  CXR 04/11/17> Borderline heart size, atherosclerosis of the thoracic aorta, hyperinflation and emphysema- no acute abnormality.  2DEcho 04/12/17>  LVF at low end of norm w/ EF=50-55%, no regional wall motion abn, norm diastolic parameters, AoV sclerosis w/o stenosis, norm MV, norm LA&RA, RV was norm & PA pressure could not be  accurately estimated  LABS reviewed>  Chems- ok w/ Cr=0.85, Alb=3.1;  CBC- ok w/ Hg=11.7;  Troponins- neg... IMP/PLAN>>  Julian Zimmerman was adm w/ syncope- he was orthostatic & improved w/ IVF, no arrhythmias found, [facility] has applied a 14d event monitor for completeness; He is very sensitive to ACE inhibitors and should not receive these meds;  From the pulmonary standpoint he says his breathing is stable- asked to STOP ALL SMOKING, take the [facility] & SPIRIVA once daily, gradually increase exercise program;  When asked wife notes that he snores some & is occas restless at night, pt feels that he rests well, and we discussed a screening HOME SLEEP TEST to r/o OSA...   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     Past Medical History:  Diagnosis Date   COPD/ emphysema >> on BREO & Spiriva daily    Tobacco abuse >> he continues to smoke 1ppd & has an ~80+ pack-yr hx   . Arthritis    "right leg" (03/23/2014)   Gout    HBP >> on ToprolXL50, Lasix20    CAD >> nonobstructive CAD on Cath 2015   . Non-ischemic cardiomyopathy >> EF improved from 20-25% in 2015 to 50-55% 12/2014 on Rx   . Hyperlipidemia >> on Lipitor40   . 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 ESOPHAGOGASTRODUODENOSCOPY N/A 12/26/2014    Procedure: ESOPHAGOGASTRODUODENOSCOPY (EGD);  Surgeon: Willis Modena, MD;  Location: [facility];  Service: Endoscopy;  Laterality: N/A;  . FLOOR OF MOUTH BIOPSY  03/2014   "found cancer"  . FOREIGN BODY REMOVAL N/A 10/12/2014   Procedure: FOREIGN BODY REMOVAL;  Surgeon: Petra Kuba, MD;  Location: [facility] ENDOSCOPY;  Service: Endoscopy;  Laterality: N/A;  . FOREIGN BODY REMOVAL N/A 12/26/2014   Procedure: FOREIGN BODY REMOVAL;  Surgeon: Willis Modena, MD;  Location: [facility] ENDOSCOPY;  Service: Endoscopy;  Laterality: N/A;  . FRACTIONAL FLOW RESERVE WIRE  03/27/2014   Procedure: FRACTIONAL FLOW RESERVE WIRE;  Surgeon: Corky Crafts, MD;  Location: [facility] CATH LAB;  Service: Cardiovascular;;  . LEFT AND RIGHT HEART CATHETERIZATION WITH CORONARY ANGIOGRAM N/A 03/27/2014   Procedure: LEFT AND RIGHT HEART CATHETERIZATION WITH CORONARY  ANGIOGRAM;  Surgeon: Jettie Booze, MD;  Location: [facility] CATH LAB;  Service: Cardiovascular;  Laterality: N/A;  . [place]    Outpatient Encounter Prescriptions as of 04/20/2017  Medication Sig  . aspirin EC 81 MG EC tablet Take 1 tablet (81 mg total) by mouth daily.  . bisacodyl (DULCOLAX) 5 MG EC tablet Take 5 mg by mouth daily as needed for mild constipation.   . DULoxetine (CYMBALTA) 30 MG capsule Take 2 capsules everyday in the morning.  . furosemide (LASIX) 20 MG tablet Take 1 tablet (20 mg total) by mouth every other day. (Patient taking differently: Take as needed for noted swelling)  . metoprolol succinate (TOPROL-XL) 50 MG 24 hr tablet Take 50 mg by mouth daily.  [name] tiotropium (SPIRIVA HANDIHALER) 18 MCG inhalation capsule Place 1 capsule (18 mcg total) into inhaler and inhale daily.  . vitamin B-12 (CYANOCOBALAMIN) 1000 MCG tablet Take 1,000 mcg by mouth daily.  . [DISCONTINUED] ammonium lactate (LAC-HYDRIN) 12 % lotion Apply 1 application topically 2 (two) times daily as needed for dry skin.  . [DISCONTINUED] polyethylene glycol  (MIRALAX / GLYCOLAX) packet Take 17 g by mouth daily as needed for mild constipation.    No facility-administered encounter medications on file as of 04/20/2017.     No Known Allergies-- HE IS VERY SENSITIVE TO ACE INHIBITORS AND SHOULD NOT RECEIVE THESE MEDS.   Immunization History  Administered Date(s) Administered  . PPD Test 03/26/2014  . Tdap 04/11/2017    Current Medications, Allergies, Past Medical History, Past Surgical History, Family History, and Social History were reviewed in [employer] record.   Review of Systems             All symptoms NEG except where BOLDED >>  Constitutional:  F/C/S, fatigue, anorexia, unexpected weight change. HEENT:  HA, visual changes, hearing loss, earache, nasal symptoms, sore throat, mouth sores, hoarseness. Resp:  cough, sputum, hemoptysis; SOB, tightness, wheezing. Cardio:  CP, palpit, DOE, orthopnea, edema. GI:  N/V/D/C, blood in stool; reflux, abd pain, distention, gas. GU:  dysuria, freq, urgency, hematuria, flank pain, voiding difficulty. MS:  joint pain, swelling, tenderness, decr ROM; neck pain, back pain, etc. Neuro:  HA, tremors, seizures, dizziness, syncope, weakness, numbness, gait abn. Skin:  suspicious lesions or skin rash. Heme:  adenopathy, bruising, bleeding. Psyche:  confusion, agitation, sleep disturbance, hallucinations, anxiety, depression suicidal.   Objective:   Physical Exam       Vital Signs:  Reviewed...   General:  WD,Thin, 71 y/o BM chr ill appearing, in NAD; alert & oriented; pleasant & cooperative, somewhat anxious... HEENT:  [place]/AT; Conjunctiva- pink, Sclera- nonicteric, EOM-wnl, PERRLA, EACs-wax, TMs-obscured; NOSE-clear; THROAT-clear, no lesions seen. Neck:  Supple w/ fair ROM; no JVD; normal carotid impulses w/o bruits; no thyromegaly or nodules palpated; no lymphadenopathy; indurated thickened tissue Chest:  Decr BS bilat without wheezes, rales, or rhonchi heard. Heart:  Regular  Rhythm; norm S1 & S2 without murmurs, rubs, or gallops detected. Abdomen:  Soft & nontender- no guarding or rebound; normal bowel sounds; no organomegaly or masses palpated, sm umbil hernia. Ext:  Abn posture & unable to flex fingers 345 left hand w/ resting tremor; otherw fair ROM, +arthritic changes, +ven insuffic/ dermatitis, no edema... Neuro:  CNs II-XII intact; motor testing normal; +periph neuropathy, balance off... Derm:  No lesions noted; no rash etc. Lymph:  No cervical, supraclavicular, axillary, or inguinal adenopathy palpated.   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    4) COPD- mixed chr bronchitis & emphysema (seen on prev CT Chest)>      5) Cigarette smoker>  He quit transiently during his Chemoradiation but then restarted & currently smoking betw 1/2-1ppd;  He started smoking in teens, has smoked >50 yrs up to 2ppd w/ an est ~80 pack-yr smoking hx overall;  He went through a [facility] smoking  cessation program but was unable to quit;  They continue CT Chest Q31mo for follow up evals...    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 as seen on prev CT scans    8) HBP, CAD> (atherosclerotic changes seen in ao & coronaries on CT Chest), Cath 03/2014 by DrVaranasi showed 50% midLAD stenosis, small Circ w/ 60% stenosis in large ramus branch, mild dis in dominant RCA=> placed on aggressive medical therapy & followed by Cards at [facility]-- last seen 01/20/16> on ASA81, MetoprololER50, Lasix20-just taken prn edema; NOTE: he is very sens to Lisinopril w/ hypotension x2 in past;     9) Non-ischemic cardiomyopathy> w/ EF=20-25% on 2DEcho 03/2014 assoc w/ mildMR, modTR, PAsys=15mmHg;  His initial BNP was >23000;  Follow up studies on therapy at [facility] showed improvement in EF to 50-55% w/ medical management (per 2DEcho 12/2014)...    10) Hyperlipidemia> on Atorva40>  ?last FLP 04/06/14 showed TChol 164, HDL 61 at [facility]...     11) Hx esophageal food impactions requiring EGD & endoscopic food removal by DrOutlaw 1/16 & 3/16 (believed due to eating large food bolus w/o chewing due to his dental issues, no underlying esoph pathology described); dietary adjustments and dental work recommended    12) Hx alcohol abuse & prot-cal malnutrition>  He has cut back drinking Etoh but still drinks-- serum proteins/ LFTs/ Alb are all wnl now...    13) Dental problems>  He has dry mouth & uses Biotene...    14) HxAnemia>  His last CBC was Jan2016 w/ Hg= 10.3, renal function is wnl, and   PLAN >>  07/01/16>   Julian Zimmerman seems annoyed that his pulm function has deteriorated over the last several yrs- despite his continued smoking, the interval treatment for his SCCa of the mouth, etc; this has occurred despite the Spiriva daily during that interval; I told him that he  desperately needs to quit all  smoking AND w/ need to add an ICS/LABA to his regimen- continue Spiriva daily & add BREO- one inhalation daily; he can still use an Albut rescue inhaler as needed; recall that his FullPFTs in 2015 showed a severe reduction in DLCO c/w emphysema... His whole objective in obtaining this pulmonary f/u visit was to be able to STOP his Spiriva rx and we have instead reinforced the need to quit smoking & added the [facility], he was not pleased w/ these recommendations 02/08/17>   We discussed immediate therapy w/ [id number], MEDROL Dosepak, Quit all smoking, Hold ASA, Add the Breo one inhalation daily to the Spiriva daily;  OK to resume Hyperbaric treatments, and we plan ROV recheck in 2 wks.. 02/17/17>   Julian Zimmerman's congestion & hemoptysis have resolved w/ Levaquin & Medrol- now states breathing is at his baseline on Breo & Spiriva daily;  He MUST quit smoking (still at 1ppd) esp to save $$ and afford his meds;  They will get copy of his formulary & we can adjust once we see his coverage; we plan routine recheck in 66mo 04/20/17>   FJosph Zimmerman adm w/ syncope- he was orthostatic & improved w/ IVF, no arrhythmias found, [facility] has applied a 14d event monitor for completeness; He is very sensitive to ACE inhibitors and should not receive these meds;  From the pulmonary standpoint he says his breathing is stable- asked to STOP ALL SMOKING, take the [place]once daily, gradually increase exercise program;  When asked wife notes that he snores some & is occas restless at night, pt feels that he rests well, and we discussed a screening HOME SLEEP TEST to r/o OSA.     Plan:     Patient's Medications  New Prescriptions   No medications on file  Previous Medications   ASPIRIN EC 81 MG EC TABLET    Take 1 tablet (81 mg total) by mouth daily.   BISACODYL (DULCOLAX) 5 MG EC TABLET    Take 5 mg by mouth daily as needed for mild constipation.    DULOXETINE (CYMBALTA) 30 MG CAPSULE    Take 2 capsules everyday in the morning.    FUROSEMIDE (LASIX) 20 MG TABLET    Take 1 tablet (20 mg total) by mouth every other day.   METOPROLOL SUCCINATE (TOPROL-XL) 50 MG 24 HR TABLET    Take 50 mg by mouth daily.   TIOTROPIUM (SPIRIVA HANDIHALER) 18 MCG INHALATION CAPSULE    Place 1 capsule (18 mcg total) into inhaler and inhale daily.   VITAMIN B-12 (CYANOCOBALAMIN) 1000 MCG TABLET    Take 1,000 mcg by mouth daily.  Modified Medications   No medications on file  Discontinued Medications   AMMONIUM LACTATE (LAC-HYDRIN) 12 % LOTION    Apply 1 application topically 2 (two) times daily as needed for dry skin.   POLYETHYLENE GLYCOL (MIRALAX / GLYCOLAX) PACKET    Take 17 g by mouth daily as needed for mild constipation.

## 2017-04-20 NOTE — Patient Instructions (Signed)
Today we updated your med list in our EPIC system...    Continue your current medications the same...  Today we did an ambulatory oximetry test... We will arrange for a [place] to be done soon...    We will contact you w/ the results when available...   Based on the sleep study results-- we will set up a test called a CPAP titration to be done at the [facility] (this is so we can get him the best mask fit & let him try the different apparatus avail)...  [name]-- QUIT SMOKING!!!  (you promised to quit 7/24- H[employer]Y ANNIVERSARY!)  Call for any questions...  Let's plan a follow up visit in 86mo, sooner if needed for problems.Marland KitchenMarland Kitchen

## 2017-04-21 DIAGNOSIS — I951 Orthostatic hypotension: Secondary | ICD-10-CM | POA: Diagnosis not present

## 2017-04-21 DIAGNOSIS — S0181XD Laceration without foreign body of other part of head, subsequent encounter: Secondary | ICD-10-CM | POA: Diagnosis not present

## 2017-04-21 DIAGNOSIS — R55 Syncope and collapse: Secondary | ICD-10-CM | POA: Diagnosis not present

## 2017-04-21 DIAGNOSIS — I5022 Chronic systolic (congestive) heart failure: Secondary | ICD-10-CM | POA: Diagnosis not present

## 2017-04-21 DIAGNOSIS — I110 Hypertensive heart disease with heart failure: Secondary | ICD-10-CM | POA: Diagnosis not present

## 2017-04-21 DIAGNOSIS — J439 Emphysema, unspecified: Secondary | ICD-10-CM | POA: Diagnosis not present

## 2017-04-21 DIAGNOSIS — D649 Anemia, unspecified: Secondary | ICD-10-CM | POA: Diagnosis not present

## 2017-04-22 DIAGNOSIS — D72829 Elevated white blood cell count, unspecified: Secondary | ICD-10-CM | POA: Diagnosis not present

## 2017-04-23 DIAGNOSIS — I110 Hypertensive heart disease with heart failure: Secondary | ICD-10-CM | POA: Diagnosis not present

## 2017-04-23 DIAGNOSIS — I951 Orthostatic hypotension: Secondary | ICD-10-CM | POA: Diagnosis not present

## 2017-04-23 DIAGNOSIS — J439 Emphysema, unspecified: Secondary | ICD-10-CM | POA: Diagnosis not present

## 2017-04-23 DIAGNOSIS — I5022 Chronic systolic (congestive) heart failure: Secondary | ICD-10-CM | POA: Diagnosis not present

## 2017-04-23 DIAGNOSIS — S0181XD Laceration without foreign body of other part of head, subsequent encounter: Secondary | ICD-10-CM | POA: Diagnosis not present

## 2017-04-23 DIAGNOSIS — D649 Anemia, unspecified: Secondary | ICD-10-CM | POA: Diagnosis not present

## 2017-04-26 DIAGNOSIS — J439 Emphysema, unspecified: Secondary | ICD-10-CM | POA: Diagnosis not present

## 2017-04-26 DIAGNOSIS — I951 Orthostatic hypotension: Secondary | ICD-10-CM | POA: Diagnosis not present

## 2017-04-26 DIAGNOSIS — I5022 Chronic systolic (congestive) heart failure: Secondary | ICD-10-CM | POA: Diagnosis not present

## 2017-04-26 DIAGNOSIS — S0181XD Laceration without foreign body of other part of head, subsequent encounter: Secondary | ICD-10-CM | POA: Diagnosis not present

## 2017-04-26 DIAGNOSIS — D649 Anemia, unspecified: Secondary | ICD-10-CM | POA: Diagnosis not present

## 2017-04-26 DIAGNOSIS — I110 Hypertensive heart disease with heart failure: Secondary | ICD-10-CM | POA: Diagnosis not present

## 2017-04-28 DIAGNOSIS — I5022 Chronic systolic (congestive) heart failure: Secondary | ICD-10-CM | POA: Diagnosis not present

## 2017-04-28 DIAGNOSIS — I951 Orthostatic hypotension: Secondary | ICD-10-CM | POA: Diagnosis not present

## 2017-04-28 DIAGNOSIS — D649 Anemia, unspecified: Secondary | ICD-10-CM | POA: Diagnosis not present

## 2017-04-28 DIAGNOSIS — S0181XD Laceration without foreign body of other part of head, subsequent encounter: Secondary | ICD-10-CM | POA: Diagnosis not present

## 2017-04-28 DIAGNOSIS — I110 Hypertensive heart disease with heart failure: Secondary | ICD-10-CM | POA: Diagnosis not present

## 2017-04-28 DIAGNOSIS — J439 Emphysema, unspecified: Secondary | ICD-10-CM | POA: Diagnosis not present

## 2017-04-30 DIAGNOSIS — I951 Orthostatic hypotension: Secondary | ICD-10-CM | POA: Diagnosis not present

## 2017-04-30 DIAGNOSIS — J439 Emphysema, unspecified: Secondary | ICD-10-CM | POA: Diagnosis not present

## 2017-04-30 DIAGNOSIS — I110 Hypertensive heart disease with heart failure: Secondary | ICD-10-CM | POA: Diagnosis not present

## 2017-04-30 DIAGNOSIS — I5022 Chronic systolic (congestive) heart failure: Secondary | ICD-10-CM | POA: Diagnosis not present

## 2017-04-30 DIAGNOSIS — D649 Anemia, unspecified: Secondary | ICD-10-CM | POA: Diagnosis not present

## 2017-04-30 DIAGNOSIS — S0181XD Laceration without foreign body of other part of head, subsequent encounter: Secondary | ICD-10-CM | POA: Diagnosis not present

## 2017-05-03 ENCOUNTER — Telehealth: Payer: Self-pay | Admitting: Pulmonary Disease

## 2017-05-03 DIAGNOSIS — I951 Orthostatic hypotension: Secondary | ICD-10-CM | POA: Diagnosis not present

## 2017-05-03 DIAGNOSIS — J439 Emphysema, unspecified: Secondary | ICD-10-CM | POA: Diagnosis not present

## 2017-05-03 DIAGNOSIS — I5022 Chronic systolic (congestive) heart failure: Secondary | ICD-10-CM | POA: Diagnosis not present

## 2017-05-03 DIAGNOSIS — D649 Anemia, unspecified: Secondary | ICD-10-CM | POA: Diagnosis not present

## 2017-05-03 DIAGNOSIS — I110 Hypertensive heart disease with heart failure: Secondary | ICD-10-CM | POA: Diagnosis not present

## 2017-05-03 DIAGNOSIS — S0181XD Laceration without foreign body of other part of head, subsequent encounter: Secondary | ICD-10-CM | POA: Diagnosis not present

## 2017-05-04 DIAGNOSIS — I951 Orthostatic hypotension: Secondary | ICD-10-CM | POA: Diagnosis not present

## 2017-05-04 DIAGNOSIS — D649 Anemia, unspecified: Secondary | ICD-10-CM | POA: Diagnosis not present

## 2017-05-04 DIAGNOSIS — I5022 Chronic systolic (congestive) heart failure: Secondary | ICD-10-CM | POA: Diagnosis not present

## 2017-05-04 DIAGNOSIS — S0181XD Laceration without foreign body of other part of head, subsequent encounter: Secondary | ICD-10-CM | POA: Diagnosis not present

## 2017-05-04 DIAGNOSIS — J439 Emphysema, unspecified: Secondary | ICD-10-CM | POA: Diagnosis not present

## 2017-05-04 DIAGNOSIS — I110 Hypertensive heart disease with heart failure: Secondary | ICD-10-CM | POA: Diagnosis not present

## 2017-05-05 DIAGNOSIS — D649 Anemia, unspecified: Secondary | ICD-10-CM | POA: Diagnosis not present

## 2017-05-05 DIAGNOSIS — J439 Emphysema, unspecified: Secondary | ICD-10-CM | POA: Diagnosis not present

## 2017-05-05 DIAGNOSIS — I951 Orthostatic hypotension: Secondary | ICD-10-CM | POA: Diagnosis not present

## 2017-05-05 DIAGNOSIS — I110 Hypertensive heart disease with heart failure: Secondary | ICD-10-CM | POA: Diagnosis not present

## 2017-05-05 DIAGNOSIS — S0181XD Laceration without foreign body of other part of head, subsequent encounter: Secondary | ICD-10-CM | POA: Diagnosis not present

## 2017-05-05 DIAGNOSIS — I5022 Chronic systolic (congestive) heart failure: Secondary | ICD-10-CM | POA: Diagnosis not present

## 2017-05-05 NOTE — Telephone Encounter (Signed)
I spoke with Julian Zimmerman and explained that there was a phone message from her about scheduling the HST. When I spoke with her she stated she had a message and was calling back. We already had the HST scheduled for 05/06/2017 @ 2:00pm. Nothing else needed

## 2017-05-05 NOTE — Telephone Encounter (Signed)
[facility] please contact this patient about his home sleep study. Thanks.

## 2017-05-06 DIAGNOSIS — G4733 Obstructive sleep apnea (adult) (pediatric): Secondary | ICD-10-CM | POA: Diagnosis not present

## 2017-05-07 DIAGNOSIS — J439 Emphysema, unspecified: Secondary | ICD-10-CM | POA: Diagnosis not present

## 2017-05-07 DIAGNOSIS — I110 Hypertensive heart disease with heart failure: Secondary | ICD-10-CM | POA: Diagnosis not present

## 2017-05-07 DIAGNOSIS — S0181XD Laceration without foreign body of other part of head, subsequent encounter: Secondary | ICD-10-CM | POA: Diagnosis not present

## 2017-05-07 DIAGNOSIS — I951 Orthostatic hypotension: Secondary | ICD-10-CM | POA: Diagnosis not present

## 2017-05-07 DIAGNOSIS — D649 Anemia, unspecified: Secondary | ICD-10-CM | POA: Diagnosis not present

## 2017-05-07 DIAGNOSIS — I5022 Chronic systolic (congestive) heart failure: Secondary | ICD-10-CM | POA: Diagnosis not present

## 2017-05-11 DIAGNOSIS — G4733 Obstructive sleep apnea (adult) (pediatric): Secondary | ICD-10-CM | POA: Diagnosis not present

## 2017-05-13 ENCOUNTER — Other Ambulatory Visit: Payer: Self-pay | Admitting: *Deleted

## 2017-05-13 DIAGNOSIS — F17210 Nicotine dependence, cigarettes, uncomplicated: Secondary | ICD-10-CM

## 2017-05-13 DIAGNOSIS — J449 Chronic obstructive pulmonary disease, unspecified: Secondary | ICD-10-CM

## 2017-06-17 DIAGNOSIS — G620 Drug-induced polyneuropathy: Secondary | ICD-10-CM | POA: Diagnosis not present

## 2017-06-17 DIAGNOSIS — C069 Malignant neoplasm of mouth, unspecified: Secondary | ICD-10-CM | POA: Diagnosis not present

## 2017-06-17 DIAGNOSIS — J449 Chronic obstructive pulmonary disease, unspecified: Secondary | ICD-10-CM | POA: Diagnosis not present

## 2017-06-17 DIAGNOSIS — F17210 Nicotine dependence, cigarettes, uncomplicated: Secondary | ICD-10-CM | POA: Diagnosis not present

## 2017-07-05 ENCOUNTER — Ambulatory Visit: Payer: Medicare Other | Admitting: Pulmonary Disease

## 2017-07-12 ENCOUNTER — Other Ambulatory Visit: Payer: Self-pay | Admitting: Pulmonary Disease

## 2017-07-12 DIAGNOSIS — I5020 Unspecified systolic (congestive) heart failure: Secondary | ICD-10-CM | POA: Diagnosis not present

## 2017-07-12 DIAGNOSIS — C049 Malignant neoplasm of floor of mouth, unspecified: Secondary | ICD-10-CM | POA: Diagnosis not present

## 2017-07-12 DIAGNOSIS — Y842 Radiological procedure and radiotherapy as the cause of abnormal reaction of the patient, or of later complication, without mention of misadventure at the time of the procedure: Secondary | ICD-10-CM | POA: Diagnosis not present

## 2017-07-12 DIAGNOSIS — Z6821 Body mass index (BMI) 21.0-21.9, adult: Secondary | ICD-10-CM | POA: Diagnosis not present

## 2017-07-12 DIAGNOSIS — I428 Other cardiomyopathies: Secondary | ICD-10-CM | POA: Diagnosis not present

## 2017-07-12 DIAGNOSIS — M8730 Other secondary osteonecrosis, unspecified bone: Secondary | ICD-10-CM | POA: Diagnosis not present

## 2017-07-22 DIAGNOSIS — J449 Chronic obstructive pulmonary disease, unspecified: Secondary | ICD-10-CM | POA: Diagnosis not present

## 2017-07-22 DIAGNOSIS — G620 Drug-induced polyneuropathy: Secondary | ICD-10-CM | POA: Diagnosis not present

## 2017-07-22 DIAGNOSIS — Z79899 Other long term (current) drug therapy: Secondary | ICD-10-CM | POA: Diagnosis not present

## 2017-07-22 DIAGNOSIS — Z923 Personal history of irradiation: Secondary | ICD-10-CM | POA: Diagnosis not present

## 2017-07-22 DIAGNOSIS — Z7982 Long term (current) use of aspirin: Secondary | ICD-10-CM | POA: Diagnosis not present

## 2017-07-22 DIAGNOSIS — F17210 Nicotine dependence, cigarettes, uncomplicated: Secondary | ICD-10-CM | POA: Diagnosis not present

## 2017-07-22 DIAGNOSIS — Z6821 Body mass index (BMI) 21.0-21.9, adult: Secondary | ICD-10-CM | POA: Diagnosis not present

## 2017-07-22 DIAGNOSIS — Z9221 Personal history of antineoplastic chemotherapy: Secondary | ICD-10-CM | POA: Diagnosis not present

## 2017-07-22 DIAGNOSIS — I2510 Atherosclerotic heart disease of native coronary artery without angina pectoris: Secondary | ICD-10-CM | POA: Diagnosis not present

## 2017-07-22 DIAGNOSIS — T451X5D Adverse effect of antineoplastic and immunosuppressive drugs, subsequent encounter: Secondary | ICD-10-CM | POA: Diagnosis not present

## 2017-07-22 DIAGNOSIS — I10 Essential (primary) hypertension: Secondary | ICD-10-CM | POA: Diagnosis not present

## 2017-07-22 DIAGNOSIS — Z23 Encounter for immunization: Secondary | ICD-10-CM | POA: Diagnosis not present

## 2017-07-22 DIAGNOSIS — I110 Hypertensive heart disease with heart failure: Secondary | ICD-10-CM | POA: Diagnosis not present

## 2017-07-22 DIAGNOSIS — I5020 Unspecified systolic (congestive) heart failure: Secondary | ICD-10-CM | POA: Diagnosis not present

## 2017-07-22 DIAGNOSIS — I5022 Chronic systolic (congestive) heart failure: Secondary | ICD-10-CM | POA: Diagnosis not present

## 2017-09-06 ENCOUNTER — Ambulatory Visit (INDEPENDENT_AMBULATORY_CARE_PROVIDER_SITE_OTHER): Payer: Medicare Other | Admitting: Neurology

## 2017-09-06 ENCOUNTER — Encounter: Payer: Self-pay | Admitting: Neurology

## 2017-09-06 VITALS — BP 110/80 | HR 86 | Ht 72.0 in | Wt 162.0 lb

## 2017-09-06 DIAGNOSIS — I2510 Atherosclerotic heart disease of native coronary artery without angina pectoris: Secondary | ICD-10-CM

## 2017-09-06 DIAGNOSIS — T451X5A Adverse effect of antineoplastic and immunosuppressive drugs, initial encounter: Secondary | ICD-10-CM | POA: Diagnosis not present

## 2017-09-06 DIAGNOSIS — G5620 Lesion of ulnar nerve, unspecified upper limb: Secondary | ICD-10-CM

## 2017-09-06 DIAGNOSIS — G620 Drug-induced polyneuropathy: Secondary | ICD-10-CM | POA: Diagnosis not present

## 2017-09-06 DIAGNOSIS — T65294D Toxic effect of other tobacco and nicotine, undetermined, subsequent encounter: Secondary | ICD-10-CM

## 2017-09-06 DIAGNOSIS — G250 Essential tremor: Secondary | ICD-10-CM | POA: Diagnosis not present

## 2017-09-06 DIAGNOSIS — F17210 Nicotine dependence, cigarettes, uncomplicated: Secondary | ICD-10-CM

## 2017-09-06 MED ORDER — DULOXETINE HCL 30 MG PO CPEP: 30.0000 mg | ORAL_CAPSULE | Freq: Every day | ORAL | 11 refills | Status: AC

## 2017-09-06 NOTE — Progress Notes (Signed)
Follow-up Visit   Date: 09/06/17    Julian Zimmerman MRN: [id number] DOB: 1946/03/07   Interim History: Julian Zimmerman is a 71 y.o. 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  The patient was accompanied to the clinic by wife who also provides collateral information.    History of present illness: He was diagnosed with squamous cell carcinoma in 2015 and treated with chemotherapy and radiation.  When he started chemotherapy, he developed burning and tingling pain of the hands and feet.  Over the the years, the discomfort of the hands have improved.  He has not had any improvement with neuropathy affecting the feet and his wife reports that he is very sensitive even to light touch.  He currently takes gabapentin 300mg  TID and Cymbalta 60mg  daily.  He would like to reduce the dose because he does not have the pain as severe as previously.  He has history of alcoholism for 12 years and used to drink fifth of whiskey every 3 days.  He has been sober since 2015.  He has no personal history of diabetes.  No family history of neuropathy.  His wife also has noticed his hands can tremor when he is doing certain tasks, such as reaching for things.  It can be present at rest also.  No family history of tremor.  His tremor does not bother him or interfere with his day-to-day activities.  He denies any slowed movements or stiffness.  He walk unassisted and does endorse some imbalance.  UPDATE 03/08/2017:  He is here for 6 month follow-up visit.  He has notice the his left hand is weakness and he is unable flex his middle and ring finger.  He has difficulty manipulating silverware on the left hand and cannot use buttons or zippers, so now only wears elastic pants and tshirts.  His tremors are  stable and do not bother him.  His wife states that he quit alcohol around March and was able to taper gabapentin without any worsening paresthesias.   UPDATE 09/06/2017:  He is here for 6 month follow-up visit.  Overall, there has been no marked change in his hand weakness, tremors, or neuropathy.  He denies any painful paresthesias and takes Cymbalta 60mg /d for neuropathy.  Tremors are intermittent and mostly bothersome when he is manipulating objects (zippers, cups, etc).  He does not wish to start any new medications.   Medications:  Current Outpatient Medications on File Prior to Visit  Medication Sig Dispense Refill  . aspirin EC 81 MG EC tablet Take 1 tablet (81 mg total) by mouth daily. 30 tablet 0  . Cholecalciferol (VITAMIN D3) 1000 units CAPS Take 2,000 Int'l Units by mouth.    . metoprolol succinate (TOPROL-XL) 50 MG 24 hr tablet Take 50 mg by mouth daily.    [name] SPIRIVA HANDIHALER 18 MCG inhalation capsule PLACE 1 CAPSULE INTO INHALER AND INHALE DAILY 30 capsule 11  . tiotropium (SPIRIVA HANDIHALER) 18 MCG inhalation capsule Place 1 capsule (18 mcg total) into inhaler and inhale daily. 90 capsule 3  . vitamin B-12 (CYANOCOBALAMIN) 1000 MCG tablet Take 1,000 mcg by mouth daily.     No current facility-administered medications on file prior to visit.     Allergies: No Known Allergies  Review of Systems:  CONSTITUTIONAL: No fevers, chills, night sweats, or weight  loss.  EYES: No visual changes or eye pain ENT: No hearing changes.  No history of nose bleeds.   RESPIRATORY: No cough, wheezing and shortness of breath.   CARDIOVASCULAR: Negative for chest pain, and palpitations.   GI: Negative for abdominal discomfort, blood in stools or black stools.  No recent change in bowel habits.   GU:  No history of incontinence.   MUSCLOSKELETAL: +history of joint pain or swelling.  No myalgias.   SKIN: Negative for lesions, rash, and itching.   ENDOCRINE: Negative for cold or heat  intolerance, polydipsia or goiter.   PSYCH:  No depression or anxiety symptoms.   NEURO: As Above.   Vital Signs:  BP 110/80   Pulse 86   Ht 6' (1.829 m)   Wt 162 lb (73.5 kg)   SpO2 99%   BMI 21.97 kg/m   Neurological Exam: MENTAL STATUS including orientation to time, place, person, recent and remote memory, attention span and concentration, language, and fund of knowledge is normal.  Speech is not dysarthric.  CRANIAL NERVES: Face is symmetric.  MOTOR:  Instrinsic hand atrophy.  I do not appreciate any resting tremor on his exam today.  Hand tremor is worse with finger to nose testing at end point.   Tone is normal.    Right Upper Extremity:    Left Upper Extremity:    Deltoid  5/5   Deltoid  5/5   Biceps  5/5   Biceps  5/5   Triceps  5/5   Triceps  5/5   Wrist extensors  5/5   Wrist extensors  5/5   Wrist flexors  5/5   Wrist flexors  5/5   Finger extensors  5-/5   Finger extensors  5-/5   Finger flexors  5-/5   Finger flexors  4/5   Dorsal interossei  4/5   Dorsal interossei  4/5   Abductor pollicis  5-/5   Abductor pollicis  5-/5   Tone (Ashworth scale)  0  Tone (Ashworth scale)  0   Right Lower Extremity:    Left Lower Extremity:    Hip flexors  5/5   Hip flexors  5/5    MSRs:    Right                            Left brachioradialis 2+  brachioradialis 2+  biceps 2+  biceps 2+  triceps 2+  triceps 2+  patellar 1+  Patellar 1+  ankle jerk 0  ankle jerk 0  Hoffman no  Hoffman no  plantar response down  plantar response down   SENSORY:  Reduced vibration distal to ankles bilaterally.   COORDINATION/GAIT:  There is a intention tremor of the hands with finger to nose testing. Intact rapid alternating movements bilaterally.  Able to rise from a chair without using arms.  Gait wide based and stable, he prefers to keep shoulders extended, mildly stooped posture.  Arm swing is reduced bilaterally.   Data: Labs 08/31/2016:  Vitamin B12  >1500, vitamin B1 10, copper 201, ceruloplasmin 46  NCS/EMG of the 03/11/2017: 1. The electrophysiologic findings are most consistent with a severe and chronic sensory polyneuropathy, axon loss in type, affecting the upper extremities. 2. 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 3. There is no definite evidence of a cervical radiculopathy.  Lab Results  Component Value Date   ALT 11 (L) 04/12/2017  AST 14 (L) 04/12/2017   ALKPHOS 78 04/12/2017   BILITOT 0.6 04/12/2017    IMPRESSION/PLAN: 1.  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  He quit alcohol in March 2018 and I praised him for abstaining from this. He was able to taper off gabapentin and did not notice any worsening paresthesias, with numbness being the primary symptoms.  Recommend reducing Cymbalta 30mg  daily to see if he needs to be on this medication. Encouraged to use a cane, especially on uneven surfaces  2.  Bilateral hand weakness due to bilateral ulnar neuropathy, stable.  Continue to use elbow brace and avoid hyperflexion at the elbow  3.  Essential tremor of the hands. Patient's symptoms too mild to start any medications. Follow clinically.  4.  Tobacco abuse with history of cancer.  Currently smoking 1 packs/day.  Patient was informed of the dangers of tobacco abuse including stroke, cancer, and MI, as well as benefits of tobacco cessation. Patient is not willing to quit at this time.  Approximately 5 mins were spent counseling patient cessation techniques. We discussed various methods to help quit smoking, including deciding on a date to quit, joining a support group, pharmacological agents- nicotine gum/patch/lozenges, chantix. I will reassess his progress at his next follow-up visit  Return to clinic in 1 year  Greater than 50% of this 30 minute visit was spent in counseling, explanation of diagnosis, planning of further management, and coordination of  care.    Thank you for allowing me to participate in patient's care.  If I can answer any additional questions, I would be pleased to do so.    Sincerely,    Donika K. Posey Pronto, DO

## 2017-09-06 NOTE — Patient Instructions (Addendum)
1.  Reduce to Cymbalta 30mg  daily 2.  Encouraged to stop smoking 3.  Return to clinic in 1 year

## 2017-09-09 DIAGNOSIS — H25813 Combined forms of age-related cataract, bilateral: Secondary | ICD-10-CM | POA: Diagnosis not present

## 2017-09-09 DIAGNOSIS — H5213 Myopia, bilateral: Secondary | ICD-10-CM | POA: Diagnosis not present

## 2017-09-09 DIAGNOSIS — H52223 Regular astigmatism, bilateral: Secondary | ICD-10-CM | POA: Diagnosis not present

## 2017-09-09 DIAGNOSIS — H04123 Dry eye syndrome of bilateral lacrimal glands: Secondary | ICD-10-CM | POA: Diagnosis not present

## 2017-09-09 DIAGNOSIS — H524 Presbyopia: Secondary | ICD-10-CM | POA: Diagnosis not present

## 2017-09-09 DIAGNOSIS — H11151 Pinguecula, right eye: Secondary | ICD-10-CM | POA: Diagnosis not present

## 2017-09-29 ENCOUNTER — Other Ambulatory Visit: Payer: Self-pay | Admitting: Internal Medicine

## 2017-09-29 ENCOUNTER — Ambulatory Visit
Admission: RE | Admit: 2017-09-29 | Discharge: 2017-09-29 | Disposition: A | Discharge status: Home / Self Care | Payer: Medicare Other | Source: Ambulatory Visit | Attending: Internal Medicine | Admitting: Internal Medicine

## 2017-09-29 DIAGNOSIS — R590 Localized enlarged lymph nodes: Secondary | ICD-10-CM | POA: Diagnosis not present

## 2017-09-29 DIAGNOSIS — L0201 Cutaneous abscess of face: Secondary | ICD-10-CM

## 2017-09-29 MED ORDER — IOPAMIDOL (ISOVUE-300) INJECTION 61%
75.0000 mL | Freq: Once | INTRAVENOUS | Status: AC | PRN
  Administered 2017-09-29: 75 mL via INTRAVENOUS

## 2017-10-03 ENCOUNTER — Encounter (HOSPITAL_COMMUNITY): Payer: Self-pay | Admitting: Emergency Medicine

## 2017-10-03 ENCOUNTER — Emergency Department (HOSPITAL_COMMUNITY)
Admission: EM | Admit: 2017-10-03 | Discharge: 2017-10-03 | Disposition: A | Discharge status: Home / Self Care | Payer: Medicare Other | Attending: Emergency Medicine | Admitting: Emergency Medicine

## 2017-10-03 ENCOUNTER — Other Ambulatory Visit: Payer: Self-pay

## 2017-10-03 ENCOUNTER — Emergency Department (HOSPITAL_COMMUNITY): Payer: Medicare Other

## 2017-10-03 DIAGNOSIS — R404 Transient alteration of awareness: Secondary | ICD-10-CM | POA: Diagnosis not present

## 2017-10-03 DIAGNOSIS — F17210 Nicotine dependence, cigarettes, uncomplicated: Secondary | ICD-10-CM | POA: Insufficient documentation

## 2017-10-03 DIAGNOSIS — I110 Hypertensive heart disease with heart failure: Secondary | ICD-10-CM | POA: Diagnosis not present

## 2017-10-03 DIAGNOSIS — Z79899 Other long term (current) drug therapy: Secondary | ICD-10-CM | POA: Diagnosis not present

## 2017-10-03 DIAGNOSIS — R55 Syncope and collapse: Secondary | ICD-10-CM

## 2017-10-03 DIAGNOSIS — I5022 Chronic systolic (congestive) heart failure: Secondary | ICD-10-CM | POA: Insufficient documentation

## 2017-10-03 DIAGNOSIS — J449 Chronic obstructive pulmonary disease, unspecified: Secondary | ICD-10-CM | POA: Diagnosis not present

## 2017-10-03 DIAGNOSIS — I428 Other cardiomyopathies: Secondary | ICD-10-CM | POA: Insufficient documentation

## 2017-10-03 DIAGNOSIS — S299XXA Unspecified injury of thorax, initial encounter: Secondary | ICD-10-CM | POA: Diagnosis not present

## 2017-10-03 DIAGNOSIS — I2510 Atherosclerotic heart disease of native coronary artery without angina pectoris: Secondary | ICD-10-CM | POA: Diagnosis not present

## 2017-10-03 DIAGNOSIS — R531 Weakness: Secondary | ICD-10-CM | POA: Diagnosis not present

## 2017-10-03 DIAGNOSIS — Z7982 Long term (current) use of aspirin: Secondary | ICD-10-CM | POA: Insufficient documentation

## 2017-10-03 LAB — CBC
HEMATOCRIT: 35.9 % — AB (ref 39.0–52.0)
Hemoglobin: 11.6 g/dL — ABNORMAL LOW (ref 13.0–17.0)
MCH: 26.9 pg (ref 26.0–34.0)
MCHC: 32.3 g/dL (ref 30.0–36.0)
MCV: 83.1 fL (ref 78.0–100.0)
Platelets: 276 10*3/uL (ref 150–400)
RBC: 4.32 MIL/uL (ref 4.22–5.81)
RDW: 15.6 % — AB (ref 11.5–15.5)
WBC: 6 10*3/uL (ref 4.0–10.5)

## 2017-10-03 LAB — BASIC METABOLIC PANEL
Anion gap: 12 (ref 5–15)
BUN: 17 mg/dL (ref 6–20)
CHLORIDE: 101 mmol/L (ref 101–111)
CO2: 23 mmol/L (ref 22–32)
Calcium: 8.6 mg/dL — ABNORMAL LOW (ref 8.9–10.3)
Creatinine, Ser: 1.03 mg/dL (ref 0.61–1.24)
GFR calc Af Amer: >60 mL/min (ref >60)
GFR calc non Af Amer: >60 mL/min (ref >60)
GLUCOSE: 159 mg/dL — AB (ref 65–99)
Potassium: 4 mmol/L (ref 3.5–5.1)
Sodium: 136 mmol/L (ref 135–145)

## 2017-10-03 LAB — URINALYSIS, ROUTINE W REFLEX MICROSCOPIC
BILIRUBIN URINE: NEGATIVE
GLUCOSE, UA: NEGATIVE mg/dL
Hgb urine dipstick: NEGATIVE
Ketones, ur: NEGATIVE mg/dL
LEUKOCYTES UA: NEGATIVE
Nitrite: NEGATIVE
PH: 5 (ref 5.0–8.0)
Protein, ur: NEGATIVE mg/dL
SPECIFIC GRAVITY, URINE: 1.017 (ref 1.005–1.030)

## 2017-10-03 MED ORDER — SODIUM CHLORIDE 0.9 % IV BOLUS (SEPSIS)
500.0000 mL | Freq: Once | INTRAVENOUS | Status: AC
  Administered 2017-10-03: 500 mL via INTRAVENOUS

## 2017-10-03 MED ORDER — SODIUM CHLORIDE 0.9 % IV BOLUS (SEPSIS)
1000.0000 mL | Freq: Once | INTRAVENOUS | Status: AC
  Administered 2017-10-03: 1000 mL via INTRAVENOUS

## 2017-10-03 NOTE — ED Provider Notes (Signed)
[name] Provider Note   CSN: [id number] Arrival date & time: 10/03/17  1919     History   Chief Complaint Chief Complaint  Patient presents with  . Loss of Consciousness    HPI Julian Zimmerman is a 71 y.o. male.  Patient c/o syncopal event at home. Hx postural hypotension. Patient has used bathroom, gotten up to walk from room, felt faint, laid on floor, spouse tried to sit/stand up, and he briefly passed out.  Patient states feels fine now. Pt denies any associated chest pain or discomfort. No palpitations. No sob.  States had eaten/drank earlier today, ?less than normal due to current tx for oral abscess - has been on augmentin x 3 days and has follow up at dental/oral surgery clinic tomorrow. Denies fever or chills. Denies injury from fall. No headache. No neck or back pain.    The history is provided by the patient.  Loss of Consciousness   Pertinent negatives include abdominal pain, back pain, chest pain, confusion, fever, headaches and palpitations.    Past Medical History:  Diagnosis Date  . Arthritis    "right leg" (03/23/2014)  . Chronic systolic congestive heart failure ([place])   . Gout    "right leg" (03/23/2014)  . HTN (hypertension)   . Oral cancer ([place])   . Tobacco abuse     Patient Active Problem List   Diagnosis Date Noted  . Syncope 04/11/2017  . Orthostasis 04/11/2017  . Chronic systolic CHF (congestive heart failure) ([place]) 04/11/2017  . Hyponatremia 04/11/2017  . Normocytic anemia 04/11/2017  . Hemoptysis 02/08/2017  . Peripheral neuropathy due to chemotherapy ([place]) 02/08/2017  . Abnormality of gait and mobility 02/08/2017  . 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 Cigarette smoker 07/01/2016  . Non-ischemic cardiomyopathy ([place]) 03/28/2014  . Impaired fasting glucose 03/26/2014  . Abnormal CT scan of lung 03/26/2014  . Microscopic hematuria 03/26/2014  . Alcohol  abuse 03/24/2014  . Protein-calorie malnutrition, severe ([place]) 03/24/2014  . Squamous cell carcinoma of floor of mouth ([place]) 03/23/2014  . SOB (shortness of breath) on exertion 03/23/2014  . Acute exacerbation of chronic obstructive pulmonary disease (COPD) ([place]) 03/23/2014  . CAD (coronary artery disease) 03/23/2014    Past Surgical History:  Procedure Laterality Date  . ESOPHAGOGASTRODUODENOSCOPY N/A 12/26/2014   Procedure: ESOPHAGOGASTRODUODENOSCOPY (EGD);  Surgeon: Arta Silence, MD;  Location: [facility];  Service: Endoscopy;  Laterality: N/A;  . FLOOR OF MOUTH BIOPSY  03/2014   "found cancer"  . FOREIGN BODY REMOVAL N/A 10/12/2014   Procedure: FOREIGN BODY REMOVAL;  Surgeon: Jeryl Columbia, MD;  Location: [facility] ENDOSCOPY;  Service: Endoscopy;  Laterality: N/A;  . FOREIGN BODY REMOVAL N/A 12/26/2014   Procedure: FOREIGN BODY REMOVAL;  Surgeon: Arta Silence, MD;  Location: [facility] ENDOSCOPY;  Service: Endoscopy;  Laterality: N/A;  . FRACTIONAL FLOW RESERVE WIRE  03/27/2014   Procedure: FRACTIONAL FLOW RESERVE WIRE;  Surgeon: Jettie Booze, MD;  Location: [facility] CATH LAB;  Service: Cardiovascular;;  . LEFT AND RIGHT HEART CATHETERIZATION WITH CORONARY ANGIOGRAM N/A 03/27/2014   Procedure: LEFT AND RIGHT HEART CATHETERIZATION WITH CORONARY ANGIOGRAM;  Surgeon: Jettie Booze, MD;  Location: [facility] CATH LAB;  Service: Cardiovascular;  Laterality: N/A;  . [place] Medications    Prior to Admission medications   Medication Sig Start Date End Date Taking? Authorizing Provider  aspirin EC 81  MG EC tablet Take 1 tablet (81 mg total) by mouth daily. 03/28/14   Lucious Groves, DO  Cholecalciferol (VITAMIN D3) 1000 units CAPS Take 2,000 Int'l Units by mouth.    [provider]  DULoxetine (CYMBALTA) 30 MG capsule Take 1 capsule (30 mg total) by mouth daily. 09/06/17   Narda Amber K, DO  metoprolol succinate (TOPROL-XL) 50 MG 24 hr tablet Take 50 mg  by mouth daily. 12/10/14   [provider]  SPIRIVA HANDIHALER 18 MCG inhalation capsule PLACE 1 CAPSULE INTO INHALER AND INHALE DAILY 07/13/17   Noralee Space, MD  tiotropium (SPIRIVA HANDIHALER) 18 MCG inhalation capsule Place 1 capsule (18 mcg total) into inhaler and inhale daily. 12/18/16   Noralee Space, MD  vitamin B-12 (CYANOCOBALAMIN) 1000 MCG tablet Take 1,000 mcg by mouth daily.    [provider]    Family History Family History  Problem Relation Age of Onset  . Diabetes Mother   . Diabetes Father     Social History Social History   Tobacco Use  . Smoking status: Current Every Day Smoker    Packs/day: 1.00    Years: 50.00    Pack years: 50.00    Types: Cigarettes  . Smokeless tobacco: Never Used  Substance Use Topics  . Alcohol use: Yes    Alcohol/week: 16.8 oz    Types: 28 Shots of liquor per week    Comment: 03/23/2014 "4, 1oz shots/day"  . Drug use: No     Allergies   Patient has no known allergies.   Review of Systems Review of Systems  Constitutional: Negative for fever.  HENT: Negative for sore throat.   Eyes: Negative for redness.  Respiratory: Negative for shortness of breath.   Cardiovascular: Positive for syncope. Negative for chest pain, palpitations and leg swelling.  Gastrointestinal: Negative for abdominal pain.  Genitourinary: Negative for flank pain.  Musculoskeletal: Negative for back pain and neck pain.  Skin: Negative for rash.  Neurological: Positive for syncope. Negative for headaches.  Hematological: Does not bruise/bleed easily.  Psychiatric/Behavioral: Negative for confusion.     Physical Exam Updated Vital Signs BP 138/68   Pulse 73   Temp 97.6 F (36.4 C) (Oral)   Resp 14   Ht 1.829 m (6')   Wt 71.2 kg (157 lb)   SpO2 99%   BMI 21.29 kg/m   Physical Exam  Constitutional: He is oriented to person, place, and time. He appears well-developed and well-nourished. No distress.  HENT:  Head: Atraumatic.    Mouth/Throat: Oropharynx is clear and moist.  Absent teeth. Right lower mandible w mild swelling.   Eyes: Conjunctivae are normal. Pupils are equal, round, and reactive to light.  Neck: Neck supple. No tracheal deviation present. No thyromegaly present.  No bruits.   Cardiovascular: Normal rate, regular rhythm and intact distal pulses. Exam reveals no gallop and no friction rub.  No murmur heard. Pulmonary/Chest: Effort normal and breath sounds normal. No accessory muscle usage. No respiratory distress.  Abdominal: Soft. Bowel sounds are normal. He exhibits no distension. There is no tenderness.  Genitourinary:  Genitourinary Comments: No cva tenderness  Musculoskeletal: He exhibits no edema.  CTLS spine, non tender, aligned, no step off. Good rom bil ext without pain or focal bony tenderness.   Neurological: He is alert and oriented to person, place, and time.  Speech clear/fluent. Motor intact bil, stre 5/5.   Skin: Skin is warm and dry. He is not diaphoretic.  Psychiatric:  He has a normal mood and affect.  Nursing note and vitals reviewed.    ED Treatments / Results  Labs (all labs ordered are listed, but only abnormal results are displayed) Results for orders placed or performed during the hospital encounter of 04/10/17  Blood Culture (routine x 2)  Result Value Ref Range   Specimen Description BLOOD LEFT ARM    Special Requests      BOTTLES DRAWN AEROBIC AND ANAEROBIC Blood Culture adequate volume   Culture NO GROWTH 5 DAYS    Report Status 04/16/2017 FINAL   Blood Culture (routine x 2)  Result Value Ref Range   Specimen Description BLOOD RIGHT HAND    Special Requests      BOTTLES DRAWN AEROBIC AND ANAEROBIC Blood Culture adequate volume   Culture NO GROWTH 5 DAYS    Report Status 04/16/2017 FINAL   Urine culture  Result Value Ref Range   Specimen Description URINE, RANDOM    Special Requests NONE    Culture NO GROWTH    Report Status 04/12/2017 FINAL   CBC with  Differential/Platelet  Result Value Ref Range   WBC 14.1 (H) 4.0 - 10.5 K/uL   RBC 4.26 4.22 - 5.81 MIL/uL   Hemoglobin 11.3 (L) 13.0 - 17.0 g/dL   HCT 35.5 (L) 39.0 - 52.0 %   MCV 83.3 78.0 - 100.0 fL   MCH 26.5 26.0 - 34.0 pg   MCHC 31.8 30.0 - 36.0 g/dL   RDW 15.2 11.5 - 15.5 %   Platelets 218 150 - 400 K/uL   Neutrophils Relative % 85 %   Neutro Abs 12.1 (H) 1.7 - 7.7 K/uL   Lymphocytes Relative 5 %   Lymphs Abs 0.7 0.7 - 4.0 K/uL   Monocytes Relative 10 %   Monocytes Absolute 1.4 (H) 0.1 - 1.0 K/uL   Eosinophils Relative 0 %   Eosinophils Absolute 0.0 0.0 - 0.7 K/uL   Basophils Relative 0 %   Basophils Absolute 0.0 0.0 - 0.1 K/uL  Basic metabolic panel  Result Value Ref Range   Sodium 134 (L) 135 - 145 mmol/L   Potassium 4.1 3.5 - 5.1 mmol/L   Chloride 104 101 - 111 mmol/L   CO2 20 (L) 22 - 32 mmol/L   Glucose, Bld 122 (H) 65 - 99 mg/dL   BUN 16 6 - 20 mg/dL   Creatinine, Ser 0.98 0.61 - 1.24 mg/dL   Calcium 8.7 (L) 8.9 - 10.3 mg/dL   GFR calc non Af Amer >60 >60 mL/min   GFR calc Af Amer >60 >60 mL/min   Anion gap 10 5 - 15  Troponin I  Result Value Ref Range   Troponin I <0.03 <0.03 ng/mL  Urinalysis, Routine w reflex microscopic  Result Value Ref Range   Color, Urine YELLOW YELLOW   APPearance CLEAR CLEAR   Specific Gravity, Urine 1.020 1.005 - 1.030   pH 5.0 5.0 - 8.0   Glucose, UA NEGATIVE NEGATIVE mg/dL   Hgb urine dipstick NEGATIVE NEGATIVE   Bilirubin Urine NEGATIVE NEGATIVE   Ketones, ur NEGATIVE NEGATIVE mg/dL   Protein, ur 30 (A) NEGATIVE mg/dL   Nitrite NEGATIVE NEGATIVE   Leukocytes, UA NEGATIVE NEGATIVE   RBC / HPF 0-5 0 - 5 RBC/hpf   WBC, UA 0-5 0 - 5 WBC/hpf   Bacteria, UA NONE SEEN NONE SEEN   Squamous Epithelial / LPF 0-5 (A) NONE SEEN   Mucus PRESENT    Hyaline Casts, UA PRESENT  Ethanol  Result Value Ref Range   Alcohol, Ethyl (B) <5 <5 mg/dL  CBC  Result Value Ref Range   WBC 11.3 (H) 4.0 - 10.5 K/uL   RBC 3.83 (L) 4.22 - 5.81  MIL/uL   Hemoglobin 10.1 (L) 13.0 - 17.0 g/dL   HCT 31.8 (L) 39.0 - 52.0 %   MCV 83.0 78.0 - 100.0 fL   MCH 26.4 26.0 - 34.0 pg   MCHC 31.8 30.0 - 36.0 g/dL   RDW 15.3 11.5 - 15.5 %   Platelets 177 150 - 400 K/uL  Basic metabolic panel  Result Value Ref Range   Sodium 135 135 - 145 mmol/L   Potassium 4.2 3.5 - 5.1 mmol/L   Chloride 108 101 - 111 mmol/L   CO2 19 (L) 22 - 32 mmol/L   Glucose, Bld 123 (H) 65 - 99 mg/dL   BUN 17 6 - 20 mg/dL   Creatinine, Ser 0.93 0.61 - 1.24 mg/dL   Calcium 8.0 (L) 8.9 - 10.3 mg/dL   GFR calc non Af Amer >60 >60 mL/min   GFR calc Af Amer >60 >60 mL/min   Anion gap 8 5 - 15  Troponin I  Result Value Ref Range   Troponin I <0.03 <0.03 ng/mL  CBC with Differential/Platelet  Result Value Ref Range   WBC 7.3 4.0 - 10.5 K/uL   RBC 4.25 4.22 - 5.81 MIL/uL   Hemoglobin 11.7 (L) 13.0 - 17.0 g/dL   HCT 36.0 (L) 39.0 - 52.0 %   MCV 84.7 78.0 - 100.0 fL   MCH 27.5 26.0 - 34.0 pg   MCHC 32.5 30.0 - 36.0 g/dL   RDW 15.7 (H) 11.5 - 15.5 %   Platelets 192 150 - 400 K/uL   Neutrophils Relative % 68 %   Neutro Abs 5.0 1.7 - 7.7 K/uL   Lymphocytes Relative 19 %   Lymphs Abs 1.4 0.7 - 4.0 K/uL   Monocytes Relative 12 %   Monocytes Absolute 0.8 0.1 - 1.0 K/uL   Eosinophils Relative 1 %   Eosinophils Absolute 0.1 0.0 - 0.7 K/uL   Basophils Relative 0 %   Basophils Absolute 0.0 0.0 - 0.1 K/uL  Comprehensive metabolic panel  Result Value Ref Range   Sodium 136 135 - 145 mmol/L   Potassium 4.0 3.5 - 5.1 mmol/L   Chloride 109 101 - 111 mmol/L   CO2 18 (L) 22 - 32 mmol/L   Glucose, Bld 97 65 - 99 mg/dL   BUN 10 6 - 20 mg/dL   Creatinine, Ser 0.85 0.61 - 1.24 mg/dL   Calcium 8.4 (L) 8.9 - 10.3 mg/dL   Total Protein 6.9 6.5 - 8.1 g/dL   Albumin 3.1 (L) 3.5 - 5.0 g/dL   AST 14 (L) 15 - 41 U/L   ALT 11 (L) 17 - 63 U/L   Alkaline Phosphatase 78 38 - 126 U/L   Total Bilirubin 0.6 0.3 - 1.2 mg/dL   GFR calc non Af Amer >60 >60 mL/min   GFR calc Af Amer >60  >60 mL/min   Anion gap 9 5 - 15  Magnesium  Result Value Ref Range   Magnesium 1.6 (L) 1.7 - 2.4 mg/dL  Phosphorus  Result Value Ref Range   Phosphorus 3.0 2.5 - 4.6 mg/dL  I-Stat CG4 Lactic Acid, ED  (not at  [facility])  Result Value Ref Range   Lactic Acid, Venous 0.80 0.5 - 1.9 mmol/L  ECHOCARDIOGRAM COMPLETE  Result Value  Ref Range   Weight 2,568 oz   Height 72 in   BP 143/73 mmHg    EKG  EKG Interpretation  Date/Time:  Sunday October 03 2017 19:22:54 EST Ventricular Rate:  73 PR Interval:    QRS Duration: 109 QT Interval:  404 QTC Calculation: 446 R Axis:   34 Text Interpretation:  Sinus rhythm No significant change since last tracing Confirmed by Lajean Saver [phone]) on 10/03/2017 7:31:54 PM       Radiology No results found.  Procedures Procedures (including critical care time)  Medications Ordered in ED Medications - No data to display   Initial Impression / Assessment and Plan / ED Course  I have reviewed the triage vital signs and the nursing notes.  Pertinent labs & imaging results that were available during my care of the patient were reviewed by me and considered in my medical decision making (see chart for details).  Iv ns bolus. Labs.  Ecg/monitor.   Reviewed nursing notes and prior charts for additional history.   Recheck vitals x multiple - bp normal/sl elevated. Orthostatic vitals fine on recheck.   Pt tolerating po.  No faintness or dizziness. ?vasovagal episode earlier.   Pt has f/u tomorrow w onc/dental. Afebrile in ED. No current c/o.  Patient appears stable for d/c.     Final Clinical Impressions(s) / ED Diagnoses   Final diagnoses:  None    ED Discharge Orders    None       Lajean Saver, MD 10/03/17 2300

## 2017-10-03 NOTE — ED Triage Notes (Signed)
Pt brought to ED by [facility] from home after having one Syncope episode while using the bathroom, pt got dizzy, very diaphoretic an pass out for few second, LSW today at 1845 episode happened around 1900. Pt denies any cp, n/v/d, no fever or chills. AO x 4 no neuro deficit noticed on triage. Hx of CA, COPD, CHF.  VSP:  BP 138/82 HR 80 SPO2 99% RA CBG 198

## 2017-10-03 NOTE — Discharge Instructions (Signed)
It was our pleasure to provide your ER care today - we hope that you feel better.  Rest. Drink plenty of fluids.  Follow up tomorrow as planned.  Return to ER if worse, new symptoms, high fevers, weak/fainting, chest pain, trouble breathing, other concern.

## 2017-10-04 DIAGNOSIS — Y842 Radiological procedure and radiotherapy as the cause of abnormal reaction of the patient, or of later complication, without mention of misadventure at the time of the procedure: Secondary | ICD-10-CM | POA: Diagnosis not present

## 2017-10-04 DIAGNOSIS — Z6822 Body mass index (BMI) 22.0-22.9, adult: Secondary | ICD-10-CM | POA: Diagnosis not present

## 2017-10-04 DIAGNOSIS — C049 Malignant neoplasm of floor of mouth, unspecified: Secondary | ICD-10-CM | POA: Diagnosis not present

## 2017-10-04 DIAGNOSIS — M8730 Other secondary osteonecrosis, unspecified bone: Secondary | ICD-10-CM | POA: Diagnosis not present

## 2017-10-04 LAB — CBG MONITORING, ED: Glucose-Capillary: 160 mg/dL — ABNORMAL HIGH (ref 65–99)

## 2017-10-21 ENCOUNTER — Encounter: Payer: Self-pay | Admitting: Pulmonary Disease

## 2017-10-21 ENCOUNTER — Ambulatory Visit (INDEPENDENT_AMBULATORY_CARE_PROVIDER_SITE_OTHER): Payer: Medicare Other | Admitting: Pulmonary Disease

## 2017-10-21 ENCOUNTER — Telehealth: Payer: Self-pay | Admitting: Pulmonary Disease

## 2017-10-21 VITALS — BP 118/62 | HR 80 | Temp 97.2°F | Ht 72.0 in | Wt 160.6 lb

## 2017-10-21 DIAGNOSIS — C049 Malignant neoplasm of floor of mouth, unspecified: Secondary | ICD-10-CM | POA: Diagnosis not present

## 2017-10-21 DIAGNOSIS — M8708 Idiopathic aseptic necrosis of bone, other site: Secondary | ICD-10-CM

## 2017-10-21 DIAGNOSIS — R918 Other nonspecific abnormal finding of lung field: Secondary | ICD-10-CM | POA: Diagnosis not present

## 2017-10-21 DIAGNOSIS — G620 Drug-induced polyneuropathy: Secondary | ICD-10-CM | POA: Diagnosis not present

## 2017-10-21 DIAGNOSIS — R269 Unspecified abnormalities of gait and mobility: Secondary | ICD-10-CM

## 2017-10-21 DIAGNOSIS — J449 Chronic obstructive pulmonary disease, unspecified: Secondary | ICD-10-CM | POA: Diagnosis not present

## 2017-10-21 DIAGNOSIS — F17210 Nicotine dependence, cigarettes, uncomplicated: Secondary | ICD-10-CM | POA: Diagnosis not present

## 2017-10-21 DIAGNOSIS — M879 Osteonecrosis, unspecified: Secondary | ICD-10-CM

## 2017-10-21 DIAGNOSIS — I428 Other cardiomyopathies: Secondary | ICD-10-CM

## 2017-10-21 DIAGNOSIS — I2510 Atherosclerotic heart disease of native coronary artery without angina pectoris: Secondary | ICD-10-CM

## 2017-10-21 DIAGNOSIS — T451X5A Adverse effect of antineoplastic and immunosuppressive drugs, initial encounter: Secondary | ICD-10-CM

## 2017-10-21 MED ORDER — IPRATROPIUM-ALBUTEROL 0.5-2.5 (3) MG/3ML IN SOLN: 3.0000 mL | Freq: Two times a day (BID) | RESPIRATORY_TRACT | 2 refills | Status: DC

## 2017-10-21 MED ORDER — BUDESONIDE 0.25 MG/2ML IN SUSP: 0.2500 mg | Freq: Two times a day (BID) | RESPIRATORY_TRACT | 12 refills | Status: DC

## 2017-10-21 MED ORDER — IPRATROPIUM-ALBUTEROL 0.5-2.5 (3) MG/3ML IN SOLN: 3.0000 mL | Freq: Two times a day (BID) | RESPIRATORY_TRACT | 3 refills | Status: DC

## 2017-10-21 MED ORDER — LEVALBUTEROL HCL 0.63 MG/3ML IN NEBU
0.6300 mg | INHALATION_SOLUTION | Freq: Once | RESPIRATORY_TRACT | Status: AC
  Administered 2017-10-21: 0.63 mg via RESPIRATORY_TRACT

## 2017-10-21 NOTE — Telephone Encounter (Signed)
Called [facility] who stated they were missing the DX code on pt's neb sol. I gave them the code to cover the pt's dx. Nothing further needed at this time.

## 2017-10-21 NOTE — Progress Notes (Signed)
Subjective:     Patient ID: Julian Zimmerman, male   DOB: 07/25/1946, 72 y.o.   MRN: [id number]  HPI   ~  March 25, 2014:  In-patient pulmonary consultation by KC>   REFERRING PHYSICIAN:  Internal Medicine Teaching Service. HISTORY OF PRESENT ILLNESS:  The patient is a very pleasant 72 year old gentleman with very little prior medical care, who I have been asked to see for an abnormal CT chest.  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  He presented to the emergency room with progressive dyspnea on exertion, and a CT angio was done in order to rule out a pulmonary embolus.  This did not show a PET, but did show borderline lymphadenopathy, a small ground-glass opacity in the left lower lobe, pleural thickening with nodularity, right-sided pleural plaques primarily, medially, and finally large low attenuation masses in the pleural space and fissures bilaterally.  The patient has a long history of smoking, and probably has COPD.  He is being treated for a COPD exacerbation.  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  The patient denies any exposure to tuberculosis, but has never had a PPD placed.  He did, however, live in [place] for many years, but never in the [place].  The patient has very little cough and no congestion, but occasionally produces scant purulent mucus.  He has not had excessive weight loss. PAST MEDICAL HISTORY:  Significant for gout as well as arthritis.  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 FAMILY HISTORY:  Significant for diabetes only. SOCIAL HISTORY:  He is married and has worked for [employer], and [employer] as well as [employer].  He  has a history of smoking 1 pack per day for 52 years, but has not smoked since the beginning of the month. REVIEW OF SYSTEMS:  A 10-point review of systems was negative except for that mentioned in history of present illness.  PHYSICAL EXAMINATION:  GENERAL:  He is a well-developed male, in no acute distress. VITAL SIGNS:  Blood pressure 132/86, pulse is 100, respiratory rate 18. He is afebrile.  Oxygen saturation on room air is 92%-94%. HEENT:  Pupils equal, round, reactive to light and accommodation. Extraocular muscles are intact.  Nares are patent without discharge. Oropharynx is clear. NECK:  Without lymphadenopathy or thyromegaly. CHEST:  Very diminished breath sounds throughout, but no true wheezing. There was prominent upper airway pseudo wheezing that resolved with pursed lip breathing. CARDIAC:  Distant heart sounds but regular, 2/6 systolic murmur. ABDOMEN:  Soft, nontender, nondistended.  Good bowel sounds. GENITAL EXAM, RECTAL EXAM< BREAST EXAM:  Not done and not indicated. EXTREMITIES:  Lower extremities with edema noted, no calf tenderness and no cyanosis.  NEUROLOGICAL:  He is alert and oriented and moves all 4 extremities without deficits.  IMPRESSION: 1. Abnormal CT chest with low attenuation masses in the pleural space     bilaterally and especially in the fissures.  It is unclear whether     this is simply loculated fluid, or whether this is an inflammatory     pleural process.  I would find it very unlikely this is a     malignancy,  but certainly cannot exclude.  The patient has had very     little medical care over his life, and therefore no serial chest x-     rays for comparison.  The patient did have a chest x-ray in 2005     that did not showed these abnormalities.  The patient also has a     ground glass opacity in the left lower lobes that may be     inflammatory, but this may be an early malignancy.  He really does     not have significant mediastinal  adenopathy.  It is very difficult     to put all of this together, but the patient does have     granulomatous changes in his liver and spleen, and had lived in     [place] for many years of his life.  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  I would find this to be a very rare presentation     of sarcoidosis, however, nothing can be excluded at this point.  In     terms of planning, I would support following through with a PET     scan, and see where things stand.  At some point he may need     aspiration of one of these low attenuation masses that are abutting the right chest wall.     The pleural process may be nothing to worry about, and just represent loculated fluid.     I suspect none of this is related to his dyspnea on exertion, which     is probably due to COPD. SUGGESTIONS: 1. would place a PPD for completeness => reported NEG 2. Agree with PET scanning => done at [facility]:  CT Chest 04/05/14 showed cardiomegaly, atherosclerotic calcif in Ao & coronaries, +mediastinal adenopathy w/ largest 1.8cm in right lower paratrach region (favored to be reactive), marked centrilob & mild paraseptal emphysema in upper lobes, diffuse bilat GGOs, focal areas of consolidation bilat, mult loculated effusions along the major fissures bilat & along the right minor fissure (pseudotumors), pleural calcif are seen on the right;  Mult punctate calcif in liver & spleen c/w prior granulomatous dis, multilevel DJD in spine...  PET scan 04/05/14 showed intensely hypermetabolic mass involving the right floor of the mouth extending across the midline, mod to intesne FDG avid LNs concerning for mets;  Mild FDG avid mediastinal adenopathy favored to be reactive;;  No FDG avid pulm nodules, loculated pleural effusions, Abd & bones were neg...    ~  April 12, 2014:  Follow up OV by KC>    The patient comes in today after his recent  hospitalization for a COPD exacerbation.  He was discharged on Spiriva alone with Albuterol for rescue, and has done extremely well since that time. He feels that his breathing is adequate, and he rarely requires his rescue inhaler. He denies any significant cough or mucus production. The patient also has a history of a cardiomyopathy, with chronic congestive heart failure and lower extremity edema. He also has a history of an abnormal CT chest with loculated effusions and pleural calcifications. A recent PET scan that [facility] showed no abnormal uptake within his chest. He is getting ready to start chemotherapy for head and neck cancer next week in [facility]. IMP>>  1) Dyspnea:  The patient has significant dyspnea on exertion that I  suspect is related to COPD.  However, he has not had pulmonary function studies, and we will need these for evaluation. He is currently on Spiriva alone, and feels that he is doing extremely well. I will therefore continue him on this regimen. At some point will refer him to pulmonary rehabilitation, but he is getting ready to start chemotherapy for his head and neck cancer next week. 2) Abn CT Chest:  The patient has multiple abnormal findings on CT chest, but his recent PET scan shows no abnormal uptake within the chest. It is unclear whether his abnormal findings are related to asbestos exposure or possibly old granulomatous disease. The patient also has chronic congestive heart failure, and perhaps these are chronically loculated effusions. ADDENDUM>>  Outpt Full PFTs done 05/04/14:  FVC=3.70 (92%), FEV1=2.72 (89%), %1sec=73, mid-flows reduced at 72% predicted; Post bronchodil FEV1 did NOT improve;  TLC=5.94 (82%);  DLCO=39% & DL/VA=49%... He was asked to ret in 3-4 months but he did not do so-- all medical follow up was from [facility]  ENT, Oncology, RAD-ONC, and Cardiology...    ~  July 01, 2016:  43moROV w/ SN>  FSamajpresents self-referred because "I want to  stop the Spiriva" - this is his whole agenda for the visit, he is here w/ his wife who helps w/ the history >   I have spent several hours pouring over his Epic record from 2015 and [facility] the [facility]>>    Squamous cell Ca floor of the mouth>  Initial eval in [place] by DBarbaraann Faster [facility](not felt to be a surg candidate due to severe CHF/ cardiomyopathy) w/ ChemoRx including carbo/taxol/cetux starting in 04/2014, then cetux+XRT from 9/29 - 08/27/14 for total 70Gy;  He is followed regularly (Q667moby ENT-DrWeissler & XRT-DrChera last seen 04/23/16 & no evid of recurrent or metastatic dis found on f/u CT scan;  Thyroid function tests checked periodically & remain wnl as well;  Last seen by DrWeissler 01/13/16-- 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     Chemotherapy induced peripheral neuropathy>  [facility] in [facility] and feet reported to be improved w/ Cymbalta60/d and Neurontin300Tid (prev on 900Bid but he weaned it on his own) => they signed off 7 indicated that his PCP was taking over this issue...     COPD- mixed chr bronchitis & emphysema (seen on prev CT Chest)>      Cigarette smoker>  He quit transiently during his Chemoradiation but then restarted & currently smoking betw 1/2-1ppd;  He started smoking in teens, has smoked >50 yrs up to 2ppd w/ an est ~80 pack-yr smoking hx overall;  He [facility] a [facility] smoking cessation program but was unable to quit;  They continue CT Chest Q6m98mor follow up evals...    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 as seen on prev CT scans    HBP, CAD> (atherosclerotic changes seen in ao & coronaries on CT Chest), Cath 03/2014 by DrVaranasi showed 50% midLAD stenosis, small Circ w/ 60% stenosis in large ramus branch, mild dis in dominant RCA=> placed on aggressive medical therapy & [facility] on ASA81, MetoprololER50, Lasix20-just taken prn edema; NOTE: he is very sens to Lisinopril w/ hypotension x2 in past;     Non-ischemic cardiomyopathy>  w/ EF=20-25% on 2DEcho 03/2014 assoc w/ mildMR, modTR, PAsys=72mHg;  His initial BNP was >23000;  [facility] w/ medical management (per 2DEcho 12/2014)...    Hyperlipidemia> on Atorva40>  ?last FLP 04/06/14 [facility] TChol 164, [facility]     Hx esophageal food impactions requiring EGD & endoscopic food removal by DrOutlaw 1/16 & 3/16 (believed due to eating large food bolus w/o chewing due to his dental issues, no underlying esoph pathology described); dietary adjustments and dental work recommended    Hx alcohol abuse & prot-cal malnutrition>  He has cut back drinking Etoh but still drinks-- serum proteins/ LFTs/ Alb are all wnl now...    Dental problems>  He has dry mouth & uses Biotene...    Anemia>  His last CBC was Jan2016 w/ Hg= 10.3, renal function is wnl    Pt has declined Flu shots> states allergic w/ rash after Flu shotn yrs ago... EXAM shows Afeb, VSS, O2sat=100% on RA at rest; Wt=165#, 6"Tall, BMI=22.4;  HEENT- teeth poor, scarring floor of mouth, indurated neck tissue;  Chest- decr BS bilat, clear w/o w/r/r;  Heart- RR w/o m/r/g;  Abd- soft, nontender, neg;  Ext- VI, no c/c/e;  Neuro- +neuropathy in hands/ feet...   CT Chest w/ [facility]16 [facility]>  Normal heart size, scat coronary calcif, unchanged pericard calcif, no pericardial fluid;  Stable emphysema w/ biapical scarring, unchanged pleural calcif, no new pulm nodules, no adenopathy;  Scattered hepatic & splenic granulomas;  DDD, no suspicious osseous lesions...   CXR 07/01/16>  Norm heart size, min blunting of angles bilat, clear lungs w/ biapical pleural thickening, NAD; prev bilat pseudotumors have resolved...   Spirometry 07/01/16>  FVC=3.55 (86%), FEV1=2.00 (64%), %1sec=56%,  mid-flows are reduced at 22% predicted... This represents a 25% drop in his FEV1 over the last 243yrdespite his Spiriva Rx...  Ambulatory Oximetry 07/01/16>  O2sat=99% on RA at rest w/ pulse=73/min;  He ambulated 3 Laps (185' each) w/ lowest O2sat=97% w/ pulse 86/min, no desaturations...  IMP/PLAN>>  FrJosph Machoeems annoyed that his pulm function has deteriorated over the last several yrs- despite his continued smoking, the interval treatment for his SCCa of the mouth, etc; this has occurred despite the Spiriva daily during that interval; I told him that he desperately needs to quit all smoking AND w/ need to add an ICS/LABA to his regimen- continue Spiriva daily & add BREO- one inhalation daily; he can still use an Albut rescue inhaler as needed; recall that his FullPFTs in 2015 showed a severe reduction in DLCO c/w emphysema... His whole objective in obtaining this pulmonary f/u visit was to be able to STOP his Spiriva rx and we have instead reinforced the need to quit smoking & added the [facility] not [facility] w/ these recommendations...    ~  Feb 08, 2017:  7-60m40moV and urgent add-on appt requested by pt's wife for hemoptysis>  As above FreJosph Machos not too pleased that his continued smoking over the last few yrs (despite Dx & Rx for Stage 4 SCCa floor or mouth) lead to worsening COPD & the need for MORE meds (Breo added to his Spiriva/ AlbutHFA);  Not surprisingly he did not take the Breo, continued smoking, and did not follow up w/ us [place] requested; Note: he continued the Spiriva as before;  Pt's wife brings him in today when she heard him coughing & found a tissue w/  blood present=> when pressed for more info & the truth pt indicates that he's had hemoptysis for at least a few weeks- notes cough, mostly dry w/o sputum, occurs day & night, denies SOB or CP, occas notes streaky BRB but he is quite sure there is no sput, denies f/c/s, wife notes some congestion/ rattling/ wheezing but he denies, wife saw blood for  the 1st & only time earlier today, needless to say he has not taken anything extra for this problem...     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  Review of Care Everywhere records reveals:     He saw NP-Lewis 07/22/16 for [facility]> HBP, CAD (50% mid-LAD on cath 03/2014), CHF w/ most recent 2DEcho (12/2014) showing EF=50-55% on Metop & Lasix; intol to Lisinopril w/ low BPs; rec to ret in 182yr.    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 & 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 (pt denied hearing problems; he developed osteoradionecrosis of the mandible & is getting hyperbaric oxygen therapy w/ plans to remove the dead bone thereafter by dental medicine- exam w/ bilat exposed bone worse on the left, no evid recurrent cancer;  Latest CT Chest 10/26/16 showed norm heart size, atherosclerotic changes in Ao & coronaries, unchanged pericardial calcif as well, stable biapical scarring & emphysema, no new pulm nodules, unchqnged right pleural calcif, no  adenopathy, bilat gynecomastia, scattered hepatic & splenic granulomas, degen changes in spine;  Latest labs 10/26/16 w/ TSH=4.11 & FreeT4=0.79...  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     Chemotherapy induced peripheral neuropathy>  [facility] reported to be improved w/ Cymbalta60/d and Neurontin300Tid (prev on 900Bid but he weaned it on his own) => they signed off 7 indicated that his PCP was taking over this issue...Marland KitchenMarland Kitchen  COPD- mixed chr bronchitis & emphysema (seen on prev CT Chest)>      Cigarette smoker>  He quit transiently during his Chemoradiation but then restarted & currently smoking betw 1/2-1ppd;  He started smoking in teens, has smoked >50 yrs up to 2ppd w/ an est ~80 pack-yr smoking hx overall;  He went through a [facility] smoking cessation program but was unable to quit;  They continue CT Chest Q3mofor follow up evals...    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 as seen on prev CT scans    HBP, CAD> (atherosclerotic changes seen in ao & coronaries on CT Chest), Cath 03/2014 by DrVaranasi showed 50% midLAD stenosis, small Circ w/ 60% stenosis in large ramus branch, mild dis in dominant RCA=> placed on aggressive medical therapy [facility] last seen 01/20/16> on ASA81, MetoprololER50, Lasix20-just taken prn edema; NOTE: he is very sens to Lisinopril  w/ hypotension x2 in past;     Non-ischemic cardiomyopathy> w/ EF=20-25% on 2DEcho 03/2014 assoc w/ mildMR, modTR, PAsys=538mg;  His initial BNP was >23000;  [facility]-55% w/ medical management (per 2DEcho 12/2014)...    Hyperlipidemia> on Atorva40>  ?last FLP 04/06/14 [facility] TChol 164, [facility]     Hx esophageal food impactions requiring EGD & endoscopic food removal by DrOutlaw 1/16 & 3/16 (believed due to eating large food bolus w/o chewing due to his dental issues, no underlying esoph pathology described); dietary adjustments and dental work recommended    Hx alcohol abuse & prot-cal malnutrition>  He has cut back drinking Etoh but still drinks-- serum proteins/ LFTs/ Alb are all wnl now...    Dental problems>  He has dry mouth & uses Biotene...    HxAnemia>  His last CBC was Jan2016 w/ Hg= 10.3, renal function is wnl    Pt has declined Flu shots> states allergic w/ rash after Flu shotn yrs ago... EXAM shows Afeb, VSS, O2sat=100% on RA at rest; Wt=161#, 6"Tall, BMI=22;  HEENT- edentulous & exposed mandib bone bilat, scarring floor of mouth, indurated neck tissue;  Chest- decr BS bilat, clear w/o w/r/r;  Heart- RR w/o m/r/g;  Abd- soft, nontender, neg;  Ext- VI, no c/c/e;  Neuro- +neuropathy in hands/ feet...   CXR 02/08/17 (independently reviewed by me in the PACS system) showed norm heart size, essentially clear lungs w/ sl blunt angles bilat- NAD...  CT Angiogram Chest 02/09/17 showed norm heart size, atherosclerotic changes in Ao & coronaries; NEG for PE; scat mediastinal & hilar LNs- no masses, no change from prior CT 03/2014; biapical pleuroparenchymal scarring & emphysematous changes- no acute pulm process identified (no worrisome lesions or interstitial process); stable dense right sided pleural calcif (?old infection or trauma?); scat calcif granulomas in liver & spleen...  Ambulatory Oximetry 02/08/17 on RA>  O2sat=100% on RA at rest  w/ pulse=90/min.  He ambulated 2 laps in office w/ lowest O2sat=99% w/ max pulse=99/min (stopped due to difficulty walking & balance issues)...  LABS 02/08/17>  Chems- wnl x BS=123, Cr=1.06, LFTs=wnl;  CBC- ok w/ Hg=12.9, mcv=83, WBC=4.1;  TSH=5.91 (s/p XRT, not on meds);  Sed=99;  BNP=26;  PT/PTT- wnl... IMP/PLAN>>  We discussed immediate therapy w/ LEVAQUIN500Qd, MEDROL Dosepak, Quit all smoking, Hold ASA, Add the Breo one inhalation daily to the Spiriva daily;  OK to resume Hyperbaric treatments, and we plan ROV recheck in 2 wks...  NOTE:  >50% of this  60 min rov was spent in counseling & coordination of care...  ~  Feb 17, 2017:  2wk ROV & pulmonary recheck> Josph Macho has completed his Levaquin & Medrol dosepak and reports a good response w/ resolution of the hemoptysis & appetite improving; he notes persistent cough, small amt congestion but no sput or hemoptysis now; similarly he denies SOB/ CP/ f/c/s/ etc;  Overall his breathing is better he says however he continues to smoke ~1ppd despite all efforts to get him to quit;  Wife notes that Breo costs $126 per month & Spiriva is $90/mo now that they have met all deductible expenses- we discussed getting a copy of their Prescription Drug Formulary to aid [place] in selecting the least expensive/ most effective medication available, and I reminded them that 1ppd at $5 per pack is ~$150/month in cigarettes!      I again reviewed the Care Everywhere notes from [place] pt's wife indicates that he had some dental surg (?debridement ?bone graft?) in April & now the hyperbaric oxygen treatments are in anticipation of his being set up w/ dentures but there are NO [place] NOTES IN EPIC>>  His PCP is Eloise Levels, NP at [facility] but they are not sure of his last visit there... 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     Chemotherapy induced peripheral neuropathy>  [facility] symptoms in hands and feet reported to be improved w/ Cymbalta and Neurontin...    Osteonecrosis of mandible>  Eval by ENT- DrWeissler, XRT- DrChera, & [facility], ?bone grafting, & referred for Hyperbaric oxygen=> currently in middle of 50 treatment program...    COPD- mixed chr bronchitis & emphysema (seen on prev CT Chest)>  Spirometry 06/2016 w/ mod airflow obstruction & deterioration in his FEV1 over the prev 246yr placed on BREO & Spiriva, advised to quit all smoking!    Cigarette smoker>  He quit transiently during his Chemoradiation but then restarted & currently smoking ~1ppd;  He started smoking in teens, has smoked >50 yrs up to 2ppd w/ an est ~80+ pack-yr smoking hx overall;  He went through a [facility] smoking cessation program but was unable to quit;  They continue CT Chest Q6m59mor follow up evals...    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 as seen on prev CT scans    HBP, CAD> (atherosclerotic changes seen in ao & coronaries on CT Chest), Cath 03/2014 by DrVaranasi showed 50% midLAD stenosis, small Circ w/ 60% stenosis in large ramus branch, mild dis in dominant RCA=> placed on aggressive medical therapy & [facility]> on ASA81, MetoprololER50, Lasix20-just taken prn edema; NOTE: he is very sens to Lisinopril w/ hypotension x2 in past;     Non-ischemic cardiomyopathy> w/ EF=20-25% on 2DEcho 03/2014 assoc w/ mildMR, modTR, PAsys=27m54m  His initial BNP was  >23000;  Follow up studies on therapy  at [facility] showed improvement in EF to 50-55% w/ medical management (per 2DEcho 12/2014)...    Hyperlipidemia> on Atorva40>  ?last FLP 04/06/14 showed TChol 164, HDL 61 at [facility]...     Hx esophageal food impactions requiring EGD & endoscopic food removal by DrOutlaw 1/16 & 3/16 (believed due to eating large food bolus w/o chewing due to his dental issues, no underlying esoph pathology described); dietary adjustments and dental work recommended    Hx alcohol abuse & prot-cal malnutrition>  He has cut back drinking Etoh but still drinks-- serum proteins/ LFTs/ Alb are all wnl now...    Dental problems>  He has dry mouth & uses Biotene...    HxAnemia>  His last CBC was Jan2016 w/ Hg= 10.3, renal function is wnl    Pt has declined Flu shots/ vaccinations> states allergic w/ rash after Flu shot yrs ago; reminded to consult w/ his PCP about indicated vaccinations...Marland KitchenMarland KitchenMarland Kitchen EXAM shows Afeb, VSS, O2sat=99% on RA at rest; Wt=158#, 6"Tall, BMI=22;  HEENT- edentulous & exposed mandib bone bilat, scarring floor of mouth, indurated neck tissue;  Chest- decr BS bilat, clear w/o w/r/r;  Heart- RR w/o m/r/g;  Abd- soft, nontender, neg;  Ext- VI, no c/c/e;  Neuro- +neuropathy in hands/ feet...  IMP/PLAN>>  [name]'s congestion & hemoptysis have resolved w/ Levaquin & Medrol- now states breathing is at his baseline on Breo & Spiriva daily;  He MUST quit smoking (still at 1ppd) esp to save $$ and afford his meds;  They will get copy of his formulary & we can adjust once we see his coverage; we plan routine recheck in 69mo..  ~  April 20, 2017:  247moOV & [name] stating "I slipped and fell, they don't know what happened"; DCSummary reviewed- presented w/ syncope, he was orthostatic, no arrhythmias detected, 2DEcho showed EF improved to 50-55%, no wall motion abn, no DD;  He was given IVF & improved (NOTE: has hx of extreme sensitivity to Lisiinopril w/ hypotension in past); no change  in respiratory meds; he received PT/OT=> disch w/ [facility] follow up by NP-Lewis & 14d Event Monitor applied...     From the pulmonary standpoint- FrJosph Machondicates that he is breathing OK, min cough, small amt whitish sput, no blood or discoloration, ans denies SOB/ CP/ edema;  He has difficulty ambulating due to his neuropathy- (followed by Neuro & he saw DrPatel in June2018 w/ EMG/NCV done);  He is getting PT/OT at home now trying to incr his mobility but he is unable to exert himself;  NOTE: wife indicates that he likes to sleep on the couch, snores loudly, & is restless & talks in his sleep;  For his part- pt states he rests well, wakes refreshed and denies daytime hypersomnolence BUT will nap 7/7 days and sometimes falls asleep watching TV in eve => we reviewed indication for a Sleep Study & we will set this up for him...    We reviewed his medical history & problem list as above... EXAM shows Afeb, VSS, O2sat=98% on RA at rest; Wt=158#, 6"Tall, BMI=22;  HEENT- edentulous & exposed mandib bone bilat, scarring floor of mouth, indurated neck tissue;  Chest- decr BS bilat, clear w/o w/r/r;  Heart- RR w/o m/r/g;  Abd- soft, nontender, neg;  Ext- VI, no c/c/e;  Neuro- +neuropathy in hands/ feet...   CT Head & Cspine 05/11/17>  IMPRESSION: No evidence of traumatic intracranial or Cspine injury or fracture; Mild cortical volume loss and scattered small  vessel ischemic microangiopathy; Mild degenerative change noted along the cervical spine; Emphysema at the lung apices; Calcification at the carotid bifurcations bilaterally; Near complete opacification of the mastoid air cells bilaterally...  CXR 04/11/17> Borderline heart size, atherosclerosis of the thoracic aorta, hyperinflation and emphysema- no acute abnormality.  2DEcho 04/12/17>  LVF at low end of norm w/ EF=50-55%, no regional wall motion abn, norm diastolic parameters, AoV sclerosis w/o stenosis, norm MV, norm LA&RA, RV was norm & PA pressure could not be  accurately estimated  LABS reviewed>  Chems- ok w/ Cr=0.85, Alb=3.1;  CBC- ok w/ Hg=11.7;  Troponins- neg... IMP/PLAN>>  Josph Macho was adm w/ syncope- he was orthostatic & improved w/ IVF, no arrhythmias found, [facility] has applied a 14d event monitor for completeness; He is very sensitive to ACE inhibitors and should not receive these meds;  From the pulmonary standpoint he says his breathing is stable- asked to STOP ALL SMOKING, take the [place] once daily, gradually increase exercise program;  When asked wife notes that he snores some & is occas restless at night, pt feels that he rests well, and we discussed a screening HOME SLEEP TEST to r/o OSA...   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    ~  October 21, 2017:  18moROV & FJosph Machoreturns without complaints or concerns he says-- still smoking 1ppd, says his breathing is OK, notes sl cough, small amt whitis sput, no hemoptysis;  While he denies SOB w/ activity- his wife indicates that he is SOB while showering, and if he tries to help her some around the house- then he needs to lie down & it's been this way for a couple of yrs...  He also denies CP, palpit, edema...  We reviewed the following interval medical reports in Epic & CareEverywhere>      [facility]iss[facility] on [facility]12/17>  Hx SCCa floor of the mouth w/ pos LNs 2015 & not a surg cand due to severe cardiomyopathy w/ EF=20-25%; s/p chemoradiation w/ resultant osteonecrosis of the mandible- dental med treated him w/ hyperbaric oxygen, on Peridex rinses and observation status;     He saw [facility]- JLewis,NP on 07/22/17>  SOB, CHF, HBP, CAD, smoking- on same meds x wife stopped  his Lipitor due to fatigue; must quit smoking, meds kept the same, f/u 144yr.     He saw NEURO- DrPatel on 09/06/18>  F/u chemo-induced neuropathy, ulnar neuropathy, & tremors; hx alcoholism (quit 2015), no DM, neg fam hx;  He tapered off his Gabapentin & was supposed to be on Cymbalta60 but it wasn't on his med list;  She rec reducing Cymbalta to 3031m to see if he needs it; Quit smoking, stayoff Etoh, f/u 86yr2yr   [facility]>  Hx postural hypotension & had syncopal spell at home; no CP/ palpit/ SOB/ leg discomfort x neuropathy;  CXR- NAD;  EKG- NSR, min NSSTTWA;  Labs- Chems- wnl, BS=159, Cr=1.03; CBC- ok w/ Hg=11.6 (mcv=83), WBC=6K; UA- clear;  Given IV NS bolus, monitor was all NSR, no orthostatic changes on recheck & disch  home... 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    Chemotherapy induced peripheral neuropathy>  Followed by Oncology division at [facility]-- symptoms in hands and feet reported to be improved w/ Cymbalta and Neurontin...    Osteonecrosis of mandible>  Eval by ENT- DrWeissler, XRT- DrChera, & Dental Medicine at [facility] w/ surgical debridement, ?bone grafting, & referred for Hyperbaric oxygen=> completed 50 treatment program...    COPD- mixed chr bronchitis & emphysema (seen on prev CT Chest)>  Spirometry 06/2016 w/ mod airflow obstruction & deterioration in his FEV1  over the prev 27yr; placed on BREO & Spiriva, advised to quit all smoking!  He c/o cost of inhalers (still smoking 1ppd) & we switched to NEBULIZER w/ Duoneb Bid & Budesonide 0.25Bid...    Cigarette smoker>  He quit transiently during his Chemoradiation but then restarted & currently smoking ~1ppd;  He started smoking in teens, has smoked >50 yrs up to 2ppd w/ an est ~80+ pack-yr smoking hx overall;  He went through a [facility] smoking cessation program but was unable to quit;  They continue CT Chest Q697moor follow up evals...    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 as seen on prev CT scans    HBP, CAD> (atherosclerotic changes seen in ao & coronaries on CT Chest), Cath 03/2014 by DrVaranasi showed 50% midLAD stenosis, small Circ w/ 60% stenosis in large ramus branch, mild dis in dominant RCA=> placed on aggressive medical therapy & [facility] seen 07[facility]> on ASA81, MetoprololER50, Lasix20-just taken prn edema; NOTE: he is very sens to Lisinopril w/ hypotension x2 in past;     Non-ischemic cardiomyopathy> w/ EF=20-25% on 2DEcho 03/2014 assoc w/ mildMR, modTR, PAsys=5232m;  His initial BNP was >23000;  [facility]-55% w/ medical management (per 2DEcho 12/2014)...    Hyperlipidemia> prev on Atorva40>  Wife weaned him off the statin due to fatigue, ?last FL[facility] 04[facility]06/14 [facility] 164[facility]     Hx esophageal food impactions requiring EGD & endoscopic food removal by DrOutlaw 1/16 & 3/16 (believed due to eating large food bolus w/o chewing due to his dental issues, no underlying esoph pathology described); dietary adjustments and dental work recommended    Hx alcohol abuse & prot-cal malnutrition>  He has cut back drinking Etoh but still drinks-- serum proteins/ LFTs/ Alb are all wnl...    Dental problems>  He has dry mouth & osteoradionecrosis of the mandible, uses Biotene...    HxAnemia>  prev CBC w/ Hg= 10.3, then 11.3, then  11.7;  renal function is wnl EXAM shows Afeb, VSS, O2sat=94% on RA at rest; Wt=161#, 6"Tall, BMI=22-3;  HEENT- edentulous & exposed mandib bone bilat, scarring floor of mouth, indurated neck tissue;  Chest- decr BS bilat, clear w/o w/r/r;  Heart- RR w/o m/r/g;  Abd-  soft, nontender, neg;  Ext- VI, no c/c/e;  Neuro- +neuropathy in hands/ feet...   CXR 10/03/17 (ER w/ syncope)>  Norm heart size, apical pleural thicening bilat, clear lungs- NAD  LABS 10/03/17 in ER>  Chems- wnl, BS=159, Cr=1.03; CBC- ok w/ Hg=11.6 (mcv=83), WBC=6K; UA- clear IMP/PLAN>>  Josph Macho continues to smoke, despite everything he's been thru;  He complains that the inhalers are too expensive, (in donut hole esp) making compliance poor- REC change to NEBULIZER w/ DUONEB Bid & BUDESONIDE 0.25 Bid;  He needs to maintain close f/u w/ his ENT Team at [facility] regarding his prev SCCa of mouth floor but also due to complications of chemotherapy induced peripheral neuropathy, and osteonecrosis of mandible;  We will plan pulmonary recheck in 60mo..  ~  ADDENDUM>>  Low-dose screening CT Chest done 10/26/17 at [facility]>>  IMPRESSION:  Normal heart size. Coronary artery and aortic calcifications. No adenopathy. No suspicious bone lesions are seen. COPD/ Emphysema. Biapical pulmonary parenchymal opacities are likely postradiation changes. Calcified pleural plaques and pleural thickening in the right hemithorax may be sequela of prior empyema or hemothorax. 2 mm solid subpleural right middle lobe nodule on image 35, and left upper lobe 3 mm solid pulmonary nodule on image 24, are unchanged.    Past Medical History:  Diagnosis Date   COPD/ emphysema >> on BREO & Spiriva daily    Tobacco abuse >> he continues to smoke 1ppd & has an ~80+ pack-yr hx   . Arthritis    "right leg" (03/23/2014)   Gout    HBP >> on ToprolXL50, Lasix20    CAD >> nonobstructive CAD on Cath 2015   . Non-ischemic cardiomyopathy >> EF improved from 20-25% in 2015 to 50-55% 12/2014 on  Rx   . Hyperlipidemia >> on Lipitor40   . [place] Squamous cell ca of the ant floor of the mouth         s/p Chemotherapy, [facility]    Chemotherapy induced peripheral neuropathy >> on Gabapentin/ Cymbalta per DrPatel    Radiation therapy induced osteonecrosis of the mandible >> [facility](getting [facility]=> surg debridement planned     Past Surgical History:  Procedure Laterality Date  . ESOPHAGOGASTRODUODENOSCOPY N/A 12/26/2014   Procedure: ESOPHAGOGASTRODUODENOSCOPY (EGD);  Surgeon: WArta Silence MD;  [facility];  Service: Endoscopy;  Laterality: N/A;  . FLOOR OF MOUTH BIOPSY  03/2014   "found cancer"  . FOREIGN BODY REMOVAL N/A 10/12/2014   Procedure: FOREIGN BODY REMOVAL;  Surgeon: MJeryl Columbia MD;  Location: [facility] ENDOSCOPY;  Service: Endoscopy;  Laterality: N/A;  . FOREIGN BODY REMOVAL N/A 12/26/2014   Procedure: FOREIGN BODY REMOVAL;  Surgeon: WArta Silence MD;  Location: [facility] ENDOSCOPY;  Service: Endoscopy;  Laterality: N/A;  . FRACTIONAL FLOW RESERVE WIRE  03/27/2014   Procedure: FRACTIONAL FLOW RESERVE WIRE;  Surgeon: JJettie Booze MD;  [facility];  Service: Cardiovascular;;  . LEFT AND RIGHT HEART CATHETERIZATION WITH CORONARY ANGIOGRAM N/A 03/27/2014   Procedure: LEFT AND RIGHT HEART CATHETERIZATION WITH CORONARY ANGIOGRAM;  Surgeon: JJettie Booze MD;  [facility];  Service: Cardiovascular;  Laterality: N/A;  . [place]   Outpatient Encounter Medications as of 10/21/2017  Medication Sig  . aspirin EC 81 MG EC tablet Take 1 tablet (81 mg total) by mouth daily.  . Cholecalciferol (VITAMIN D3) 1000 units CAPS  Take 2,000 Units by mouth daily.   . DULoxetine (CYMBALTA) 30 MG capsule Take 1 capsule (30 mg total) by mouth daily.  . metoprolol succinate (TOPROL-XL) 50 MG 24 hr tablet Take 50 mg by mouth daily.  .  [DISCONTINUED] amoxicillin-clavulanate (AUGMENTIN) 875-125 MG tablet Take 1 tablet by mouth 2 (two) times daily. For 10 days  . budesonide (PULMICORT) 0.25 MG/2ML nebulizer solution Take 2 mLs (0.25 mg total) by nebulization 2 (two) times daily.  [name] ipratropium-albuterol (DUONEB) 0.5-2.5 (3) MG/3ML SOLN Take 3 mLs by nebulization 2 (two) times daily.  . [DISCONTINUED] budesonide (PULMICORT) 0.25 MG/2ML nebulizer solution Take 2 mLs (0.25 mg total) by nebulization 2 (two) times daily.  . [DISCONTINUED] ipratropium-albuterol (DUONEB) 0.5-2.5 (3) MG/3ML SOLN Take 3 mLs by nebulization 2 (two) times daily.  . [DISCONTINUED] SPIRIVA HANDIHALER 18 MCG inhalation capsule PLACE 1 CAPSULE INTO INHALER AND INHALE DAILY (Patient not taking: Reported on 10/21/2017)  . [EXPIRED] levalbuterol (XOPENEX) nebulizer solution 0.63 mg    No facility-administered encounter medications on file as of 10/21/2017.     No Known Allergies-- HE IS VERY SENSITIVE TO ACE INHIBITORS AND SHOULD NOT RECEIVE THESE MEDS.   Immunization History  Administered Date(s) Administered  . Influenza,inj,Quad PF,6+ Mos 07/22/2017  . PPD Test 03/26/2014  . Tdap 04/11/2017    Current Medications, Allergies, Past Medical History, Past Surgical History, Family History, and Social History were reviewed in [employer] record.   Review of Systems             All symptoms NEG except where BOLDED >>  Constitutional:  F/C/S, fatigue, anorexia, unexpected weight change. HEENT:  HA, visual changes, hearing loss, earache, nasal symptoms, sore throat, mouth sores, hoarseness. Resp:  cough, sputum, hemoptysis; SOB, tightness, wheezing. Cardio:  CP, palpit, DOE, orthopnea, edema. GI:  N/V/D/C, blood in stool; reflux, abd pain, distention, gas. GU:  dysuria, freq, urgency, hematuria, flank pain, voiding difficulty. MS:  joint pain, swelling, tenderness, decr ROM; neck pain, back pain, etc. Neuro:  HA, tremors, seizures,  dizziness, syncope, weakness, numbness, gait abn. Skin:  suspicious lesions or skin rash. Heme:  adenopathy, bruising, bleeding. Psyche:  confusion, agitation, sleep disturbance, hallucinations, anxiety, depression suicidal.   Objective:   Physical Exam       Vital Signs:  Reviewed...   General:  WD,Thin, 72 y/o BM chr ill appearing, in NAD; alert & oriented; pleasant & cooperative, somewhat anxious... HEENT:  [place]/AT; Conjunctiva- pink, Sclera- nonicteric, EOM-wnl, PERRLA, EACs-wax, TMs-obscured; NOSE-clear; THROAT-clear, no lesions seen. Neck:  Supple w/ fair ROM; no JVD; normal carotid impulses w/o bruits; no thyromegaly or nodules palpated; no lymphadenopathy; indurated thickened tissue Chest:  Decr BS bilat without wheezes, rales, or rhonchi heard. Heart:  Regular Rhythm; norm S1 & S2 without murmurs, rubs, or gallops detected. Abdomen:  Soft & nontender- no guarding or rebound; normal bowel sounds; no organomegaly or masses palpated, sm umbil hernia. Ext:  Abn posture & unable to flex fingers 345 left hand w/ resting tremor; otherw fair ROM, +arthritic changes, +ven insuffic/ dermatitis, no edema... Neuro:  CNs II-XII intact; motor testing normal; +periph neuropathy, balance off... Derm:  No lesions noted; no rash etc. Lymph:  No cervical, supraclavicular, axillary, or inguinal adenopathy palpated.   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     2)  Chemotherapy induced peripheral neuropathy>  [facility] feet reported to be improved w/ Cymbalta60/d and Neurontin300Tid (prev on 900Bid but he weaned it on his own) => they signed off 7 indicated that his PCP was taking over this issue..Marland KitchenMarland Kitchen    3) Radiation therapy induced osteonecrosis of the mandible>  [facility] to be followed by debridement surg etc...    4) COPD- mixed chr bronchitis & emphysema (seen on prev CT Chest)>      5) Cigarette smoker>  He quit transiently during his Chemoradiation but then restarted & currently smoking betw 1/2-1ppd;  He started smoking in teens, has smoked >50 yrs up to 2ppd w/ an est ~80 pack-yr smoking hx overall;  He went through a [facility] smoking cessation program but was unable to quit;  They continue CT Chest Q619moor follow up evals...    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 as seen on prev CT scans    8) HBP, CAD> (atherosclerotic changes seen in ao & coronaries on CT Chest), Cath 03/2014 by DrVaranasi showed 50% midLAD stenosis, small Circ w/ 60% stenosis in large ramus branch, mild dis in dominant RCA=> placed on aggressive medical therapy & [facility] seen 01/20/16> on ASA81, MetoprololER50, Lasix20-just taken prn edema; NOTE: he is very sens to Lisinopril w/ hypotension x2 in past;     9) Non-ischemic cardiomyopathy> w/ EF=20-25% on 2DEcho 03/2014 assoc w/ mildMR, modTR, PAsys=5222m;  His initial BNP was >23000;  [facility]-55% w/ medical management (per 2DEcho 12/2014)...    10) Hyperlipidemia> on Atorva40>  ?last FLP 04/06/14 [facility]Chol 164[facility]     11) Hx esophageal food impactions requiring EGD & endoscopic food removal by  DrOutlaw 1/16 & 3/16 (believed due to eating large food bolus w/o chewing due to his dental issues, no underlying esoph pathology described); dietary adjustments and dental work recommended    12) Hx alcohol abuse & prot-cal malnutrition>  He has cut back drinking Etoh but still drinks-- serum proteins/ LFTs/ Alb are all wnl now...    13) Dental problems>  He has dry mouth & uses Biotene...    14) HxAnemia>  His last CBC was Jan2016 w/ Hg= 10.3, renal function is wnl, and   PLAN >>  07/01/16>   FreJosph Machoems annoyed that his pulm function has deteriorated over the last several yrs- despite his continued smoking, the interval treatment for his SCCa of the mouth, etc; this has occurred despite the Spiriva daily during that interval; I told him that he desperately needs to quit all smoking AND w/ need to add an ICS/LABA to his regimen- continue Spiriva daily & add BREO- one inhalation daily; he can still use  an Albut rescue inhaler as needed; recall that his FullPFTs in 2015 showed a severe reduction in DLCO c/w emphysema... His whole objective in obtaining this pulmonary f/u visit was to be able to STOP his Spiriva rx and we have instead reinforced the need to quit smoking & added the [facility], he was not pleased w/ these recommendations 02/08/17>   We discussed immediate therapy w/ [id number], MEDROL Dosepak, Quit all smoking, Hold ASA, Add the Breo one inhalation daily to the Spiriva daily;  OK to resume Hyperbaric treatments, and we plan ROV recheck in 2 wks.. 02/17/17>   [name]'s congestion & hemoptysis have resolved w/ Levaquin & Medrol- now states breathing is at his baseline on Breo & Spiriva daily;  He MUST quit smoking (still at 1ppd) esp to save $$ and afford his meds;  They will get copy of his formulary & we can adjust once we see his coverage; we plan routine recheck in 34mo 04/20/17>   FJosph Machowas adm w/ syncope- he was orthostatic & improved w/ IVF, no arrhythmias found, [facility] has applied a 14d event  monitor for completeness; He is very sensitive to ACE inhibitors and should not receive these meds;  From the pulmonary standpoint he says his breathing is stable- asked to STOP ALL SMOKING, take the [place] daily, gradually increase exercise program;  When asked wife notes that he snores some & is occas restless at night, pt feels that he rests well, and we discussed a screening HOME SLEEP TEST to r/o OSA. 10/21/17>   FJosph Machocontinues to smoke, despite everything he's been thru;  He complains that the inhalers are too expensive, (in donut hole esp) making compliance poor- REC change to NEBULIZER w/ DUONEB Bid & BUDESONIDE 0.25 Bid;  [facility] floor but also due to complications of chemotherapy induced peripheral neuropathy, and osteonecrosis of mandible;  We will plan pulmonary recheck in 6109mo    Plan:     Patient's Medications  New Prescriptions   BUDESONIDE (PULMICORT) 0.25 MG/2ML NEBULIZER SOLUTION    Take 2 mLs (0.25 mg total) by nebulization 2 (two) times daily.   IPRATROPIUM-ALBUTEROL (DUONEB) 0.5-2.5 (3) MG/3ML SOLN    Take 3 mLs by nebulization 2 (two) times daily.  Previous Medications   ASPIRIN EC 81 MG EC TABLET    Take 1 tablet (81 mg total) by mouth daily.   CHOLECALCIFEROL (VITAMIN D3) 1000 UNITS CAPS    Take 2,000 Units by mouth daily.    DULOXETINE (CYMBALTA) 30 MG CAPSULE    Take 1 capsule (30 mg total) by mouth daily.   METOPROLOL SUCCINATE (TOPROL-XL) 50 MG 24 HR TABLET    Take 50 mg by mouth daily.  Modified Medications   No medications on file  Discontinued Medications   AMOXICILLIN-CLAVULANATE (AUGMENTIN) 875-125 MG TABLET    Take 1 tablet by mouth 2 (two) times daily. For 10 days   SPIRIVA HANDIHALER 18 MCG INHALATION CAPSULE    PLACE 1 CAPSULE INTO INHALER AND INHALE DAILY

## 2017-10-21 NOTE — Patient Instructions (Signed)
Today we updated your med list in our EPIC system...  We will check the follow up CXR & CT scans done at [facility]...    We decided to change your expensive inhalers (Breo & Spiriva) to a NEBULIZER with 2 different meds>    We will arrange for a nebulizer for you...    Then start breathing treatments using DUONEB and BUDESONIDE-- do both of these via the nebulizer twice daily...  [name], you need to quit all the smoking!!!    Try the nicotine replacement options - lozenges, patches, etc (ALL ARE AVAIL OTC)    Try CHANTIX (I mean even Ray Liotta was able to quit with Chantix!    Consider hypnosis if necessary-- the end result (not smoking) justifies the means...  Call for any questions...  Let's plan a follow up visit in 45mo, sooner if needed for problems.Marland KitchenMarland Kitchen

## 2017-10-22 ENCOUNTER — Telehealth: Payer: Self-pay | Admitting: Pulmonary Disease

## 2017-10-22 NOTE — Telephone Encounter (Signed)
Spoke with pt's wife, Neoma Laming. Advised her that it's fine if she goes and picks up the nebulizer machine. Nothing further was needed.

## 2017-10-22 NOTE — Telephone Encounter (Signed)
She states she was told they would deliver the nebulizer machine and set this up and show them how to use this but they are telling her to pick this up.  She states she has no problem picking this up but wants to make sure this is okay because she states she was told multiple times they would bring this to the home and set up and show how this is to be used. Asking we call her back this am.

## 2017-10-26 DIAGNOSIS — Z6821 Body mass index (BMI) 21.0-21.9, adult: Secondary | ICD-10-CM | POA: Diagnosis not present

## 2017-10-26 DIAGNOSIS — Z87891 Personal history of nicotine dependence: Secondary | ICD-10-CM | POA: Diagnosis not present

## 2017-10-26 DIAGNOSIS — R197 Diarrhea, unspecified: Secondary | ICD-10-CM | POA: Diagnosis not present

## 2017-10-26 DIAGNOSIS — C049 Malignant neoplasm of floor of mouth, unspecified: Secondary | ICD-10-CM | POA: Diagnosis not present

## 2017-10-26 DIAGNOSIS — R1310 Dysphagia, unspecified: Secondary | ICD-10-CM | POA: Diagnosis not present

## 2017-10-26 DIAGNOSIS — G629 Polyneuropathy, unspecified: Secondary | ICD-10-CM | POA: Diagnosis not present

## 2017-10-26 DIAGNOSIS — I509 Heart failure, unspecified: Secondary | ICD-10-CM | POA: Diagnosis not present

## 2017-10-26 DIAGNOSIS — Z923 Personal history of irradiation: Secondary | ICD-10-CM | POA: Diagnosis not present

## 2017-10-26 DIAGNOSIS — N179 Acute kidney failure, unspecified: Secondary | ICD-10-CM | POA: Diagnosis not present

## 2017-11-12 DIAGNOSIS — L303 Infective dermatitis: Secondary | ICD-10-CM | POA: Diagnosis not present

## 2017-11-25 DIAGNOSIS — S83241A Other tear of medial meniscus, current injury, right knee, initial encounter: Secondary | ICD-10-CM | POA: Diagnosis not present

## 2017-11-25 DIAGNOSIS — M25561 Pain in right knee: Secondary | ICD-10-CM | POA: Diagnosis not present

## 2017-12-17 DIAGNOSIS — Z0000 Encounter for general adult medical examination without abnormal findings: Secondary | ICD-10-CM | POA: Diagnosis not present

## 2017-12-17 DIAGNOSIS — L0201 Cutaneous abscess of face: Secondary | ICD-10-CM | POA: Diagnosis not present

## 2017-12-17 DIAGNOSIS — C069 Malignant neoplasm of mouth, unspecified: Secondary | ICD-10-CM | POA: Diagnosis not present

## 2017-12-17 DIAGNOSIS — E440 Moderate protein-calorie malnutrition: Secondary | ICD-10-CM | POA: Diagnosis not present

## 2017-12-17 DIAGNOSIS — F17210 Nicotine dependence, cigarettes, uncomplicated: Secondary | ICD-10-CM | POA: Diagnosis not present

## 2017-12-17 DIAGNOSIS — Z1389 Encounter for screening for other disorder: Secondary | ICD-10-CM | POA: Diagnosis not present

## 2017-12-17 DIAGNOSIS — Z66 Do not resuscitate: Secondary | ICD-10-CM | POA: Diagnosis not present

## 2018-01-03 DIAGNOSIS — Z6820 Body mass index (BMI) 20.0-20.9, adult: Secondary | ICD-10-CM | POA: Diagnosis not present

## 2018-01-03 DIAGNOSIS — C049 Malignant neoplasm of floor of mouth, unspecified: Secondary | ICD-10-CM | POA: Diagnosis not present

## 2018-01-03 DIAGNOSIS — M8730 Other secondary osteonecrosis, unspecified bone: Secondary | ICD-10-CM | POA: Diagnosis not present

## 2018-01-03 DIAGNOSIS — Y842 Radiological procedure and radiotherapy as the cause of abnormal reaction of the patient, or of later complication, without mention of misadventure at the time of the procedure: Secondary | ICD-10-CM | POA: Diagnosis not present

## 2018-01-05 ENCOUNTER — Telehealth: Payer: Self-pay

## 2018-01-05 NOTE — Telephone Encounter (Signed)
SENT REFERRAL TO SCHEDULING 

## 2018-01-24 DIAGNOSIS — Y842 Radiological procedure and radiotherapy as the cause of abnormal reaction of the patient, or of later complication, without mention of misadventure at the time of the procedure: Secondary | ICD-10-CM | POA: Diagnosis not present

## 2018-01-24 DIAGNOSIS — M8730 Other secondary osteonecrosis, unspecified bone: Secondary | ICD-10-CM | POA: Diagnosis not present

## 2018-01-24 DIAGNOSIS — C049 Malignant neoplasm of floor of mouth, unspecified: Secondary | ICD-10-CM | POA: Diagnosis not present

## 2018-03-06 IMAGING — CT CT ANGIO CHEST
2 of 7 series · 18 of 46 positions shown · IV contrast (ISOVUE 370)
Comparison: Chest CT 03/23/2014

CLINICAL DATA: One week of hemoptysis.

EXAM:
CT ANGIOGRAPHY CHEST WITH CONTRAST
TECHNIQUE: Multidetector CT imaging of the chest was performed using the
standard protocol during bolus administration of intravenous
contrast. Multiplanar CT image reconstructions and MIPs were
obtained to evaluate the vascular anatomy.
CONTRAST:  80 cc Isovue 370

[Series 5: thins · axial · 0.69mm/px · z∈[-286,-15]mm · 15 of 297 slices shown]
[im 13/297  lung]
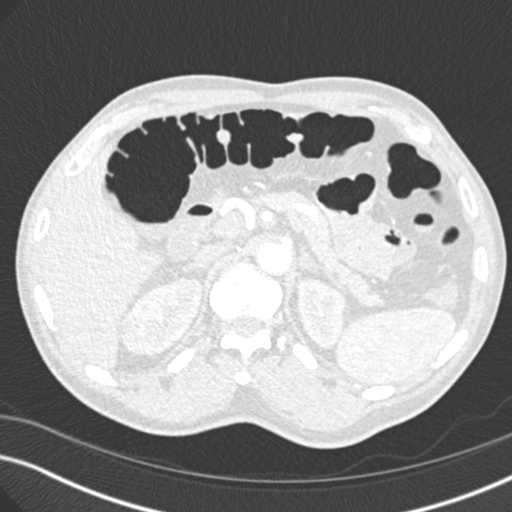
[im 39/297  soft-tissue]
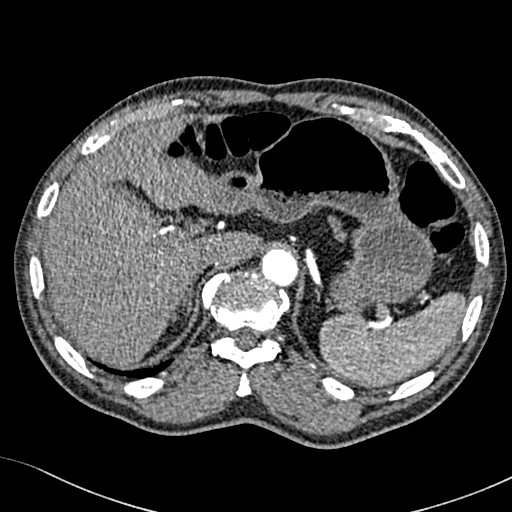
[im 52/297  lung]
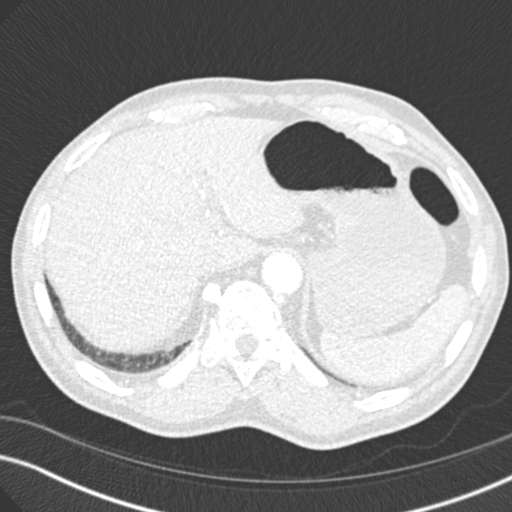
[im 78/297  soft-tissue]
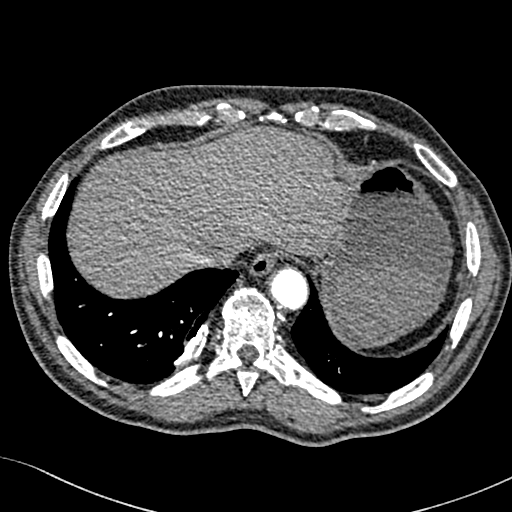
[im 91/297  lung]
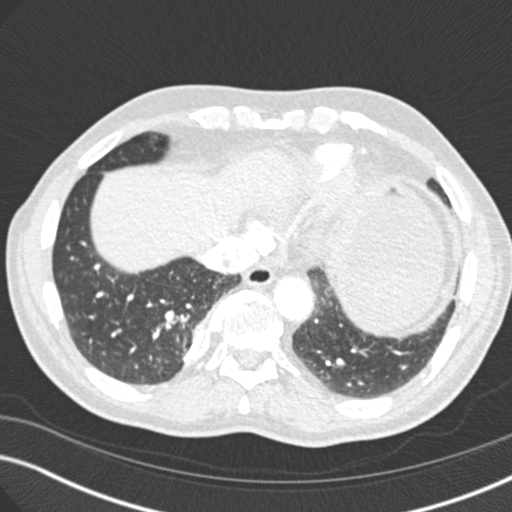
[im 116/297  soft-tissue]
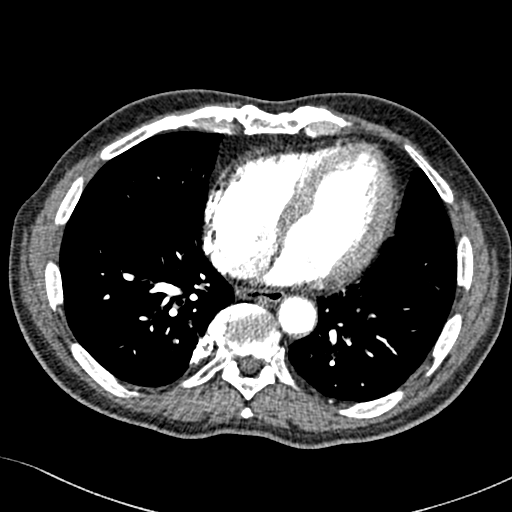
[im 129/297  lung]
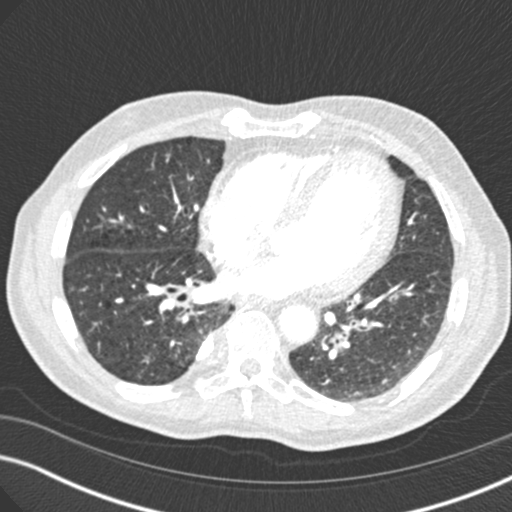
[im 155/297  soft-tissue]
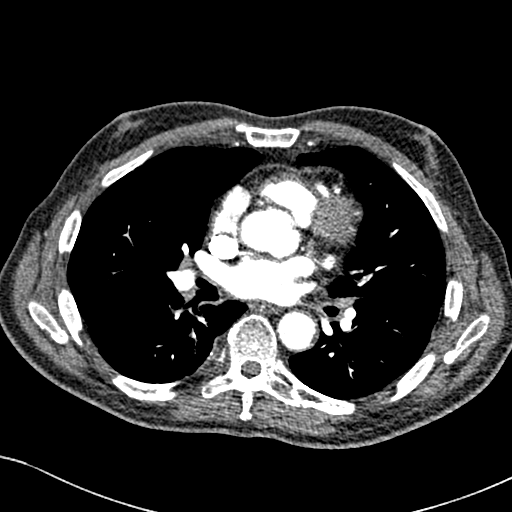
[im 168/297  lung]
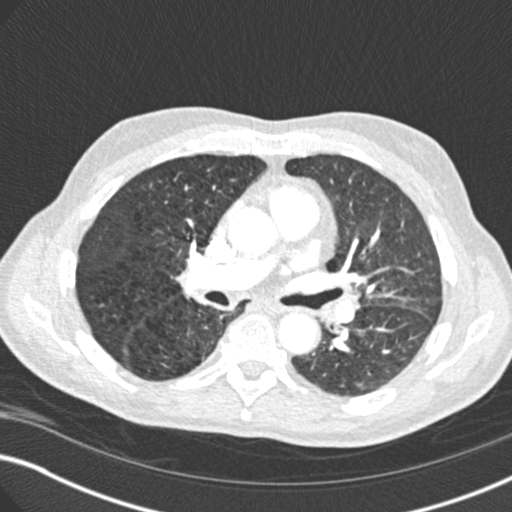
[im 181/297  soft-tissue]
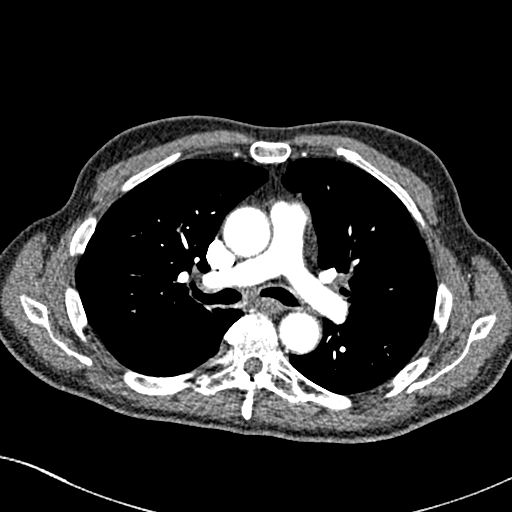
[im 206/297  lung]
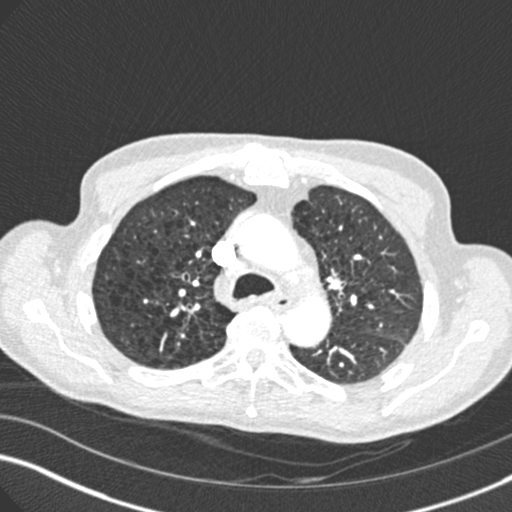
[im 219/297  soft-tissue]
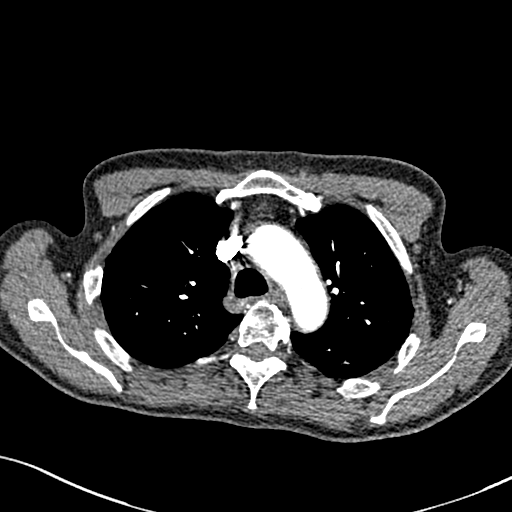
[im 245/297  lung]
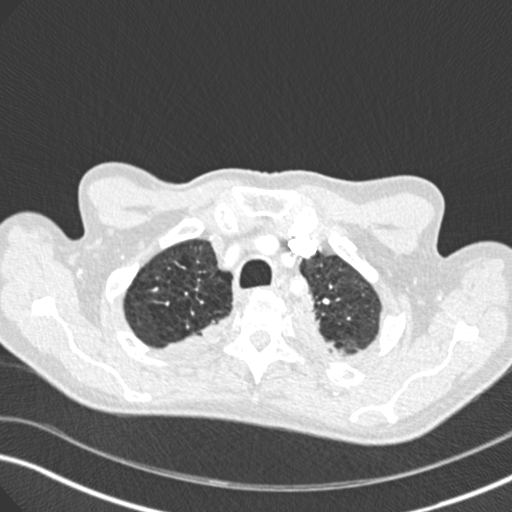
[im 258/297  soft-tissue]
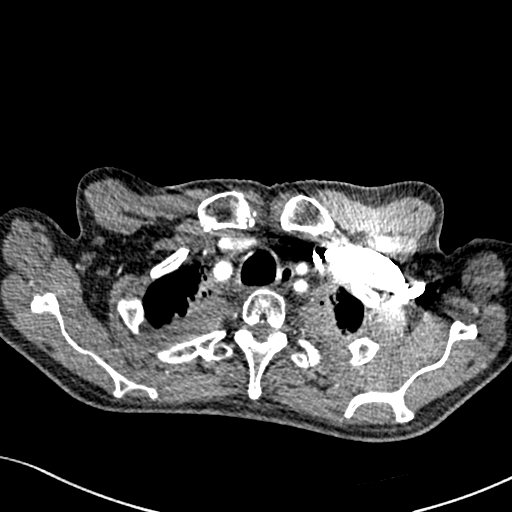
[im 284/297  lung]
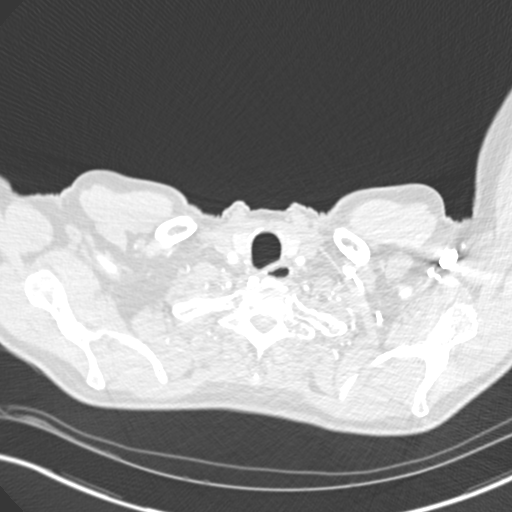

[Series 7: coronal mpr · coronal · 0.59mm/px · 3 of 113 slices shown]
[im 29/113  soft-tissue]
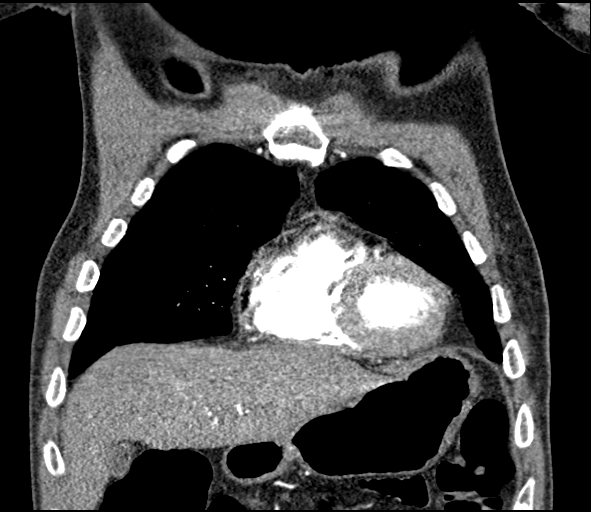
[im 57/113  soft-tissue]
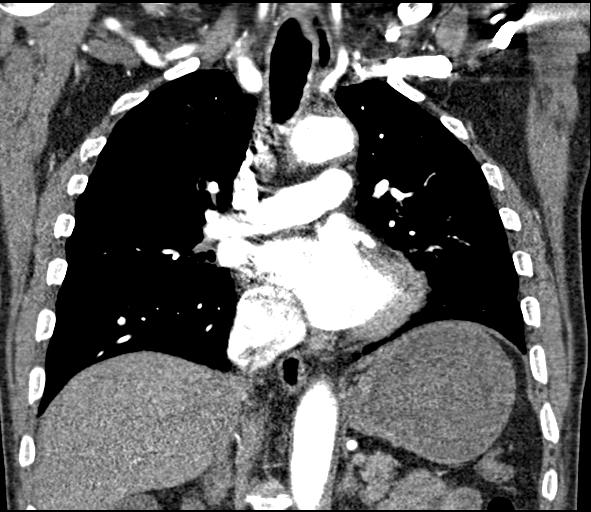
[im 85/113  soft-tissue]
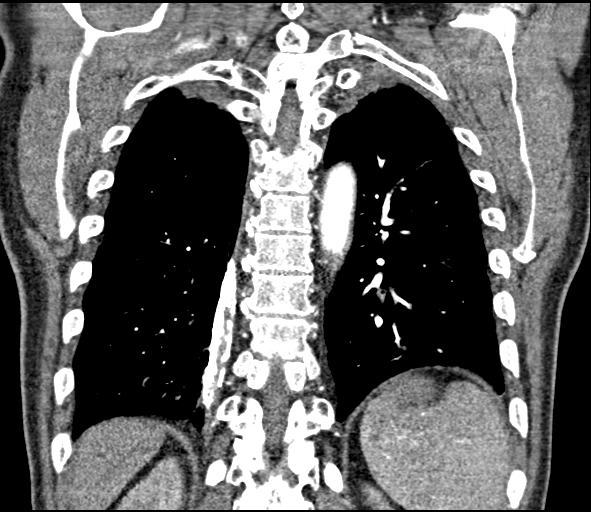

[18 of 46 positions shown; findings below may reference images not displayed]

FINDINGS: Cardiovascular: The heart is within normal limits in size for age.
No pericardial effusion. There is mild tortuosity, ectasia and
calcification of the thoracic aorta. No aneurysm or dissection.
Stable coronary artery calcifications.

The pulmonary arterial tree is fairly well opacified. No filling
defects to suggest pulmonary embolism.

Mediastinum/Nodes: Scattered mediastinal and hilar lymph nodes but
no mass or overt adenopathy. No change since prior study. Esophagus
is grossly normal.

Lungs/Pleura: Biapical pleural and parenchymal scarring changes
persist and appears slightly progressive. Stable emphysematous
changes and pulmonary scarring. No acute overlying pulmonary
process. No worrisome pulmonary lesions or interstitial lung
disease. Stable dense right-sided pleural calcification possibly
related to prior infection or trauma. The

Upper Abdomen: Small scattered calcified granulomas in the liver and
spleen.

Chest wall/ Musculoskeletal: No chest wall mass, supraclavicular or
axillary lymphadenopathy. The thyroid gland is grossly normal.

The bony thorax is intact.  No destructive bone lesions or fracture.

Review of the MIP images confirms the above findings.
IMPRESSION: 1. No CT findings for pulmonary embolism.
2. Stable tortuosity, ectasia and calcification of the thoracic
aorta. No aneurysm or dissection.
3. Stable coronary artery calcifications.
4. No acute pulmonary findings. Stable emphysematous changes and
pulmonary scarring.

## 2018-03-21 ENCOUNTER — Ambulatory Visit: Payer: Medicare Other | Admitting: Cardiovascular Disease

## 2018-03-24 DIAGNOSIS — H11151 Pinguecula, right eye: Secondary | ICD-10-CM | POA: Diagnosis not present

## 2018-03-24 DIAGNOSIS — H25813 Combined forms of age-related cataract, bilateral: Secondary | ICD-10-CM | POA: Diagnosis not present

## 2018-03-24 DIAGNOSIS — H52223 Regular astigmatism, bilateral: Secondary | ICD-10-CM | POA: Diagnosis not present

## 2018-03-24 DIAGNOSIS — H524 Presbyopia: Secondary | ICD-10-CM | POA: Diagnosis not present

## 2018-03-24 DIAGNOSIS — H5213 Myopia, bilateral: Secondary | ICD-10-CM | POA: Diagnosis not present

## 2018-03-24 DIAGNOSIS — H04123 Dry eye syndrome of bilateral lacrimal glands: Secondary | ICD-10-CM | POA: Diagnosis not present

## 2018-04-01 ENCOUNTER — Other Ambulatory Visit: Payer: Self-pay | Admitting: *Deleted

## 2018-04-01 MED ORDER — IPRATROPIUM-ALBUTEROL 0.5-2.5 (3) MG/3ML IN SOLN: 3.0000 mL | Freq: Two times a day (BID) | RESPIRATORY_TRACT | 2 refills | Status: DC

## 2018-04-09 DIAGNOSIS — W19XXXA Unspecified fall, initial encounter: Secondary | ICD-10-CM | POA: Diagnosis not present

## 2018-04-09 DIAGNOSIS — R61 Generalized hyperhidrosis: Secondary | ICD-10-CM | POA: Diagnosis not present

## 2018-04-09 DIAGNOSIS — R402441 Other coma, without documented Glasgow coma scale score, or with partial score reported, in the field [EMT or ambulance]: Secondary | ICD-10-CM | POA: Diagnosis not present

## 2018-04-09 DIAGNOSIS — R55 Syncope and collapse: Secondary | ICD-10-CM | POA: Diagnosis not present

## 2018-04-09 DIAGNOSIS — R569 Unspecified convulsions: Secondary | ICD-10-CM | POA: Diagnosis not present

## 2018-04-21 ENCOUNTER — Encounter: Payer: Self-pay | Admitting: Pulmonary Disease

## 2018-04-21 ENCOUNTER — Ambulatory Visit (INDEPENDENT_AMBULATORY_CARE_PROVIDER_SITE_OTHER): Payer: Medicare Other | Admitting: Pulmonary Disease

## 2018-04-21 ENCOUNTER — Other Ambulatory Visit (INDEPENDENT_AMBULATORY_CARE_PROVIDER_SITE_OTHER): Payer: Medicare Other

## 2018-04-21 ENCOUNTER — Ambulatory Visit (INDEPENDENT_AMBULATORY_CARE_PROVIDER_SITE_OTHER)
Admission: RE | Admit: 2018-04-21 | Discharge: 2018-04-21 | Disposition: A | Discharge status: Home / Self Care | Payer: Medicare Other | Source: Ambulatory Visit | Attending: Pulmonary Disease | Admitting: Pulmonary Disease

## 2018-04-21 VITALS — BP 132/80 | HR 72 | Temp 98.1°F | Ht 72.0 in | Wt 134.6 lb

## 2018-04-21 DIAGNOSIS — J449 Chronic obstructive pulmonary disease, unspecified: Secondary | ICD-10-CM | POA: Diagnosis not present

## 2018-04-21 DIAGNOSIS — R269 Unspecified abnormalities of gait and mobility: Secondary | ICD-10-CM

## 2018-04-21 DIAGNOSIS — I509 Heart failure, unspecified: Secondary | ICD-10-CM | POA: Diagnosis not present

## 2018-04-21 DIAGNOSIS — C049 Malignant neoplasm of floor of mouth, unspecified: Secondary | ICD-10-CM

## 2018-04-21 DIAGNOSIS — M8708 Idiopathic aseptic necrosis of bone, other site: Secondary | ICD-10-CM

## 2018-04-21 DIAGNOSIS — M879 Osteonecrosis, unspecified: Secondary | ICD-10-CM

## 2018-04-21 DIAGNOSIS — I428 Other cardiomyopathies: Secondary | ICD-10-CM

## 2018-04-21 DIAGNOSIS — R918 Other nonspecific abnormal finding of lung field: Secondary | ICD-10-CM | POA: Diagnosis not present

## 2018-04-21 DIAGNOSIS — M272 Inflammatory conditions of jaws: Secondary | ICD-10-CM | POA: Diagnosis not present

## 2018-04-21 DIAGNOSIS — I2510 Atherosclerotic heart disease of native coronary artery without angina pectoris: Secondary | ICD-10-CM | POA: Diagnosis not present

## 2018-04-21 DIAGNOSIS — F17210 Nicotine dependence, cigarettes, uncomplicated: Secondary | ICD-10-CM | POA: Diagnosis not present

## 2018-04-21 DIAGNOSIS — G620 Drug-induced polyneuropathy: Secondary | ICD-10-CM | POA: Diagnosis not present

## 2018-04-21 DIAGNOSIS — T451X5A Adverse effect of antineoplastic and immunosuppressive drugs, initial encounter: Secondary | ICD-10-CM

## 2018-04-21 LAB — CBC WITH DIFFERENTIAL/PLATELET
BASOS PCT: 0.7 % (ref 0.0–3.0)
Basophils Absolute: 0.1 10*3/uL (ref 0.0–0.1)
Eosinophils Absolute: 0.1 10*3/uL (ref 0.0–0.7)
Eosinophils Relative: 0.7 % (ref 0.0–5.0)
HEMATOCRIT: 44.7 % (ref 39.0–52.0)
Hemoglobin: 14.7 g/dL (ref 13.0–17.0)
LYMPHS ABS: 1.3 10*3/uL (ref 0.7–4.0)
LYMPHS PCT: 17.3 % (ref 12.0–46.0)
MCHC: 33 g/dL (ref 30.0–36.0)
MCV: 81.6 fl (ref 78.0–100.0)
MONOS PCT: 14.1 % — AB (ref 3.0–12.0)
Monocytes Absolute: 1 10*3/uL (ref 0.1–1.0)
NEUTROS ABS: 4.9 10*3/uL (ref 1.4–7.7)
NEUTROS PCT: 67.2 % (ref 43.0–77.0)
PLATELETS: 350 10*3/uL (ref 150.0–400.0)
RBC: 5.47 Mil/uL (ref 4.22–5.81)
RDW: 16.6 % — ABNORMAL HIGH (ref 11.5–15.5)
WBC: 7.3 10*3/uL (ref 4.0–10.5)

## 2018-04-21 LAB — COMPREHENSIVE METABOLIC PANEL
ALT: 7 U/L (ref 0–53)
AST: 10 U/L (ref 0–37)
Albumin: 4.1 g/dL (ref 3.5–5.2)
Alkaline Phosphatase: 95 U/L (ref 39–117)
BILIRUBIN TOTAL: 0.3 mg/dL (ref 0.2–1.2)
BUN: 14 mg/dL (ref 6–23)
CALCIUM: 9.5 mg/dL (ref 8.4–10.5)
CO2: 27 meq/L (ref 19–32)
Chloride: 100 mEq/L (ref 96–112)
Creatinine, Ser: 0.86 mg/dL (ref 0.40–1.50)
GFR: 112.43 mL/min (ref >60.00)
GLUCOSE: 115 mg/dL — AB (ref 70–99)
POTASSIUM: 3.7 meq/L (ref 3.5–5.1)
Sodium: 137 mEq/L (ref 135–145)
Total Protein: 8.8 g/dL — ABNORMAL HIGH (ref 6.0–8.3)

## 2018-04-21 LAB — SEDIMENTATION RATE: Sed Rate: 130 mm/hr — ABNORMAL HIGH (ref 0–20)

## 2018-04-21 NOTE — Patient Instructions (Signed)
Today we updated your med list in our EPIC system...    Continue your current medications the same...  Continue the DUONEB & BUDESONIDE via nebulizert twice daily...  Today we cultured the pus draining from your left mandible... We also checked a follow up CXR & blood work...    We will contact you w/ the results when available...   Please call me tomorrow with an update on DrWeissler's plans for this problem.Marland KitchenMarland Kitchen

## 2018-04-22 ENCOUNTER — Telehealth: Payer: Self-pay | Admitting: Pulmonary Disease

## 2018-04-22 ENCOUNTER — Other Ambulatory Visit: Payer: Self-pay | Admitting: Pulmonary Disease

## 2018-04-22 MED ORDER — AMOXICILLIN-POT CLAVULANATE 875-125 MG PO TABS: 1.0000 | ORAL_TABLET | Freq: Two times a day (BID) | ORAL | 0 refills | Status: AC

## 2018-04-22 NOTE — Telephone Encounter (Signed)
These reports have been faxed. Pt's wife, Neoma Laming is aware. Nothing further was needed.

## 2018-04-22 NOTE — Progress Notes (Addendum)
Subjective:     Patient ID: Julian Zimmerman, male   DOB: 07/25/1946, 72 y.o.   MRN: [id number]  HPI   ~  March 25, 2014:  In-patient pulmonary consultation by KC>   REFERRING PHYSICIAN:  Internal Medicine Teaching Service. HISTORY OF PRESENT ILLNESS:  The patient is a very pleasant 72 year old gentleman with very little prior medical care, who I have been asked to see for an abnormal CT chest.  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  He presented to the emergency room with progressive dyspnea on exertion, and a CT angio was done in order to rule out a pulmonary embolus.  This did not show a PET, but did show borderline lymphadenopathy, a small ground-glass opacity in the left lower lobe, pleural thickening with nodularity, right-sided pleural plaques primarily, medially, and finally large low attenuation masses in the pleural space and fissures bilaterally.  The patient has a long history of smoking, and probably has COPD.  He is being treated for a COPD exacerbation.  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  The patient denies any exposure to tuberculosis, but has never had a PPD placed.  He did, however, live in [place] for many years, but never in the [place].  The patient has very little cough and no congestion, but occasionally produces scant purulent mucus.  He has not had excessive weight loss. PAST MEDICAL HISTORY:  Significant for gout as well as arthritis.  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 FAMILY HISTORY:  Significant for diabetes only. SOCIAL HISTORY:  He is married and has worked for [employer], and [employer] as well as [employer].  He  has a history of smoking 1 pack per day for 52 years, but has not smoked since the beginning of the month. REVIEW OF SYSTEMS:  A 10-point review of systems was negative except for that mentioned in history of present illness.  PHYSICAL EXAMINATION:  GENERAL:  He is a well-developed male, in no acute distress. VITAL SIGNS:  Blood pressure 132/86, pulse is 100, respiratory rate 18. He is afebrile.  Oxygen saturation on room air is 92%-94%. HEENT:  Pupils equal, round, reactive to light and accommodation. Extraocular muscles are intact.  Nares are patent without discharge. Oropharynx is clear. NECK:  Without lymphadenopathy or thyromegaly. CHEST:  Very diminished breath sounds throughout, but no true wheezing. There was prominent upper airway pseudo wheezing that resolved with pursed lip breathing. CARDIAC:  Distant heart sounds but regular, 2/6 systolic murmur. ABDOMEN:  Soft, nontender, nondistended.  Good bowel sounds. GENITAL EXAM, RECTAL EXAM< BREAST EXAM:  Not done and not indicated. EXTREMITIES:  Lower extremities with edema noted, no calf tenderness and no cyanosis.  NEUROLOGICAL:  He is alert and oriented and moves all 4 extremities without deficits.  IMPRESSION: 1. Abnormal CT chest with low attenuation masses in the pleural space     bilaterally and especially in the fissures.  It is unclear whether     this is simply loculated fluid, or whether this is an inflammatory     pleural process.  I would find it very unlikely this is a     malignancy,  but certainly cannot exclude.  The patient has had very     little medical care over his life, and therefore no serial chest x-     rays for comparison.  The patient did have a chest x-ray in 2005     that did not showed these abnormalities.  The patient also has a     ground glass opacity in the left lower lobes that may be     inflammatory, but this may be an early malignancy.  He really does     not have significant mediastinal  adenopathy.  It is very difficult     to put all of this together, but the patient does have     granulomatous changes in his liver and spleen, and had lived in     [place] for many years of his life.  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  I would find this to be a very rare presentation     of sarcoidosis, however, nothing can be excluded at this point.  In     terms of planning, I would support following through with a PET     scan, and see where things stand.  At some point he may need     aspiration of one of these low attenuation masses that are abutting the right chest wall.     The pleural process may be nothing to worry about, and just represent loculated fluid.     I suspect none of this is related to his dyspnea on exertion, which     is probably due to COPD. SUGGESTIONS: 1. would place a PPD for completeness => reported NEG 2. Agree with PET scanning => done at [facility]:  CT Chest 04/05/14 showed cardiomegaly, atherosclerotic calcif in Ao & coronaries, +mediastinal adenopathy w/ largest 1.8cm in right lower paratrach region (favored to be reactive), marked centrilob & mild paraseptal emphysema in upper lobes, diffuse bilat GGOs, focal areas of consolidation bilat, mult loculated effusions along the major fissures bilat & along the right minor fissure (pseudotumors), pleural calcif are seen on the right;  Mult punctate calcif in liver & spleen c/w prior granulomatous dis, multilevel DJD in spine...  PET scan 04/05/14 showed intensely hypermetabolic mass involving the right floor of the mouth extending across the midline, mod to intesne FDG avid LNs concerning for mets;  Mild FDG avid mediastinal adenopathy favored to be reactive;;  No FDG avid pulm nodules, loculated pleural effusions, Abd & bones were neg...    ~  April 12, 2014:  Follow up OV by KC>    The patient comes in today after his recent  hospitalization for a COPD exacerbation.  He was discharged on Spiriva alone with Albuterol for rescue, and has done extremely well since that time. He feels that his breathing is adequate, and he rarely requires his rescue inhaler. He denies any significant cough or mucus production. The patient also has a history of a cardiomyopathy, with chronic congestive heart failure and lower extremity edema. He also has a history of an abnormal CT chest with loculated effusions and pleural calcifications. A recent PET scan that [facility] showed no abnormal uptake within his chest. He is getting ready to start chemotherapy for head and neck cancer next week in [facility]. IMP>>  1) Dyspnea:  The patient has significant dyspnea on exertion that I  suspect is related to COPD.  However, he has not had pulmonary function studies, and we will need these for evaluation. He is currently on Spiriva alone, and feels that he is doing extremely well. I will therefore continue him on this regimen. At some point will refer him to pulmonary rehabilitation, but he is getting ready to start chemotherapy for his head and neck cancer next week. 2) Abn CT Chest:  The patient has multiple abnormal findings on CT chest, but his recent PET scan shows no abnormal uptake within the chest. It is unclear whether his abnormal findings are related to asbestos exposure or possibly old granulomatous disease. The patient also has chronic congestive heart failure, and perhaps these are chronically loculated effusions.  ADDENDUM>>  Outpt Full PFTs done 05/04/14:  FVC=3.70 (92%), FEV1=2.72 (89%), %1sec=73, mid-flows reduced at 72% predicted; Post bronchodil FEV1 did NOT improve;  TLC=5.94 (82%);  DLCO=39% & DL/VA=49%... He was asked to ret in 3-4 months but he did not do so-- all medical follow up was from [facility]  ENT, Oncology, RAD-ONC, and Cardiology...    ~  July 01, 2016:  74moROV w/ SN>  FJerimyahpresents self-referred because "I want to  stop the Spiriva" - this is his whole agenda for the visit, he is here w/ his wife who helps w/ the history >   I have spent several hours pouring over his Epic record from 2015 and his extensive [facility] the following Problem List>>    Squamous cell Ca floor of the mouth>  Initial eval in [place] [facility]not felt to be a surg candidate due to severe CHF/ cardiomyopathy) w/ ChemoRx including carbo/taxol/cetux starting in 04/2014, then cetux+XRT from 9/29 - 08/27/14 for total 70Gy;  He is followed regularly (Q659moby ENT-DrWeissler & XRT-DrChera last seen 04/23/16 & no evid of recurrent or metastatic dis found on f/u CT scan;  Thyroid function tests checked periodically & remain wnl as well;  Last seen by DrWeissler 01/13/16-- 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     Chemotherapy induced peripheral neuropathy>  [facility] in [facility] and feet reported to be improved w/ Cymbalta60/d and Neurontin300Tid (prev on 900Bid but he weaned it on his own) => they signed off 7 indicated that his PCP was taking over this issue...     COPD- mixed chr bronchitis & emphysema (seen on prev CT Chest)>      Cigarette smoker>  He quit transiently during his Chemoradiation but then restarted & currently smoking betw 1/2-1ppd;  He started smoking in teens, has smoked >50 yrs up to 2ppd w/ an est ~80 pack-yr smoking hx overall;  He [facility] a [facility] smoking cessation program but was unable to quit;  They continue CT Chest Q6m70mor follow up evals...    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 as seen on prev CT scans    HBP, CAD> (atherosclerotic changes seen in ao & coronaries on CT Chest), Cath 03/2014 by DrVaranasi showed 50% midLAD stenosis, small Circ w/ 60% stenosis in large ramus branch, mild dis in dominant RCA=> placed on aggressive medical therapy & [facility]  [facility]> on ASA81, MetoprololER50, Lasix20-just taken prn edema; NOTE: he is very sens to Lisinopril w/ hypotension x2 in past;     Non-ischemic  cardiomyopathy> w/ EF=20-25% on 2DEcho 03/2014 assoc w/ mildMR, modTR, PAsys=43mHg;  His initial BNP was >23000;  [facility] w/ medical management (per 2DEcho 12/2014)...    Hyperlipidemia> on Atorva40>  ?last FLP 04/06/14 [facility] TChol 164, [facility]     Hx esophageal food impactions requiring EGD & endoscopic food removal by DrOutlaw 1/16 & 3/16 (believed due to eating large food bolus w/o chewing due to his dental issues, no underlying esoph pathology described); dietary adjustments and dental work recommended    Hx alcohol abuse & prot-cal malnutrition>  He has cut back drinking Etoh but still drinks-- serum proteins/ LFTs/ Alb are all wnl now...    Dental problems>  He has dry mouth & uses Biotene...    Anemia>  His last CBC was Jan2016 w/ Hg= 10.3, renal function is wnl    Pt has declined Flu shots> states allergic w/ rash after Flu shotn yrs ago... EXAM shows Afeb, VSS, O2sat=100% on RA at rest; Wt=165#, 6"Tall, BMI=22.4;  HEENT- teeth poor, scarring floor of mouth, indurated neck tissue;  Chest- decr BS bilat, clear w/o w/r/r;  Heart- RR w/o m/r/g;  Abd- soft, nontender, neg;  Ext- VI, no c/c/e;  Neuro- +neuropathy in hands/ feet...   [facility]/ [facility]>  Normal heart size, scat coronary calcif, unchanged pericard calcif, no pericardial fluid;  Stable emphysema w/ biapical scarring, unchanged pleural calcif, no new pulm nodules, no adenopathy;  Scattered hepatic & splenic granulomas;  DDD, no suspicious osseous lesions...   CXR 07/01/16>  Norm heart size, min blunting of angles bilat, clear lungs w/ biapical pleural thickening, NAD; prev bilat pseudotumors have resolved...   Spirometry 07/01/16>  FVC=3.55 (86%), FEV1=2.00 (64%), %1sec=56%,  mid-flows are reduced at 22% predicted... This represents a 25% drop in his FEV1 over the last 239yrdespite his Spiriva Rx...  Ambulatory Oximetry 07/01/16>  O2sat=99% on RA at rest w/ pulse=73/min;  He ambulated 3 Laps (185' each) w/ lowest O2sat=97% w/ pulse 86/min, no desaturations...  IMP/PLAN>>  FrJosph Machoeems annoyed that his pulm function has deteriorated over the last several yrs- despite his continued smoking, the interval treatment for his SCCa of the mouth, etc; this has occurred despite the Spiriva daily during that interval; I told him that he desperately needs to quit all smoking AND w/ need to add an ICS/LABA to his regimen- continue Spiriva daily & add BREO- one inhalation daily; he can still use an Albut rescue inhaler as needed; recall that his FullPFTs in 2015 showed a severe reduction in DLCO c/w emphysema... His whole objective in obtaining this pulmonary f/u visit was to be able to STOP his Spiriva rx and we have instead reinforced the need to quit smoking & added the [facility] w/ these recommendations...    ~  Feb 08, 2017:  7-16m56moV and urgent add-on appt requested by pt's wife for hemoptysis>  As above FreJosph Machos not too pleased that his continued smoking over the last few yrs (despite Dx & Rx for Stage 4 SCCa floor or mouth) lead to worsening COPD & the need for MORE meds (Breo added to his Spiriva/ AlbutHFA);  Not surprisingly he did not take the Breo, continued smoking, and did not follow up w/ us [place] requested; Note: he continued the Spiriva as before;  Pt's wife brings him in today when she heard him coughing & found a tissue  w/ blood present=> when pressed for more info & the truth pt indicates that he's had hemoptysis for at least a few weeks- notes cough, mostly dry w/o sputum, occurs day & night, denies SOB or CP, occas notes streaky BRB but he is quite sure there is no sput, denies f/c/s, wife notes some congestion/ rattling/ wheezing but he denies, wife saw blood for  the 1st & only time earlier today, needless to say he has not taken anything extra for this problem...     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  Review of Care Everywhere records reveals:     He [facility] NP-Lewis [facility]/16 for [facility]> HBP, CAD (50% mid-LAD on cath 03/2014), CHF w/ most recent 2DEcho (12/2014) showing EF=50-55% on Metop & Lasix; intol to Lisinopril w/ low BPs; rec to ret in 148yr.    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 & 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 (pt denied hearing problems; he developed osteoradionecrosis of the mandible & is getting hyperbaric oxygen therapy w/ plans to remove the dead bone thereafter by dental medicine- exam w/ bilat exposed bone worse on the left, no evid recurrent cancer;  Latest CT Chest 10/26/16 showed norm heart size, atherosclerotic changes in Ao & coronaries, unchanged pericardial calcif as well, stable biapical scarring & emphysema, no new pulm nodules, unchqnged right pleural calcif, no  adenopathy, bilat gynecomastia, scattered hepatic & splenic granulomas, degen changes in spine;  Latest labs 10/26/16 w/ TSH=4.11 & FreeT4=0.79...  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     Chemotherapy induced peripheral neuropathy>  [facility] reported to be improved w/ Cymbalta60/d and Neurontin300Tid (prev on 900Bid but he weaned it on his own) => they signed off 7 indicated that his PCP was taking over this  issue...     COPD- mixed chr bronchitis & emphysema (seen on prev CT Chest)>      Cigarette smoker>  He quit transiently during his Chemoradiation but then restarted & currently smoking betw 1/2-1ppd;  He started smoking in teens, has smoked >50 yrs up to 2ppd w/ an est ~80 pack-yr smoking hx overall;  He went through a [facility] smoking cessation program but was unable to quit;  They continue CT Chest Q65mofor follow up evals...    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 as seen on prev CT scans    HBP, CAD> (atherosclerotic changes seen in ao & coronaries on CT Chest), Cath 03/2014 by DrVaranasi showed 50% midLAD stenosis, small Circ w/ 60% stenosis in large ramus branch, mild dis in dominant RCA=> placed on aggressive medical therapy & [facility]- last seen 01/20/16> on ASA81, MetoprololER50, Lasix20-just taken prn edema; NOTE: he is very sens to Lisinopril  w/ hypotension x2 in past;     Non-ischemic cardiomyopathy> w/ EF=20-25% on 2DEcho 03/2014 assoc w/ mildMR, modTR, PAsys=578mg;  His initial BNP was >23000;  [facility]-55% w/ medical management (per 2DEcho 12/2014)...    Hyperlipidemia> on Atorva40>  ?last FLP 04/06/14 [facility]Chol 164, [facility]     Hx esophageal food impactions requiring EGD & endoscopic food removal by DrOutlaw 1/16 & 3/16 (believed due to eating large food bolus w/o chewing due to his dental issues, no underlying esoph pathology described); dietary adjustments and dental work recommended    Hx alcohol abuse & prot-cal malnutrition>  He has cut back drinking Etoh but still drinks-- serum proteins/ LFTs/ Alb are all wnl now...    Dental problems>  He has dry mouth & uses Biotene...    HxAnemia>  His last CBC was Jan2016 w/ Hg= 10.3, renal function is wnl    Pt has declined Flu shots> states allergic w/ rash after Flu shotn yrs ago... EXAM shows Afeb, VSS, O2sat=100% on RA at rest; Wt=161#, 6"Tall, BMI=22;  HEENT- edentulous & exposed mandib bone bilat, scarring floor of mouth, indurated neck tissue;  Chest- decr BS bilat, clear w/o w/r/r;  Heart- RR w/o m/r/g;  Abd- soft, nontender, neg;  Ext- VI, no c/c/e;  Neuro- +neuropathy in hands/ feet...   CXR 02/08/17 (independently reviewed by me in the PACS system) showed norm heart size, essentially clear lungs w/ sl blunt angles bilat- NAD...  CT Angiogram Chest 02/09/17 showed norm heart size, atherosclerotic changes in Ao & coronaries; NEG for PE; scat mediastinal & hilar LNs- no masses, no change from prior CT 03/2014; biapical pleuroparenchymal scarring & emphysematous changes- no acute pulm process identified (no worrisome lesions or interstitial process); stable dense right sided pleural calcif (?old infection or trauma?); scat calcif granulomas in liver & spleen...  Ambulatory Oximetry 02/08/17 on RA>  O2sat=100% on RA at rest  w/ pulse=90/min.  He ambulated 2 laps in office w/ lowest O2sat=99% w/ max pulse=99/min (stopped due to difficulty walking & balance issues)...  LABS 02/08/17>  Chems- wnl x BS=123, Cr=1.06, LFTs=wnl;  CBC- ok w/ Hg=12.9, mcv=83, WBC=4.1;  TSH=5.91 (s/p XRT, not on meds);  Sed=99;  BNP=26;  PT/PTT- wnl... IMP/PLAN>>  We discussed immediate therapy w/ [id number], MEDROL Dosepak, Quit all smoking, Hold ASA, Add the Breo one inhalation daily to the Spiriva daily;  OK to resume Hyperbaric treatments, and we plan ROV recheck in 2 wks...Marland KitchenMarland Kitchen  NOTE:  >50% of this 60 min rov was spent in counseling & coordination of care...  ~  Feb 17, 2017:  2wk ROV & pulmonary recheck> Julian Zimmerman has completed his Levaquin & Medrol dosepak and reports a good response w/ resolution of the hemoptysis & appetite improving; he notes persistent cough, small amt congestion but no sput or hemoptysis now; similarly he denies SOB/ CP/ f/c/s/ etc;  Overall his breathing is better he says however he continues to smoke ~1ppd despite all efforts to get him to quit;  Wife notes that Breo costs $126 per month & Spiriva is $90/mo now that they have met all deductible expenses- we discussed getting a copy of their Prescription Drug Formulary to aid [place] in selecting the least expensive/ most effective medication available, and I reminded them that 1ppd at $5 per pack is ~$150/month in cigarettes!      I again reviewed the Care Everywhere notes from [place] pt's wife indicates that he had some dental surg (?debridement ?bone graft?) in April & now the hyperbaric oxygen treatments are in anticipation of his being set up w/ dentures but there are NO [place] NOTES IN EPIC>>  His PCP is Eloise Levels, NP at [facility] but they are not sure of his last visit there... 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     Chemotherapy induced peripheral neuropathy>  [facility] symptoms in hands and feet reported to be improved w/ Cymbalta and Neurontin...    Osteonecrosis of mandible>  Eval by ENT- DrWeissler, XRT- DrChera, & [facility], ?bone grafting, & referred for Hyperbaric oxygen=> currently in middle of 50 treatment program...    COPD- mixed chr bronchitis & emphysema (seen on prev CT Chest)>  Spirometry 06/2016 w/ mod airflow obstruction & deterioration in his FEV1 over the prev 240yr placed on BREO & Spiriva, advised to quit all smoking!    Cigarette smoker>  He quit transiently during his Chemoradiation but then restarted & currently smoking ~1ppd;  He started smoking in teens, has smoked >50 yrs up to 2ppd w/ an est ~80+ pack-yr smoking hx overall;  He went through a [facility] smoking cessation program but was unable to quit;  They continue CT Chest Q6m82mor follow up evals...    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 as seen on prev CT scans    HBP, CAD> (atherosclerotic changes seen in ao & coronaries on CT Chest), Cath 03/2014 by DrVaranasi showed 50% midLAD stenosis, small Circ w/ 60% stenosis in large ramus branch, mild dis in dominant RCA=> placed on aggressive medical therapy & [facility] on ASA81, MetoprololER50, Lasix20-just taken prn edema; NOTE: he is very sens to Lisinopril w/ hypotension x2 in past;     Non-ischemic cardiomyopathy> w/ EF=20-25% on 2DEcho 03/2014 assoc w/ mildMR, modTR, PAsys=56m41m  His initial BNP was  >23000;  Follow up studies on therapy at [facility] showed improvement in EF to 50-55% w/ medical management (per 2DEcho 12/2014)...    Hyperlipidemia> on Atorva40>  ?last FLP 04/06/14 showed TChol 164, HDL 61 at [facility]...     Hx esophageal food impactions requiring EGD & endoscopic food removal by DrOutlaw 1/16 & 3/16 (believed due to eating large food bolus w/o chewing due to his dental issues, no underlying esoph pathology described); dietary adjustments and dental work recommended    Hx alcohol abuse & prot-cal malnutrition>  He has cut back drinking Etoh but still drinks-- serum proteins/ LFTs/ Alb are all wnl now...    Dental problems>  He has dry mouth & uses Biotene...    HxAnemia>  His last CBC was Jan2016 w/ Hg= 10.3, renal function is wnl    Pt has declined Flu shots/ vaccinations> states allergic w/ rash after Flu shot yrs ago; reminded to consult w/ his PCP about indicated vaccinations...Marland KitchenMarland KitchenMarland Kitchen EXAM shows Afeb, VSS, O2sat=99% on RA at rest; Wt=158#, 6"Tall, BMI=22;  HEENT- edentulous & exposed mandib bone bilat, scarring floor of mouth, indurated neck tissue;  Chest- decr BS bilat, clear w/o w/r/r;  Heart- RR w/o m/r/g;  Abd- soft, nontender, neg;  Ext- VI, no c/c/e;  Neuro- +neuropathy in hands/ feet...  IMP/PLAN>>  Julian Zimmerman's congestion & hemoptysis have resolved w/ Levaquin & Medrol- now states breathing is at his baseline on Breo & Spiriva daily;  He MUST quit smoking (still at 1ppd) esp to save $$ and afford his meds;  They will get copy of his formulary & we can adjust once we see his coverage; we plan routine recheck in 35mo..  ~  April 20, 2017:  258moOV & [facility] 04/12[facility]17 stating "I slipped and fell, they don't know what happened"; DCSummary reviewed- presented w/ syncope, he was orthostatic, no arrhythmias detected, 2DEcho showed EF improved to 50-55%, no wall motion abn, no DD;  He was given IVF & improved (NOTE: has hx of extreme sensitivity to Lisiinopril w/ hypotension in past); no change  in respiratory meds; he received PT/OT=> [facility]ch [facility]/ [facility] follow up by NP-Lewis & 14d Event Monitor applied...     From the pulmonary standpoint- FrJosph Machondicates that he is breathing OK, min cough, small amt whitish sput, no blood or discoloration, ans denies SOB/ CP/ edema;  He has difficulty ambulating due to his neuropathy- (followed by Neuro & he saw DrPatel in June2018 w/ EMG/NCV done);  He is getting PT/OT at home now trying to incr his mobility but he is unable to exert himself;  NOTE: wife indicates that he likes to sleep on the couch, snores loudly, & is restless & talks in his sleep;  For his part- pt states he rests well, wakes refreshed and denies daytime hypersomnolence BUT will nap 7/7 days and sometimes falls asleep watching TV in eve => we reviewed indication for a Sleep Study & we will set this up for him...    We reviewed his medical history & problem list as above... EXAM shows Afeb, VSS, O2sat=98% on RA at rest; Wt=158#, 6"Tall, BMI=22;  HEENT- edentulous & exposed mandib bone bilat, scarring floor of mouth, indurated neck tissue;  Chest- decr BS bilat, clear w/o w/r/r;  Heart- RR w/o m/r/g;  Abd- soft, nontender, neg;  Ext- VI, no c/c/e;  Neuro- +neuropathy in hands/ feet...   CT Head & Cspine 05/11/17>  IMPRESSION: No evidence of traumatic intracranial or Cspine injury or fracture; Mild cortical  volume loss and scattered small vessel ischemic microangiopathy; Mild degenerative change noted along the cervical spine; Emphysema at the lung apices; Calcification at the carotid bifurcations bilaterally; Near complete opacification of the mastoid air cells bilaterally...  CXR 04/11/17> Borderline heart size, atherosclerosis of the thoracic aorta, hyperinflation and emphysema- no acute abnormality.  2DEcho 04/12/17>  LVF at low end of norm w/ EF=50-55%, no regional wall motion abn, norm diastolic parameters, AoV sclerosis w/o stenosis, norm MV, norm LA&RA, RV was norm & PA pressure could not be  accurately estimated  LABS reviewed>  Chems- ok w/ Cr=0.85, Alb=3.1;  CBC- ok w/ Hg=11.7;  Troponins- neg... IMP/PLAN>>  Julian Zimmerman was adm w/ syncope- he was orthostatic & improved w/ IVF, no arrhythmias found, [facility] has applied a 14d event monitor for completeness; He is very sensitive to ACE inhibitors and should not receive these meds;  From the pulmonary standpoint he says his breathing is stable- asked to STOP ALL SMOKING, take the [place] once daily, gradually increase exercise program;  When asked wife notes that he snores some & is occas restless at night, pt feels that he rests well, and we discussed a screening HOME SLEEP TEST to r/o OSA...   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   ~  October 21, 2017:  79moROV & Julian Machoreturns without complaints or concerns he says-- still smoking 1ppd, says his breathing is OK, notes sl cough, small amt whitis sput, no hemoptysis;  While he denies SOB w/ activity- his wife indicates that he is SOB while showering, and if he tries to help her some around the house- then he needs to lie down & it's been this way for a couple of yrs...  He also denies CP, palpit, edema...  We reviewed the following interval medical reports in Epic & CareEverywhere>      [facility]12[facility]17>  Hx SCCa floor of the mouth w/ pos LNs 2015 & not a surg cand due to severe cardiomyopathy w/ EF=20-25%; s/p chemoradiation w/ resultant osteonecrosis of the mandible- dental med treated him w/ hyperbaric oxygen, on Peridex rinses and observation status;     He saw [facility]- JLewis,NP on 07/22/17>  SOB, CHF, HBP, CAD, smoking- on same meds x wife stopped his  Lipitor due to fatigue; must quit smoking, meds kept the same, f/u 154yr.     He saw NEURO- DrPatel on 09/06/18>  F/u chemo-induced neuropathy, ulnar neuropathy, & tremors; hx alcoholism (quit 2015), no DM, neg fam hx;  He tapered off his Gabapentin & was supposed to be on Cymbalta60 but it wasn't on his med list;  She rec reducing Cymbalta to 3039m to see if he needs it; Quit smoking, stayoff Etoh, f/u 48yr60yr   [facility]>  Hx postural hypotension & had syncopal spell at home; no CP/ palpit/ SOB/ leg discomfort x neuropathy;  CXR- NAD;  EKG- NSR, min NSSTTWA;  Labs- Chems- wnl, BS=159, Cr=1.03; CBC- ok w/ Hg=11.6 (mcv=83), WBC=6K; UA- clear;  Given IV NS bolus, monitor was all NSR, no orthostatic changes  on recheck & disch home... 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    Chemotherapy induced peripheral neuropathy>  Followed by Oncology division at [facility]-- symptoms in hands and feet reported to be improved w/ Cymbalta and Neurontin...    Osteonecrosis of mandible>  Eval by ENT- DrWeissler, XRT- DrChera, & Dental Medicine at [facility] w/ surgical debridement, ?bone grafting, & referred for Hyperbaric oxygen=> completed 50 treatment program...    COPD- mixed chr bronchitis & emphysema (seen on prev CT Chest)>  Spirometry 06/2016 w/ mod airflow obstruction & deterioration in his FEV1 over  the prev 62yr; placed on BREO & Spiriva, advised to quit all smoking!  He c/o cost of inhalers (still smoking 1ppd) & we switched to NEBULIZER w/ Duoneb Bid & Budesonide 0.25Bid...    Cigarette smoker>  He quit transiently during his Chemoradiation but then restarted & currently smoking ~1ppd;  He started smoking in teens, has smoked >50 yrs up to 2ppd w/ an est ~80+ pack-yr smoking hx overall;  He went through a [facility] smoking cessation program but was unable to quit;  They continue CT Chest Q643[place]oor follow up evals...    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 as seen on prev CT scans    HBP, CAD> (atherosclerotic changes seen in ao & coronaries on CT Chest), Cath 03/2014 by DrVaranasi showed 50% midLAD stenosis, small Circ w/ 60% stenosis in large ramus branch, mild dis in dominant RCA=> placed on aggressive medical therapy & [facility]> on ASA81, MetoprololER50, Lasix20-just taken prn edema; NOTE: he is very sens to Lisinopril w/ hypotension x2 in past;     Non-ischemic cardiomyopathy> w/ EF=20-25% on 2DEcho 03/2014 assoc w/ mildMR, modTR, PAsys=5222m;  His initial BNP was >23000;  [facility]-55% w/ medical management (per 2DEcho 12/2014)...    Hyperlipidemia> prev on Atorva40>  Wife weaned him off the statin due to fatigue, ?last [facility]06[facility]14 [facility] 164[facility]     Hx esophageal food impactions requiring EGD & endoscopic food removal by DrOutlaw 1/16 & 3/16 (believed due to eating large food bolus w/o chewing due to his dental issues, no underlying esoph pathology described); dietary adjustments and dental work recommended    Hx alcohol abuse & prot-cal malnutrition>  He has cut back drinking Etoh but still drinks-- serum proteins/ LFTs/ Alb are all wnl...    Dental problems>  He has dry mouth & osteoradionecrosis of the mandible, uses Biotene...    HxAnemia>  prev CBC w/ Hg= 10.3, then 11.3, then 11.7;   renal function is wnl EXAM shows Afeb, VSS, O2sat=94% on RA at rest; Wt=161#, 6"Tall, BMI=22-3;  HEENT- edentulous & exposed mandib bone bilat, scarring floor of mouth, indurated neck tissue;  Chest- decr BS bilat, clear w/o w/r/r;  Heart- RR  w/o m/r/g;  Abd- soft, nontender, neg;  Ext- VI, no c/c/e;  Neuro- +neuropathy in hands/ feet...   CXR 10/03/17 (ER w/ syncope)>  Norm heart size, apical pleural thicening bilat, clear lungs- NAD  LABS 10/03/17 in ER>  Chems- wnl, BS=159, Cr=1.03; CBC- ok w/ Hg=11.6 (mcv=83), WBC=6K; UA- clear IMP/PLAN>>  Julian Zimmerman continues to smoke, despite everything he's been thru;  He complains that the inhalers are too expensive, (in donut hole esp) making compliance poor- REC change to NEBULIZER w/ DUONEB Bid & BUDESONIDE 0.25 Bid;  He needs to maintain close f/u w/ his ENT Team at [facility] regarding his prev SCCa of mouth floor but also due to complications of chemotherapy induced peripheral neuropathy, and osteonecrosis of mandible;  We will plan pulmonary recheck in 22mo..  ~  ADDENDUM>>  Low-dose [facility] CT Chest [facility] 10[facility]/17 at [facility]>>  IMPRESSION:  Normal heart size. Coronary artery and aortic calcifications. No adenopathy. No suspicious bone lesions are seen. COPD/ Emphysema. Biapical pulmonary parenchymal opacities are likely postradiation changes. Calcified pleural plaques and pleural thickening in the right hemithorax may be sequela of prior empyema or hemothorax. 2 mm solid subpleural right middle lobe nodule on image 35, and left upper lobe 3 mm solid pulmonary nodule on image 24, are unchanged.   ~  April 21, 2018:  656moOV & Fredhas continued to have serious problems with his mouth/ jaw>  [facility]Dr[facility]issler et al) for an abscess/ draining sinus tract on the right side of his chin in April w/ an extended course of antibiotic including PenVK, then Augmentin;  By his wife's report this area improved, but towards the end of June he  developed another problem on the left side w/ swelling, tenderness, & ultimately drainage from an abscess in this area ([place]  [facility] I cannot find notes in Care Everywhere portal regarding their exchange;  She has been waiting for their f/u appt in August w/ DrWeissler...    They present to my office today for a routine Pulmonary follow up visit but the obvious more serious problem is a swelling in his left mandibular area w/ draining pus under the mid-portion of the mandible; the area is tender & the face is asymmetric- I cultured the drainage, checked a f/u CXR & Labs, and started him on AUGMENTIN [id number] #[id number] provided with instructions [facility] r/o [facility]omyel[facility] and possibly ID consultation once the cultures are avail to consider IV antibiotic treatment, ?hyperbaric therapy, perhaps he would reconsider surgical options if any at this stage... We reviewed the following interval medical notes avail in EpiHernandortal>      [facility] & 01/24/18>  Presented w/ draining abscess on right side of chin-- PenVK had been called-in, then Rx w/ Augmentin x 3wks and Peridex rinses>> Assessment:  [age]o. male comes in today for follow-up of advanced oral cavity cancer treated with combined concomitant chemoradiation in 2015. He continues to smoke about a pack of cigarettes a day. 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 He has continuously refused and continues to refuse consideration of mandibulectomy and vascularized free flap reconstruction. Comes in today because of some increased drainage from his chin. He was started on some Augmentin over the phone. And the drainage has improved. On physical exam, he continues to have near  total exposure of his mandible intraorally bilaterally. There is a draining sinus tract over his chin. I do not see any  obvious recurrent cancer at this point. Once again I have discussed mandibulectomy and free flap reconstruction, but he is not interested in that. I recommended that he stop tobacco products. Plan:  Continue his course of Augmentin. Follow-up with me in August or sooner if there is any interim problems. 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    Chemotherapy induced peripheral neuropathy>  Followed by Oncology division at [facility]-- symptoms in hands and feet reported to be improved w/ Cymbalta and Neurontin=> pt weaned down...    Osteonecrosis of mandible>  Eval by ENT- DrWeissler, XRT- DrChera, & Dental Medicine [facility] w/ surgical debridement, ?bone grafting, & referred for Hyperbaric oxygen=> completed 50 treatment program...    COPD- mixed chr bronchitis & emphysema (seen on prev CT Chest)>  Spirometry 06/2016 w/ mod airflow obstruction & deterioration in his FEV1 over the prev 9yr; placed on BREO & Spiriva, advised to quit all smoking!  He c/o cost of inhalers (still smoking 1ppd) & we switched to NEBULIZER w/  Duoneb Bid & Budesonide 0.25Bid=> he is stable on this...    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 as seen on prev CT scans    HBP, CAD> (atherosclerotic changes seen in ao & coronaries on CT Chest), Cath 03/2014 by DrVaranasi showed 50% midLAD stenosis, small Circ w/ 60% stenosis in large ramus branch, mild dis in dominant RCA=> placed on aggressive medical therapy & [facility] seen 07/2017> on ASA81, MetoprololER50, Lasix20-just taken prn edema; NOTE: he is very sens to Lisinopril w/ hypotension x2 in past;     Non-ischemic cardiomyopathy> w/ EF=20-25% on 2DEcho 03/2014 assoc w/ mildMR, modTR, PAsys=519mg;  His initial BNP was >23000;  [facility]-55% w/ medical management...    Hyperlipidemia> prev on Atorva40>  Wife weaned him off the statin due to fatigue, ?last FL[facility] 04/06/14 [facility]ol 164[facility]     Hx esophageal food impactions  requiring EGD & endoscopic food removal by DrOutlaw 1/16 & 3/16 (believed due to eating large food bolus w/o chewing due to his dental issues, no underlying esoph pathology described); dietary adjustments and dental work recommended    Hx alcohol abuse & prot-cal malnutrition>  He has cut back drinking Etoh but still drinks-- serum proteins/ LFTs/ Alb are remarkably all wnl...    Dental problems>  He has dry mouth & osteoradionecrosis of the mandible, uses Biotene...    HxAnemia>  prev CBC w/ Hg= 10.3, then 11.3, then 11.7;  renal function is wnl EXAM shows Afeb, VSS, O2sat=99% on RA at rest; Wt is down 26# to  135#, 6"Tall, BMI=18;  HEENT- edentulous & exposed mandib bone bilat, scarring floor of mouth, indurated neck tissue & now w/ abscess & pus draining from left submandibular area;  Chest- decr BS bilat, clear w/o w/r/r;  Heart- RR w/o m/r/g;  Abd- soft, nontender, neg;  Ext- VI, no c/c/e;  Neuro- +neuropathy in hands/ feet...   CXR 04/21/18 (independently reviewed by me in the PACS system) shows norm heart size, COPD/emphysema w/ pleural based calcif in medial right base- unchanged and no new infiltrate, effusion, mass, etc...  LABS 04/21/18>  Chems- ok w/ BS=115, Cr=0.86, LFTs wnl and Alb=4.1;  CBC- ok w/ Hg=14.7, WBC=7.3;  Sed=130 IMP/PLAN>>  Julian Zimmerman has a serious problem involving his left mandible- submandib abscess, r/o osteomyelitis of this bone, needs prompt attention by his [facility] care team w/ consideration of ID consult, IV antibiotic options if needed, and Pt may need to reconsider his refusal for mandibulectomy and flap reconstruction once this infection is under control;  We started pt on AUGMENTIN 875m Bid for now... [facility]  ADDENDUM>> Wife indicates [facility]>  She said that they have rec surg as the best option, stating he has dead bone & will likely continue to deteriorate, needs bone graft, & in the meanwhile has placed him on PCN Qid for 6 wks w/ [facility](Note- DrWeissler is retiring & Julian Machohas f/u appt w/ DrPatel in Oct)...    Past Medical History:  Diagnosis Date   COPD/ emphysema >> on , switched to NEB w/ Duoneb + Budes Bid    Tobacco abuse >> he continues to smoke 1ppd & has an ~80+ pack-yr hx   . Arthritis    "right leg" (03/23/2014)   Gout    HBP >> on ToprolXL50, Lasix20    CAD >> nonobstructive CAD on Cath 2015   . Non-ischemic cardiomyopathy >> EF improved from 20-25% in 2015 to 50-55% 12/2014 on Rx   . Hyperlipidemia >> on Lipitor40   . [place] ([place] >> Squamous cell ca of the ant floor  of the mouth         s/p Chemotherapy, [facility]    Chemotherapy induced peripheral neuropathy >> on Gabapentin/ Cymbalta per DrPatel    Radiation therapy induced osteonecrosis of the mandible >> [facility](prev rx hyperbaric oxygen=> ?surg debridement planned?)     Past Surgical History:  Procedure Laterality Date  . ESOPHAGOGASTRODUODENOSCOPY N/A 12/26/2014   Procedure: ESOPHAGOGASTRODUODENOSCOPY (EGD);  Surgeon: WArta Silence MD;  [facility];  Service: Endoscopy;  Laterality: N/A;  . FLOOR OF MOUTH BIOPSY  03/2014   "found cancer"  . FOREIGN BODY REMOVAL N/A 10/12/2014   Procedure:  FOREIGN BODY REMOVAL;  Surgeon: Jeryl Columbia, MD;  Location[facility];  Service: Endoscopy;  Laterality: N/A;  . FOREIGN BODY REMOVAL N/A 12/26/2014   Procedure: FOREIGN BODY REMOVAL;  Surgeon: Arta Silence, MD;  Location: [facility];  Service: Endoscopy;  Laterality: N/A;  . FRACTIONAL FLOW RESERVE WIRE  03/27/2014   Procedure: FRACTIONAL FLOW RESERVE WIRE;  Surgeon: Jettie Booze, MD;  Location: [facility] CATH LAB;  Service: Cardiovascular;;  . LEFT AND RIGHT HEART CATHETERIZATION WITH CORONARY ANGIOGRAM N/A 03/27/2014   Procedure: LEFT AND RIGHT HEART CATHETERIZATION WITH CORONARY ANGIOGRAM;  Surgeon: Jettie Booze, MD;  Location[facility] CATH LAB;  Service: Cardiovascular;  Laterality: N/A;  . [place]    Outpatient Encounter Medications as of 04/21/2018  Medication Sig  . aspirin EC 81 MG EC tablet Take 1 tablet (81 mg total) by mouth daily.  . budesonide (PULMICORT) 0.25 MG/2ML nebulizer solution Take 2 mLs (0.25 mg total) by nebulization 2 (two) times daily.  . Cholecalciferol (VITAMIN D3) 1000 units CAPS Take 2,000 Units by mouth daily.   . DULoxetine (CYMBALTA) 30 MG capsule Take 1 capsule (30 mg total) by mouth daily.  [name] ipratropium-albuterol (DUONEB) 0.5-2.5 (3) MG/3ML SOLN Take 3 mLs  by nebulization 2 (two) times daily.  . metoprolol succinate (TOPROL-XL) 50 MG 24 hr tablet Take 50 mg by mouth daily.   No facility-administered encounter medications on file as of 04/21/2018.     No Known Allergies-- HE IS VERY SENSITIVE TO ACE INHIBITORS AND SHOULD NOT RECEIVE THESE MEDS.   Immunization History  Administered Date(s) Administered  . Influenza,inj,Quad PF,6+ Mos 07/22/2017  . PPD Test 03/26/2014  . Tdap 04/11/2017  He has so far refused the 2 Pneumonia vaccinations- we continue to request that he get these shots...   Current Medications, Allergies, Past Medical History, Past Surgical History, Family History, and Social History were reviewed in [employer] record.   Review of Systems             All symptoms NEG except where BOLDED >>  Constitutional:  F/C/S, fatigue, anorexia, unexpected weight change. HEENT:  HA, visual changes, hearing loss, earache, nasal symptoms, sore throat, mouth sores, hoarseness. Resp:  cough, sputum, hemoptysis; SOB, tightness, wheezing. Cardio:  CP, palpit, DOE, orthopnea, edema. GI:  N/V/D/C, blood in stool; reflux, abd pain, distention, gas. GU:  dysuria, freq, urgency, hematuria, flank pain, voiding difficulty. MS:  joint pain, swelling, tenderness, decr ROM; neck pain, back pain, etc. Neuro:  HA, tremors, seizures, dizziness, syncope, weakness, numbness, gait abn. Skin:  suspicious lesions or skin rash. Heme:  adenopathy, bruising, bleeding. Psyche:  confusion, agitation, sleep disturbance, hallucinations, anxiety, depression suicidal.   Objective:   Physical Exam       Vital Signs:  Reviewed...   General:  WD,Thin, 72 y/o BM chr ill appearing, in NAD; alert & oriented; pleasant & cooperative, somewhat anxious... HEENT:  [place]/AT; Conjunctiva- pink, Sclera- nonicteric, EOM-wnl, PERRLA, EACs-wax, TMs-obscured; NOSE-clear; MOUTH- horrible appearance, edent, osteonecrosis of mandible & draining abscess from  left side. Neck:  Supple w/ fair ROM; no JVD; normal carotid impulses w/o bruits; no thyromegaly or nodules palpated; ? lymphadenopathy; +indurated thickened tissue Chest:  Decr BS bilat without wheezes, rales, or rhonchi heard. Heart:  Regular Rhythm; norm S1 & S2 without murmurs, rubs, or gallops detected. Abdomen:  Soft & nontender- no guarding or rebound; normal bowel sounds; no organomegaly or masses palpated, sm umbil hernia. Ext:  Abn posture &  unable to flex fingers 345 left hand w/ resting tremor; otherw fair ROM, +arthritic changes, +ven insuffic/ dermatitis, no edema... Neuro:  CNs II-XII intact; motor testing normal; +periph neuropathy, balance off... Derm:  No lesions noted; no rash etc. Lymph:  No cervical, supraclavicular, axillary, or inguinal adenopathy palpated.   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     2) Chemotherapy induced peripheral neuropathy>  [facility] symptoms in hands and feet reported to be improved w/ Cymbalta60/d and Neurontin300Tid (prev on 900Bid but he weaned it on his own) => they signed off 7 indicated that his PCP was taking over this issue..Marland KitchenMarland Kitchen    3) Radiation therapy induced osteonecrosis of the mandible=> NOW w/ DRAINING ABSCESS FROM LEFT SIDE--  [facility] & NOW NEEDS PROMPT ATTENTION BY HIS [facility] TEAM TO TREAT THE MANDIBULAR ABSCESS & R/O  OSTEOMYELITIS     4) COPD- mixed chr bronchitis & emphysema (seen on prev CT Chest)>      5) Cigarette smoker>  He quit transiently during his Chemoradiation but then restarted & currently smoking betw 1/2-1ppd;  He started smoking in teens, has smoked >50 yrs up to 2ppd w/ an est ~80 pack-yr smoking hx overall;  He went through a [facility] smoking cessation program but was unable to quit;  They continue CT Chest Q668[place]oor follow up evals...    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 as seen on prev CT scans    8) HBP, CAD> (atherosclerotic changes seen in ao & coronaries on CT Chest), Cath 03/2014 by DrVaranasi showed 50% midLAD stenosis, small Circ w/ 60% stenosis in large ramus branch, mild dis in dominant RCA=> placed on aggressive medical therapy & [facility] last seen 01/20/16> on ASA81, MetoprololER50, Lasix20-just taken prn edema; NOTE: he is very sens to Lisinopril w/ hypotension x2 in past;     9) Non-ischemic cardiomyopathy> w/ EF=20-25% on 2DEcho 03/2014 assoc w/ mildMR, modTR, PAsys=5270m;  His initial BNP was >23000;  [facility]55% w/ medical management (per 2DEcho 12/2014)...    10) Hyperlipidemia> on Atorva40>  ?last FLP 04/06/14 [facility] TChol 164, [facility]     11) Hx esophageal food impactions requiring EGD & endoscopic food removal by DrOutlaw 1/16 & 3/16 (believed due to eating large food bolus w/o chewing due to his dental issues, no underlying esoph pathology described); dietary adjustments and dental work recommended    12) Hx alcohol abuse & prot-cal malnutrition>  He has cut back drinking Etoh but still drinks-- serum proteins/ LFTs/ Alb are all wnl  now...    13) Dental problems>  He has dry mouth & uses Biotene...    14) HxAnemia>  His last CBC was Jan2016 w/ Hg=  10.3, renal function is wnl, and   PLAN >>  02/08/17>   We discussed immediate therapy w/ LEVAQUIN500Qd, MEDROL Dosepak, Quit all smoking, Hold ASA, Add the Breo one inhalation daily to the Spiriva daily;  OK to resume Hyperbaric treatments, and we plan ROV recheck in 2 wks.. 02/17/17>   Julian Zimmerman's congestion & hemoptysis have resolved w/ Levaquin & Medrol- now states breathing is at his baseline on Breo & Spiriva daily;  He MUST quit smoking (still at 1ppd) esp to save $$ and afford his meds;  They will get copy of his formulary & we can adjust once we see his coverage; we plan routine recheck in 52mo 04/20/17>   Julian Machowas adm w/ syncope- he was orthostatic & improved w/ IVF, no arrhythmias found, [facility] has applied a 14d event monitor for completeness; He is very sensitive to ACE inhibitors and should not receive these meds;  From the pulmonary standpoint he says his breathing is stable- asked to STOP ALL SMOKING, take the [place]once daily, gradually increase exercise program;  When asked wife notes that he snores some & is occas restless at night, pt feels that he rests well, and we discussed a screening HOME SLEEP TEST to r/o OSA. 10/21/17>   Julian Machocontinues to smoke, despite everything he's been thru;  He complains that the inhalers are too expensive, (in donut hole esp) making compliance poor- REC change to NEBULIZER w/ DUONEB Bid & BUDESONIDE 0.25 Bid;  He needs to [facility] mouth floor but also due to complications of chemotherapy induced peripheral neuropathy, and osteonecrosis of mandible;  We will plan pulmonary recheck in 612mo  04/21/18>   FrJosph Machoas a serious problem involving his left mandible- submandib abscess, r/o osteomyelitis of this bone, [facility] w/ consideration of ID consult, IV antibiotic options if needed, and Pt may need to reconsider his refusal for mandibulectomy and flap reconstruction  once this infection is under control;  We started pt on AUGMENTIN 87526mid for now... [facility]:     Patient's Medications  New Prescriptions   AMOXICILLIN-CLAVULANATE (AUGMENTIN) 875-125 MG TABLET    Take 1 tablet by mouth 2 (two) times daily.  Previous Medications   ASPIRIN EC 81 MG EC TABLET    Take 1 tablet (81 mg total) by mouth daily.   BUDESONIDE (PULMICORT) 0.25 MG/2ML NEBULIZER SOLUTION    Take 2 mLs (0.25 mg total) by nebulization 2 (two) times daily.   CHOLECALCIFEROL (VITAMIN D3) 1000 UNITS CAPS    Take 2,000 Units by mouth daily.    DULOXETINE (CYMBALTA) 30 MG CAPSULE    Take 1 capsule (30 mg total) by mouth daily.   IPRATROPIUM-ALBUTEROL (DUONEB) 0.5-2.5 (3) MG/3ML SOLN    Take 3 mLs by nebulization 2 (two) times daily.   METOPROLOL SUCCINATE (TOPROL-XL) 50 MG 24 HR TABLET    Take 50 mg by mouth daily.  Modified Medications   No medications on file  Discontinued Medications   No medications on file

## 2018-04-22 NOTE — Telephone Encounter (Signed)
Spoke with pt's wife, Julian Zimmerman. States that was instructed by Dr. Lenna Gilford to call in today. Dr. Lenna Gilford was to speak to Dr. Loletta Parish in regards to the pt. Dr. Romie Levee is an ENT Oncologist who is taking care of the pt. Julian Zimmerman is requesting that we send a copy of the pt's CXR, blood work and wound culture to Dr. Achilles Dunk office. She does not have this fax number but will contact us once she gets it. Julian Zimmerman has been speaking with a nurse navigator at Dr. Achilles Dunk office >> Nena Jordan [phone].  Will route to [facility] for follow up.

## 2018-04-22 NOTE — Telephone Encounter (Signed)
Per SN- Augmentin 875mg  #28, 1 PO BID, sent to [facility].  Prescription sent to pharmacy, Patient notified.  Nothing further at this time.

## 2018-04-24 LAB — WOUND CULTURE
MICRO NUMBER:: 90852084
SPECIMEN QUALITY: ADEQUATE

## 2018-04-27 ENCOUNTER — Other Ambulatory Visit: Payer: Self-pay | Admitting: *Deleted

## 2018-04-27 MED ORDER — IPRATROPIUM-ALBUTEROL 0.5-2.5 (3) MG/3ML IN SOLN: 3.0000 mL | Freq: Two times a day (BID) | RESPIRATORY_TRACT | 2 refills | Status: DC

## 2018-04-29 ENCOUNTER — Telehealth: Payer: Self-pay | Admitting: Pulmonary Disease

## 2018-04-29 NOTE — Telephone Encounter (Signed)
She states the wounds he has "are weeping pus".  She is aware Dr. Lenna Gilford is on vacation this week but I advised I would be marking this as urgent and a nurse would be returning her call.

## 2018-04-29 NOTE — Telephone Encounter (Signed)
Spoke with patient's wife Neoma Laming. She wanted to know if SN had had a chance to review Dr. Achilles Dunk note in [place]. Advised her that SN has been out of the office all week and will return on Monday. She verbalized understanding. She is ok with waiting until SN returns.   SN, please advise. Thanks!

## 2018-05-02 NOTE — Telephone Encounter (Signed)
Attempted to call patient today regarding how wound if doing since last week. I did not receive an answer at time of call. I have left a voicemail message for pt to return call. X1

## 2018-05-02 NOTE — Telephone Encounter (Signed)
Called and spoke with pt's wife-Julian Zimmerman at [phone] She advised that pt's would is still draining honey color puss, and she's concerned Pt's sister passed away 2 days ago, and is leaving out of town on 05/05/2018 for funeral  Pt's wife is calling Dr. Achilles Dunk office today to expedite infectious disease consult/referral Routing message to Lattie Haw to f/u with pt this week

## 2018-05-02 NOTE — Telephone Encounter (Signed)
Patient wife Julian Zimmerman returning call - she can be reached at [phone]

## 2018-05-04 NOTE — Telephone Encounter (Signed)
Spoke with Julian Zimmerman regarding pt's draining Advised to contact Dr. Achilles Dunk  Office for treatment plan and f/u Called spoke with pt's wife Julian Zimmerman to advise they need to call Dr. Achilles Dunk office Nothing SN office can do at this point Nothing further needed at this time.

## 2018-05-06 IMAGING — CR DG CHEST 2V
2 series · 2 of 2 positions shown · non-contrast
Comparison: Radiographs and CT 02/08/2017

CLINICAL DATA: Fall, laceration.

EXAM:
CHEST  2 VIEW

[chest lat]
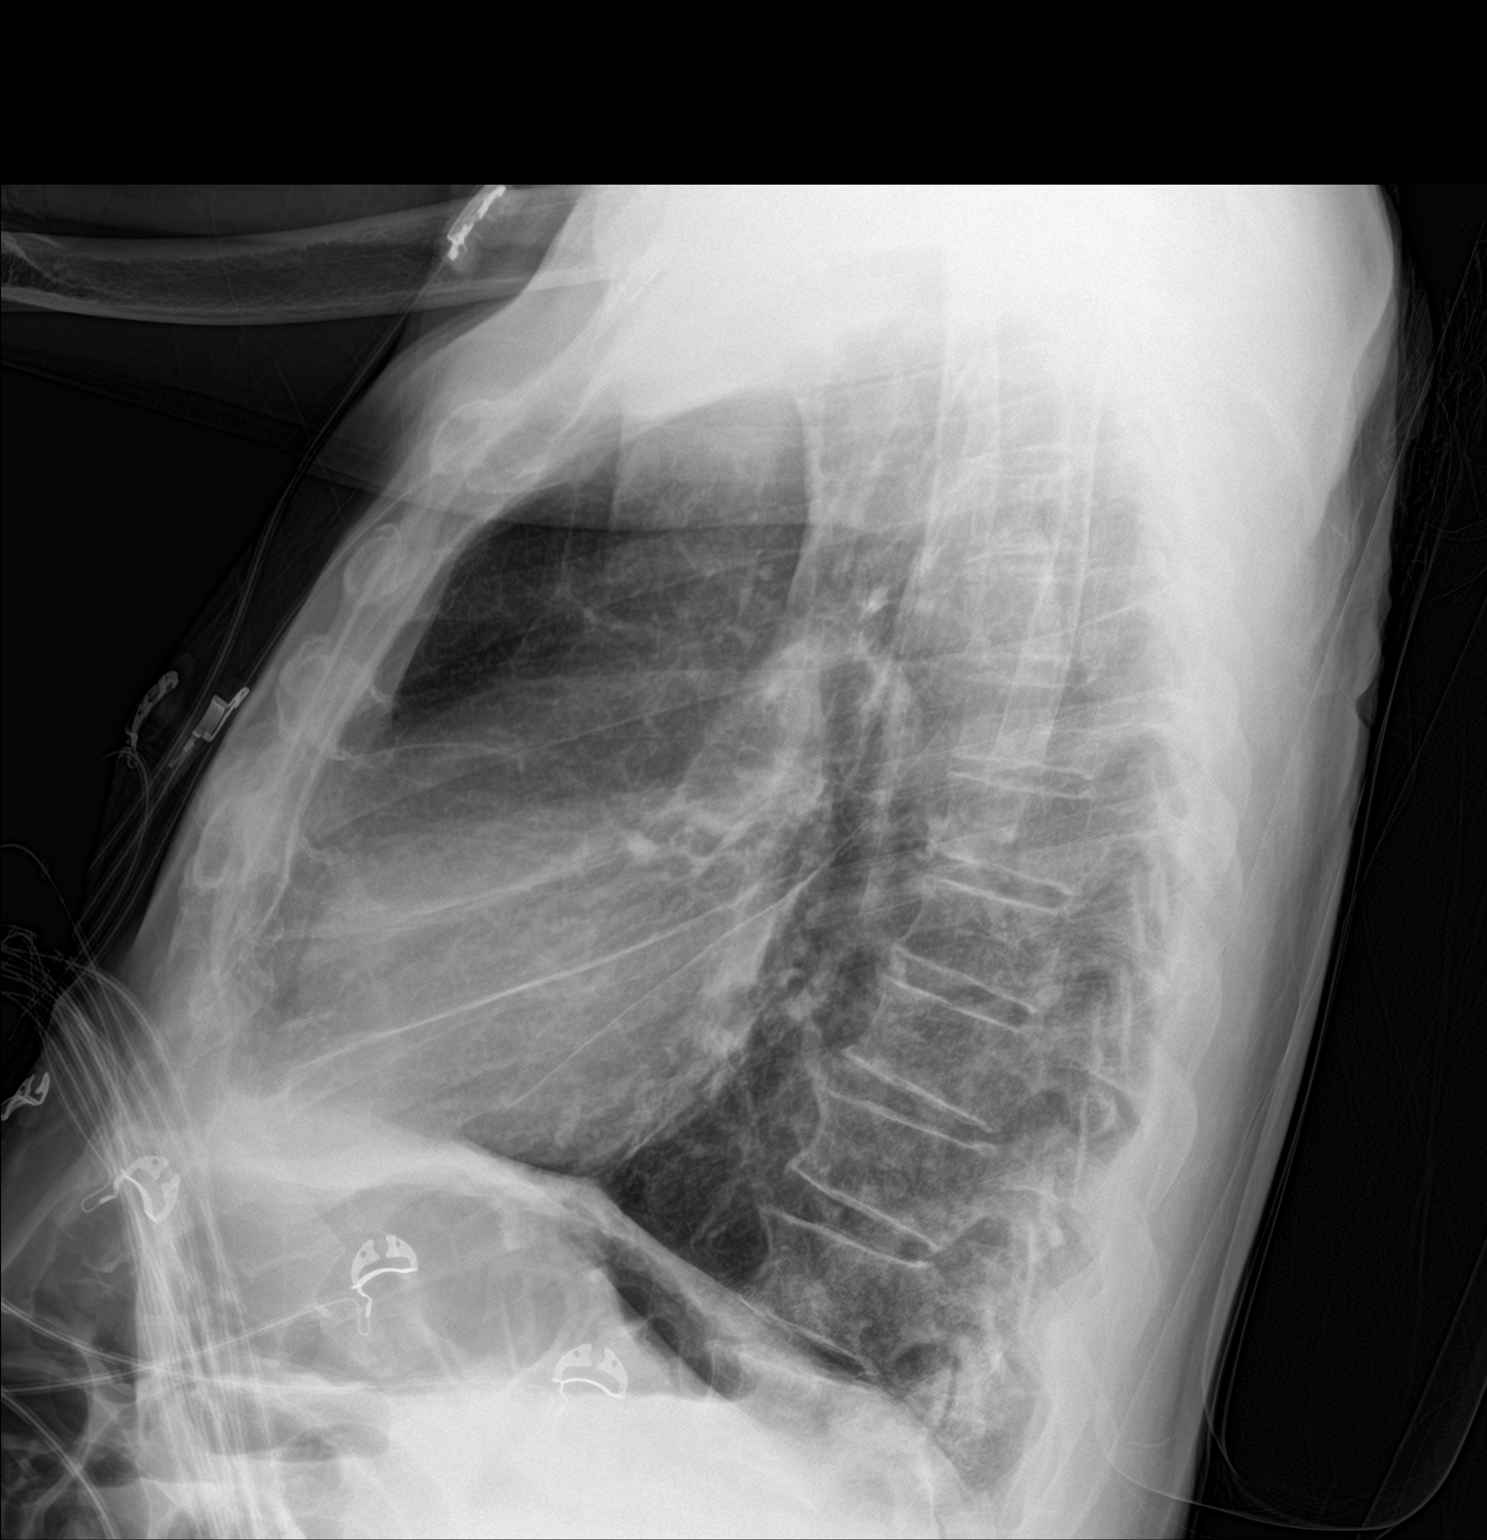

[chest ap]
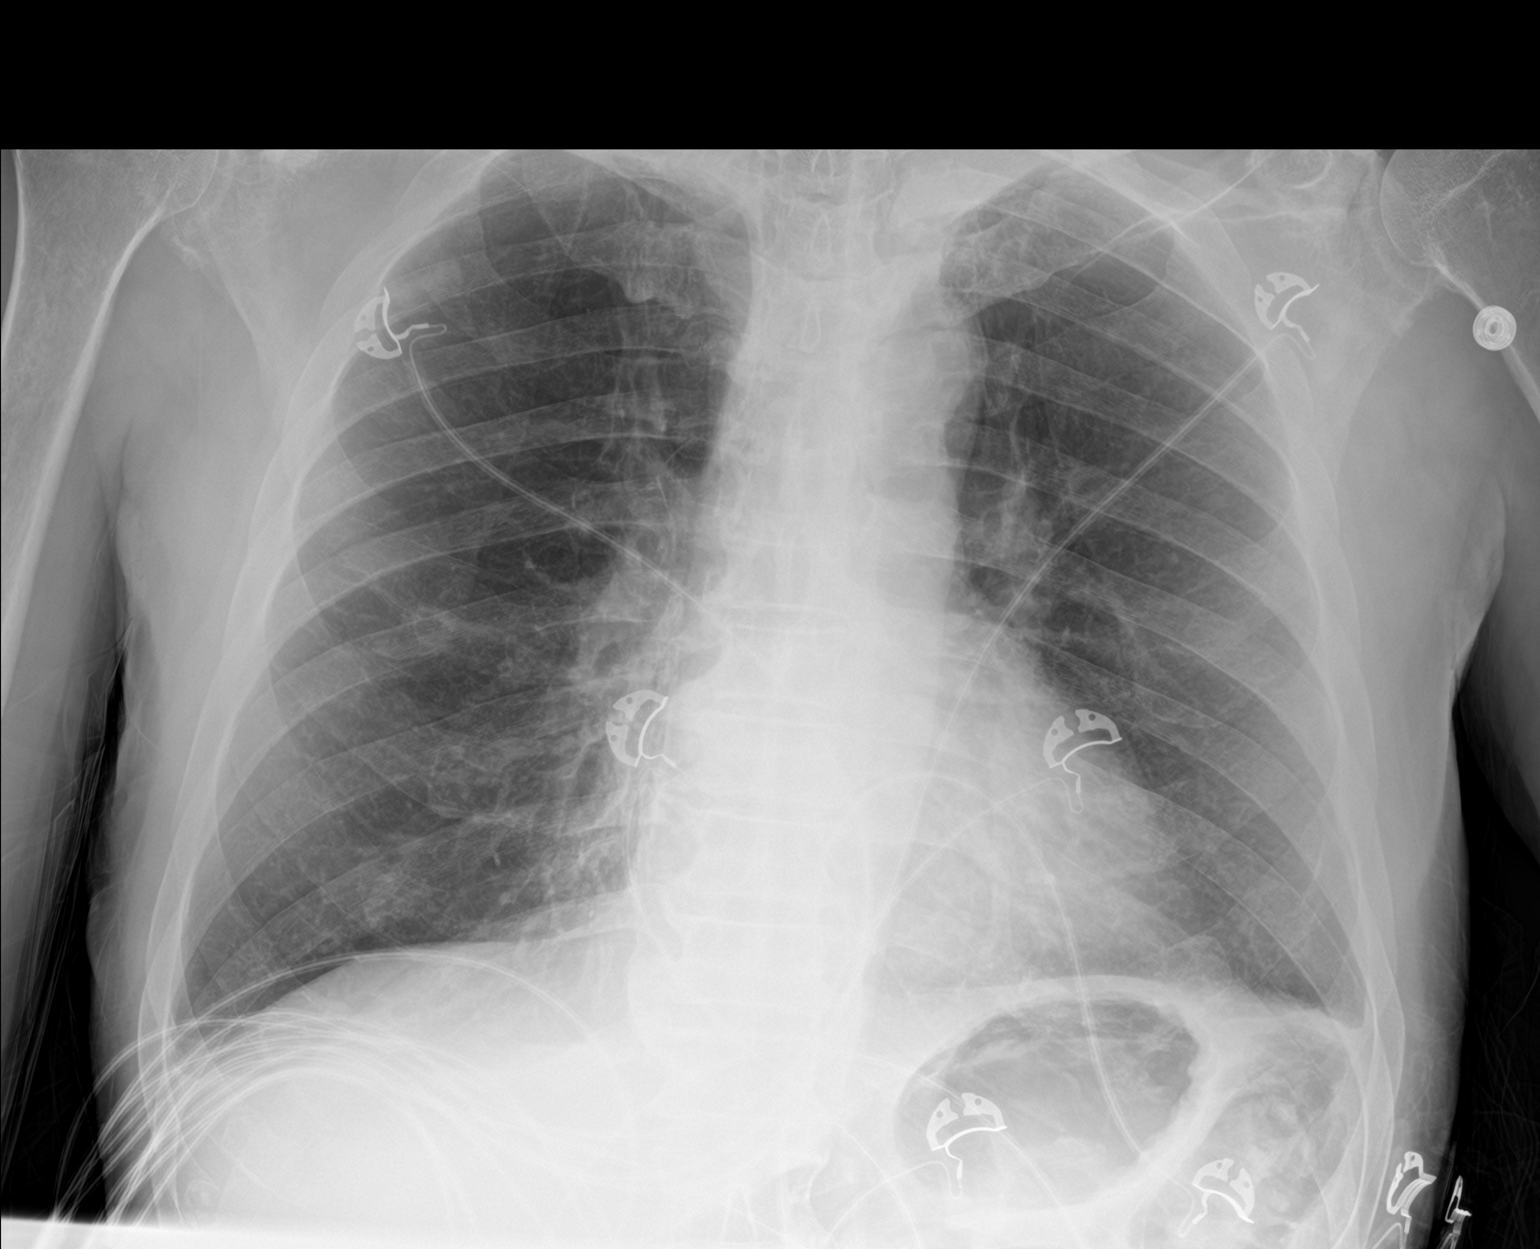

[2 of 2 positions shown; findings below may reference images not displayed]

FINDINGS: Again seen hyperinflation and emphysema. Unchanged heart size and
mediastinal contours with atherosclerosis of the thoracic aorta. No
pulmonary edema, consolidation, pleural fluid or pneumothorax. No
acute osseous abnormalities. Site of laceration is not seen
radiographically.
IMPRESSION: 1. No acute abnormality.
2. Emphysema and thoracic aortic atherosclerosis.

## 2018-05-09 DIAGNOSIS — I5021 Acute systolic (congestive) heart failure: Secondary | ICD-10-CM | POA: Diagnosis not present

## 2018-05-09 DIAGNOSIS — I429 Cardiomyopathy, unspecified: Secondary | ICD-10-CM | POA: Diagnosis not present

## 2018-05-09 DIAGNOSIS — R1310 Dysphagia, unspecified: Secondary | ICD-10-CM | POA: Diagnosis not present

## 2018-05-09 DIAGNOSIS — I5020 Unspecified systolic (congestive) heart failure: Secondary | ICD-10-CM | POA: Diagnosis not present

## 2018-05-09 DIAGNOSIS — M8730 Other secondary osteonecrosis, unspecified bone: Secondary | ICD-10-CM | POA: Diagnosis not present

## 2018-05-09 DIAGNOSIS — Z85818 Personal history of malignant neoplasm of other sites of lip, oral cavity, and pharynx: Secondary | ICD-10-CM | POA: Diagnosis not present

## 2018-05-09 DIAGNOSIS — J410 Simple chronic bronchitis: Secondary | ICD-10-CM | POA: Diagnosis not present

## 2018-05-09 DIAGNOSIS — Z7982 Long term (current) use of aspirin: Secondary | ICD-10-CM | POA: Diagnosis not present

## 2018-05-09 DIAGNOSIS — I2510 Atherosclerotic heart disease of native coronary artery without angina pectoris: Secondary | ICD-10-CM | POA: Diagnosis not present

## 2018-05-09 DIAGNOSIS — Z8249 Family history of ischemic heart disease and other diseases of the circulatory system: Secondary | ICD-10-CM | POA: Diagnosis not present

## 2018-05-09 DIAGNOSIS — Y842 Radiological procedure and radiotherapy as the cause of abnormal reaction of the patient, or of later complication, without mention of misadventure at the time of the procedure: Secondary | ICD-10-CM | POA: Diagnosis not present

## 2018-05-09 DIAGNOSIS — I110 Hypertensive heart disease with heart failure: Secondary | ICD-10-CM | POA: Diagnosis not present

## 2018-05-09 DIAGNOSIS — K117 Disturbances of salivary secretion: Secondary | ICD-10-CM | POA: Diagnosis not present

## 2018-05-09 DIAGNOSIS — Z7951 Long term (current) use of inhaled steroids: Secondary | ICD-10-CM | POA: Diagnosis not present

## 2018-05-09 DIAGNOSIS — C049 Malignant neoplasm of floor of mouth, unspecified: Secondary | ICD-10-CM | POA: Diagnosis not present

## 2018-05-09 DIAGNOSIS — I10 Essential (primary) hypertension: Secondary | ICD-10-CM | POA: Diagnosis not present

## 2018-05-09 DIAGNOSIS — F17210 Nicotine dependence, cigarettes, uncomplicated: Secondary | ICD-10-CM | POA: Diagnosis not present

## 2018-05-09 DIAGNOSIS — Z872 Personal history of diseases of the skin and subcutaneous tissue: Secondary | ICD-10-CM | POA: Diagnosis not present

## 2018-05-09 DIAGNOSIS — I255 Ischemic cardiomyopathy: Secondary | ICD-10-CM | POA: Diagnosis not present

## 2018-05-09 DIAGNOSIS — Z833 Family history of diabetes mellitus: Secondary | ICD-10-CM | POA: Diagnosis not present

## 2018-05-09 DIAGNOSIS — M272 Inflammatory conditions of jaws: Secondary | ICD-10-CM | POA: Diagnosis not present

## 2018-05-09 DIAGNOSIS — Z8619 Personal history of other infectious and parasitic diseases: Secondary | ICD-10-CM | POA: Diagnosis not present

## 2018-05-09 DIAGNOSIS — Z9229 Personal history of other drug therapy: Secondary | ICD-10-CM | POA: Diagnosis not present

## 2018-05-09 DIAGNOSIS — R2681 Unsteadiness on feet: Secondary | ICD-10-CM | POA: Diagnosis not present

## 2018-05-09 DIAGNOSIS — Z79899 Other long term (current) drug therapy: Secondary | ICD-10-CM | POA: Diagnosis not present

## 2018-05-09 DIAGNOSIS — Z681 Body mass index (BMI) 19 or less, adult: Secondary | ICD-10-CM | POA: Diagnosis not present

## 2018-05-19 ENCOUNTER — Other Ambulatory Visit: Payer: Self-pay | Admitting: *Deleted

## 2018-05-19 MED ORDER — IPRATROPIUM-ALBUTEROL 0.5-2.5 (3) MG/3ML IN SOLN: 3.0000 mL | Freq: Two times a day (BID) | RESPIRATORY_TRACT | 2 refills | Status: AC

## 2018-05-19 MED ORDER — BUDESONIDE 0.25 MG/2ML IN SUSP: 0.2500 mg | Freq: Two times a day (BID) | RESPIRATORY_TRACT | 12 refills | Status: AC

## 2018-05-20 ENCOUNTER — Other Ambulatory Visit: Payer: Self-pay

## 2018-05-20 NOTE — Patient Outreach (Signed)
[place] [facility]) Care Management  05/20/2018  Julian Zimmerman 24-Aug-1946 [id number]   Telephone Screen  Referral Date: 05/20/18 Referral Source: MD office Referral Reason: " COPD, HTN, CA, patient needs a nurse and social worker-needs assistance with ADLS, oral CA-abscess on mandible, neuropathy in hands and feet" Insurance: Medicare  Incoming call from patient's spouse. Per spouse patient has difficulty talking due to illness and requested call be completed with her.  Patient was able to give verbal consent to speak with spouse. Screening completed.  Social: Patient resides in his home along with his spouse. They have children but they all live out of state. Patient requires assistance with all ADLs/IADLs. Spouse voices that she is currently helping him with personal care as he will not let anyone else help. Patient occasionally drives but has been advised by MD he should not be doing so. Spouse takes him to appts. She voices that last fall was a few weeks ago at local gas station. He does not use any assistive devices. Spouse voices that patient was getting [facility] services but was discharged when they found out he was driving and not home bound. Spouse inquiring to speak with SW to discuss in home resources and options available to assist her care for patient in the home. She voices that she knows they do not quality for Medicaid as income too high.   Conditions: Per chart review, patient has PMH of smoker, gout, arthritis, CHF,CAD, peripheral neuropathy and oral cancer. 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 However, he has been having ongoing issues due to side effects of chemo/radiation treatments. Spouse voices he has "osteo radio necrosis to his jawbones" and is deteriorating and worsening. He also has right abscess to his chin/jaw area that is weeping and draining. He has been on long term antibiotics  for several months now. Patient is followed by [facility] oncology team. They are aware and has scheduled for patient to be seen by Infectious MD on 06/16/18. Spouse concerned about patient's condition continuing to worsen and is working on trying to get him seen sooner here locally. She is awaiting a call back from office regarding this. Spouse voices that patient is not one to complain but he has been having issues with pain mgmt and possibly some SOB. She also states that she has noticed patient is doing "chain smoking" for the past month and a half and thinks its related to him not feeling well and having symptoms he is not reporting. She also voices that patient is not eating and barely can talk.  Spouse becomes tearful during call as she shares she "is not doing enough" in her eyes as patient is not getting better. RN CM provided support and reassurance to spouse.  Medications: Per spouse, patient taking about nine meds. She denies any issues affording/managing meds at this time.  Appointments; Patient followed by PCP-Dr. Delfina Redwood and [facility] oncology and ENT team.   Advance Directives: Spouse voices that patient is a DNR.  Consent: [facility] services reviewed and discussed with spouse. Spouse voices she needs all the help she can get. Verbal consent for services provided by spouse.  Plan: RN CM will make [facility] SW referral for possible resources on in home support, community resources,etc to assist spouse. RN CM will make [facility] community nurse referral for further in home eval/assessment of care needs,mgmt of chronic conditions and symptom management.    Destynee Stringfellow  Tania Ade RN,BSN,CCM [place] [facility] Telephonic Care Management Coordinator Direct Phone: [phone] Toll Free: [phone] Fax: [phone]

## 2018-05-20 NOTE — Patient Outreach (Signed)
[facility]) Care Management  05/20/2018  Julian Zimmerman 01-18-1946 [id number]   Telephone Screen  Referral Date: 05/20/18 Referral Source: MD office Referral Reason: " COPD, HTN, CA, patient needs a nurse and social worker-needs assistance with ADLS, oral CA-abscess on mandible, neuropathy in hands and feet" Insurance: Medicare   Outreach attempt # 1  to patient. No answer at present. RN CM left HIPAA compliant voicemail message along with contact info.     Plan: RN CM will make outreach attempt to patient within 3-4 business days. RN CM will send unsuccessful outreach letter to patient.   Enzo Montgomery, RN,BSN,CCM [place] Management Telephonic Care Management Coordinator Direct Phone: [phone] Toll Free: [phone] Fax: [phone]

## 2018-05-24 ENCOUNTER — Other Ambulatory Visit: Payer: Self-pay

## 2018-05-24 ENCOUNTER — Other Ambulatory Visit: Payer: Self-pay | Admitting: *Deleted

## 2018-05-24 NOTE — Patient Outreach (Signed)
[facility]) Care Management  05/24/2018  Julian Zimmerman April 09, 1946 [id number]  BSW attempted to contact the patient on today's date to assist with community resources. Unfortunately the patient did not answer. BSW left a  HIPAA compliant voice message requesting a return call.  Plan: BSW will send patient an unsuccessful outreach letter requesting contact. BSW will attempt the patient again within the next four business days.  [name], BSW, CDP [facility] [phone]

## 2018-05-24 NOTE — Patient Outreach (Signed)
[place] [facility]) Care Management  05/24/2018  DELMO MATTY [birth year] [id number]    Initial outreach unsuccessful however RN able to leave a HIPAA approved voice message requesting a call back. Will verify or send outreach letter according to other involved disciplines and rescheduled another call within one week.  Raina Mina, RN Care Management Coordinator [place] Office [phone]

## 2018-05-25 ENCOUNTER — Other Ambulatory Visit: Payer: Self-pay

## 2018-05-25 NOTE — Patient Outreach (Signed)
[facility] [facility]) Care Management  05/25/2018  LOCHLIN EPPINGER September 14, 1946 [id number]  Return call to the patients wife, Aerik Polan, HIPAA identifiers confirmed. BSW introduced self to Mrs. Sangha and social work role within [place] [facility]. BSW discussed the patients recent referral to assist with caregiver resources. Mrs. Pense stated "I'm not sure what we need".   Mrs. 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 Unfortunately, the patient has had several side effects from the radiation and chemotherapy he received during his cancer treatments. The patient currently has adscesses located on his mandible which make it difficult for the patient to speak and eat. Mrs. Dietrick shared the patient has an appointment next Tuesday 8/27 with the infectious disease center at [facility]. Mrs. Mcgurn is hopeful this visit will lead to answers and a course of treatment for the patient.  Mrs. Campion shared she feels the patient could benefit from a caregiver but the patient is not willing to give up his independence. Mrs. Pomplun currently assists the patient as much as possible but states the patient "does not receive help well". Mrs. Steinberger reports the patient has difficulty eating due to jaw pain. Mrs. Vantine reports that although she purees the patients food some days "he doesn't have the energy to eat". Mrs. Dalziel also reports the patient experiences tremors and has difficulty controlling range of motion at times.  BSW spoke with Mrs. Burt Knack about [employer] [facility] [place] caregiver program. Mrs. Spilde is interested in learning more about this program as well as other senior centers in the area. BSW to mail the patient information on [employer] [facility] [place] including their caregiver program as well as the senior center located at the [employer] location. BSW also mailed the patient a newsletter from the [facility].  During today's call Mrs.  Nipp denied having Advanced Directives in place stating "I know he wants DNR". BSW educated Mrs. Vicars on the importance of an Forensic scientist. Mrs. Zheng agreeable to have [employer] packets to both the patient and his spouse for review. BSW will contact the patient in the next two weeks to confirm receipt of mailings. BSW will plan to review any questions during the next scheduled call. BSW has offered a home visit to assist with completion. At this time Mrs. Habib would like to receive the information in the mail and will request a home visit during the next scheduled call if she feels it is needed.  [name], BSW, CDP [facility] [phone]

## 2018-05-27 ENCOUNTER — Ambulatory Visit: Payer: Medicare Other

## 2018-05-27 ENCOUNTER — Other Ambulatory Visit: Payer: Self-pay | Admitting: *Deleted

## 2018-05-27 NOTE — Patient Outreach (Signed)
[place] [facility]) Care Management  05/27/2018  Julian Zimmerman Jul 08, 1946 [id number]  Outreach 3rd attempt  RN received a call and text from pt's spouse Julian Zimmerman) requested a call back. RN returned the call and spoke briefly with the primary caregiver spouse. RN requested to speak with the pt for confirmation however wife indicated they were both having supper at this time. RN offered to follow up another day and scheduled a call back for Monday at a specific time that would be convenient. Will inquired more on pt's needs for pending [facility] services at that time. Note wife indicated pt does not speak to well but she could put the phone on speak to assist with translation for identifiers at that time. Will precede accordingly as scheduled.  Raina Mina, RN Care Management Coordinator [place] Office [phone]

## 2018-05-27 NOTE — Patient Outreach (Signed)
[place] [facility]) Care Management  05/27/2018  Julian Zimmerman 02-16-1946 [id number]    Telephone Assessment  RN 2nd outreach attempt unsuccessful however RN able to leave a HIPAA approved voice message requesting a call back pending [facility] services. Will reschedule another call next week and allow pt time to respond to the outreach letter sent.  Raina Mina, RN Care Management Coordinator [place] Office [phone]

## 2018-05-30 ENCOUNTER — Other Ambulatory Visit: Payer: Self-pay | Admitting: *Deleted

## 2018-05-30 NOTE — Patient Outreach (Addendum)
[facility]) Care Management  05/30/2018  Julian Zimmerman 06-06-1946 [id number]    RN spoke with pt and caregiver spouse Julian Zimmerman) via speaker today telephonic and verified identifiers. RN explained the purpose for today's call and [facility] available services. Caregiver has several concerns and pending several referral awaiting medical treatment for the pt. States she has an appointment tomorrow with Infectious Disease in [place] [facility] for boils/abcesses to the right chin/neck area from recent radiation treatment that rid the cancer but destroy the bone structure of the mandible from oral cancer. Now pt is dealing with the aftermath of boils to the radiated areas that are constantly draining "white pus down his neck". Wife states pt has been on several antibotics however the wounds continue to weep on the left side and she is told to leave the wound open to air with no dressing or coverage of the wounds. This makes it difficult due to the constant drainage. Wife states this will be managed hopeful tomorrow on the referral to the ID provider. Another issues is pt pain to his mandible prevention pt from eating however caregiver has been fixing smoothes that pt is tolerating very well for his ongoing nutritional needs. Wife only had questions related to if there is something else she can do for the wounds "if I am treating them correctly", " If there is something else I can feed him". Based upon pt's medical condition RN encouraged the caregiver to pose these questions to the ID provider and pt's primary for possible consult for a nutritional. Caregiver also indicated pt is pending a referral to a local pain provider from [facility] for pain management to a local center. Currently awaiting a call from the referring agency.  Will generate a plan of care with goals and interventions until pt is active with the other agency for services related to Palliative care services, medications and  medical appointments based upon pt's ongoing issues.  RN discussed Palliative Care Program and available services for the ongoing medical issues this pt is encountering. Both the pt and caregiver has agreed to this referral for ongoing multi-symptom management. RN will contact this services with a referral. Will follow up with the pt and caregiver once the referral is complete or if further information is needed.  Again caregiver and pt very appreciative for the call and information provided today.  [facility] CM Care Plan Problem One     Most Recent Value  Care Plan Problem One  Palliative Care referral  Role Documenting the Problem One  Care Management [place] for Problem One  Active  [facility] Long Term Goal   Pt will received a palliative care referral over the next 31 days.  [facility] Long Term Goal Start Date  05/31/18  Interventions for Problem One Long Term Goal  Discussed available services via Palliative care and the 24/7 telephonic support available for services provided  [facility] CM Short Term Goal #1   Pt will attend all medical appointments over the next 30 days as scheduled.  [facility] CM Short Term Goal #1 Start Date  05/31/18  Interventions for Short Term Goal #1  Discused the importance or regulating his care with the ongoing medical appointments to accommodate his care  [facility] CM Short Term Goal #2   Pt will adhere to medication changes over the next 30 days in managing his ongong care.  [facility] CM Short Term Goal #2 Start Date  05/31/18  Interventions for Short Term Goal #  2  Discuss ongoing changes in his medications in order to accommodate pt's care with changes in his medicaitons       Raina Mina, RN Care Management Coordinator [place] Office [phone]

## 2018-05-31 ENCOUNTER — Other Ambulatory Visit: Payer: Self-pay | Admitting: *Deleted

## 2018-05-31 DIAGNOSIS — N400 Enlarged prostate without lower urinary tract symptoms: Secondary | ICD-10-CM | POA: Diagnosis not present

## 2018-05-31 DIAGNOSIS — Y842 Radiological procedure and radiotherapy as the cause of abnormal reaction of the patient, or of later complication, without mention of misadventure at the time of the procedure: Secondary | ICD-10-CM | POA: Diagnosis not present

## 2018-05-31 DIAGNOSIS — J449 Chronic obstructive pulmonary disease, unspecified: Secondary | ICD-10-CM | POA: Diagnosis not present

## 2018-05-31 DIAGNOSIS — Z681 Body mass index (BMI) 19 or less, adult: Secondary | ICD-10-CM | POA: Diagnosis not present

## 2018-05-31 DIAGNOSIS — M272 Inflammatory conditions of jaws: Secondary | ICD-10-CM | POA: Diagnosis not present

## 2018-05-31 NOTE — Patient Outreach (Signed)
[place] [facility]) Care Management  05/31/2018  Julian Zimmerman Jun 27, 1946 [id number]    RN made the requested referral for Palliative Care Program through [facility]. Contacted Dr. Lina Sar office and left a message with [name] concerning this referral. Also spoke with [name] at [facility] along with contacts and the message left to [facility] for this referral. Currently awaiting Dr. Delfina Redwood office to call back to confirm.   Raina Mina, RN Care Management Coordinator [place] Office [phone]

## 2018-06-02 ENCOUNTER — Other Ambulatory Visit: Payer: Self-pay | Admitting: *Deleted

## 2018-06-02 NOTE — Patient Outreach (Signed)
[place] [facility]) Care Management  06/02/2018  Julian Zimmerman 02-15-1946 [id number]    RN received a call from [facility] via Palliative care program for in home services with [facility] indicating pt's provider has sent over the proper referral for this pt. States she will forward this information for a home visit to be arranged. RN requested pt's and caregiver be called with an update on the progress and provided a mobile number if pt is not available in the home due to all the pending appointments. RN also contacted Inez Catalina who makes the HV arrangements and requested a call back to provide additional clinical information on this pt's.WIll continue attempts to reach clinical for updated report on this pt for the home visiting nurse with Palliative care.  Plan to discharge pt once the initial HV has taken place and pt is active with this service.  Raina Mina, RN Care Management Coordinator [place] Office [phone]

## 2018-06-03 ENCOUNTER — Ambulatory Visit: Payer: Self-pay | Admitting: *Deleted

## 2018-06-07 ENCOUNTER — Other Ambulatory Visit: Payer: Self-pay | Admitting: *Deleted

## 2018-06-07 NOTE — Patient Outreach (Signed)
[place] [facility]) Care Management  06/07/2018  Julian Zimmerman [birth year] [id number]    RN attempted outreach call concerning Palliative Care involvement and pt's disposition with [facility] however no answer as RN will reschedule and attempt to reconnect with pt/caregiver concerning the start of services with [place]. Will follow up accordingly.  Raina Mina, RN Care Management Coordinator [place] Office [phone]

## 2018-06-09 ENCOUNTER — Ambulatory Visit: Payer: Self-pay

## 2018-06-09 ENCOUNTER — Other Ambulatory Visit: Payer: Self-pay

## 2018-06-09 ENCOUNTER — Telehealth: Payer: Self-pay | Admitting: Licensed Clinical Social Worker

## 2018-06-09 NOTE — Patient Outreach (Signed)
[place] [facility]) Care Management  06/09/2018  Julian Zimmerman Mar 31, 1946 [id number]  Unsuccessful outreach to the patient to confirm receipt of resources. BSW left the patient a HIPAA compliant voice message requesting a return call. BSW to outreach the patient within the next four business days if no return call is received.  [name], BSW, CDP [facility] [phone]

## 2018-06-09 NOTE — Telephone Encounter (Signed)
Palliative Care SW left a message on wife, Deborah's, cell phone and home phone, to set up a visit.

## 2018-06-09 NOTE — Patient Outreach (Signed)
[facility]) Care Management  06/09/2018  Julian Zimmerman Jan 22, 1946 [id number]  Incoming call from the patients wife, [name], HIPAA identifiers confirmed. [name] is able to confirm receipt of advance directive packet. [name] states she and the patient have reviewed the documents but have yet to complete them due to being busy. BSW reviewed these documents telephonically and answered all questions both the patient and [name] had regarding these forms. BSW further explained that once the documents were notarized to distribute copies to all healthcare providers as well as those the patient and [name] choose to list as healthcare agents. Mrs. Glogowski stated understanding.  During today's call [name] stated the patient was seen by Dr. Elease Hashimoto with infectious disease at [facility]. [name] stated Dr. Beather Arbour agreed with Dr. Achilles Dunk plan stating the patient would need surgery to correct the abscessed areas. [name] is hesitant to schedule this surgery due to the patient's infection. [name] reports the patient was prescribed Penicillin QID x 6 weeks then Penicillin BID once the 6 week course is completed.   [name] also reports Dr. Romie Levee was to refer the patient to the pain clinic. [name] has yet to hear from the pain clinic in [place] to establish an appointment. [name] is concerned about the amount of pain the patient is in. [name] reports not hearing from the pain clinic yet but states she did call and was informed "they will call when a referral is received".   [name] has also yet to hear from [place]. [name] gave this BSW permission to contact [place] to follow up on the status of the patients referral.   BSW explained to [name] that advance directive goals have been achieved and this BSW would no longer plan to contact the patient or [name]. Mrs.  Vangilder understands [facility] RNCM Raina Mina will continue to be involved. [name] also understands she may contact BSW if future social work needs arise.  BSW contacted [name] with Palliative care of [facility] who confirmed the patients referral was received. [name] reports there is no intake currently scheduled for the patient. [name] reports she will contact [name] to follow up on the status of the patients intake. BSW to update [facility] RNCM Raina Mina of discipline closure.  [name], BSW, CDP [facility] [phone]

## 2018-06-10 ENCOUNTER — Other Ambulatory Visit: Payer: Self-pay | Admitting: *Deleted

## 2018-06-10 NOTE — Patient Outreach (Signed)
[place] [facility]) Care Management  06/10/2018  JAMILL WETMORE 1946-09-12 [id number]    Telephone Assessment  RN spoke with pt's spouse and verified identifiers and inquired on the referral via Palliative Care. Spouse indicated they have been playing "phone tag" with back and forth call but has not arrange the initial home visit yet. RN offered to intervene and contact the agency with the spouse's cell number (agreed). RN discussed pt's disposition with [facility] until the services has completed the initial HV prior to discharging via [facility] services. Spouse appreciates that plan. Also discussed the current plan of care and update interventions accordingly based upon the pending services and referral to pt's providers and possible changes to medications based upon those consults.  RN contacted [place] and currently awaiting a call back to provide the pt's spouse's cell number for arranging the HV.   Will follow up with pt/spouse next week on the progress with this referral. No other inquires or request at this time as wife very appreciative for the calls and services rendered at this time. Caregiver continue to agree with the plan of care and interventions discussed today.   [facility] CM Care Plan Problem One     Most Recent Value  Care Plan Problem One  Palliative Care referral  Role Documenting the Problem One  Care Management [place] for Problem One  Active  [facility] Long Term Goal   Pt will received a palliative care referral over the next 31 days.  [facility] Long Term Goal Start Date  05/31/18  Interventions for Problem One Long Term Goal  Currently agency and pt attempt to reach each other for HV arrangements. Will intervene and allow Palliative Care of the pt's cell for easier communication. Will extend to allow contact with the referring agency.   [facility] CM Short Term Goal #1   Pt will attend all medical appointments over the next 30 days as scheduled.  [facility] CM  Short Term Goal #1 Start Date  05/31/18  Interventions for Short Term Goal #1  Verified attendance with no delays as pt continues to have pending appts related. Will extend to goal to allow adherence with upcoming referral related to these appointments.   [facility] CM Short Term Goal #2   Pt will adhere to medication changes over the next 30 days in managing his ongong care.  [facility] CM Short Term Goal #2 Start Date  05/31/18  Interventions for Short Term Goal #2  No issues however pending upcoming appointments that may change some of the pt's medications. Will extend this goal to allow adherence to those changes and re-evaluate next week      Raina Mina, RN Care Management Coordinator [place] Office [phone]

## 2018-06-14 ENCOUNTER — Ambulatory Visit: Payer: Self-pay

## 2018-06-15 ENCOUNTER — Other Ambulatory Visit: Payer: Self-pay | Admitting: *Deleted

## 2018-06-15 NOTE — Patient Outreach (Signed)
[place] [facility]) Care Management  06/15/2018  Julian Zimmerman 22-Mar-1946 [id number]  RN received a call back from the assigned nurse that will be visiting this pt once communication has been establish with [place] [facility] [place]. RN confirmed identifier on this pt and provider helpful information with another contact number for the pt's spouse to arrange the pending home visit with this pt. RN provider times within the day that agency can best speak with the pt's spouse to make such arrangements.   Raina Mina, RN Care Management Coordinator [place] Office [phone]  Addendum: RN received a message back that a home visit was scheduled for this Friday 9/13 @ 0930. Will confirm visit and plan to close this case once visits has been completed by [facility].

## 2018-06-17 ENCOUNTER — Other Ambulatory Visit: Payer: Self-pay | Admitting: Licensed Clinical Social Worker

## 2018-06-17 ENCOUNTER — Other Ambulatory Visit: Payer: Self-pay | Admitting: *Deleted

## 2018-06-17 DIAGNOSIS — Z515 Encounter for palliative care: Secondary | ICD-10-CM

## 2018-06-20 ENCOUNTER — Other Ambulatory Visit: Payer: Self-pay | Admitting: *Deleted

## 2018-06-20 NOTE — Progress Notes (Signed)
COMMUNITY PALLIATIVE CARE RN NOTE  PATIENT NAME: JAYLEEN AFONSO DOB: [birth year] MRN: [id number]  PRIMARY CARE PROVIDER: Seward Carol, MD  RESPONSIBLE PARTY:  Acct ID - Guarantor Home Phone Work Phone Relationship Acct Type  [id number] - Trella,[name]* [phone]  Self P/F     [place], [place], [place] [address]    PLAN OF CARE and INTERVENTION:  1. ADVANCE CARE PLANNING/GOALS OF CARE: Wife wants patient to remain at home in her care for as long as she is physically able, avoid hospitalizations 2. PATIENT/CAREGIVER EDUCATION: Explained Palliative Care Services, Pain Management 3. DISEASE STATUS: Joint visit made with [facility]. Patient lying on the couch downstairs asleep upon arrival, but did join [place] upstairs at start of visit. He denies pain while at rest, but states that he does experience pain when he is eating and/or speaking. Wife reports patient has abscesses in both sides of his mouth, currently being treated with oral antibiotics. She feels that the abscesses are improving, as evidence by decreased facial swelling, however patient states pain remains the same. He is currently taking Tylenol and wife is also administering Meloxicam which was given to him for a R knee pain issue. 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 He's tried Gabapentin in the past but this was stopped. Wife can not recall the reason why. Wife states often times he will not admit to being in pain, but she can tell by his facial expressions. He is able to swallow his medications whole at this time, but unable to eat regular foods. Mainly drinking Boost, Ensure and Smoothies for nutrition. He has tried Pureed foods, however this is more difficult to consume than liquids. Patient is very thin, frail and wife reports continued weight loss. Wife states he has lost 30 lbs since January. He was referred to [facility], however waiting list is about 2 months and wife is awaiting a call back  for appointment date/time. She is noticing that he is sleeping more throughout the day. He was unable to complete entire visit today, as he became tired and wanted to lie down. He remains able to bathe, dress, ambulate and feed himself independently. He also continues to drive. He ambulates with a very wide gait for more stability. He continues to smoke. Reviewed DNR, MOST form and Living Will documents and provided instructions on each. She wants to speak with patient prior to completing forms. Will continue to monitor.  HISTORY OF PRESENT ILLNESS:  This is a 72 yo male who resides at home with his wife. Palliative Care Team was asked to follow patient to assist with goals of care and symptom management.   CODE STATUS: FULL CODE ADVANCED DIRECTIVES: N MOST FORM: no PPS: 60%   PHYSICAL EXAM:   LUNGS: clear to auscultation  CARDIAC: Cor RRR EXTREMITIES: No edema SKIN: Thin,frail and dark in appearance. No wounds or skin breakdown at this time  NEURO: Alert and oriented x 3, generalized weakness, ambulatory   (Duration of visit and documentation 90 minutes)    Daryl Eastern, RN, BSN

## 2018-06-20 NOTE — Patient Outreach (Signed)
[facility] [facility]) Care Management  06/20/2018  Julian Zimmerman Jul 30, 1946 [id number]  Case Closure  RN received a call from [facility]) on Friday afternoon via voice message indicated pt has been visited by representative of [facility] with a joine rn and social work [place]. Stated on the VM that the [facility]  RN NP would be getting involved to assist pt with pain management pending consult from provider's office in [place]. States they will be addressing his nutritional needs and again his pain management.  Therefore pt has been attending all medical appointments, taking all his medications to control his pain and was receptive to [facility] and Palliative care of [place].   RN has attempted to contact the pt to confirm the above however only able to leave a HIPAA approved voice message. Will discharge this pt based upon the reported received from [facility] of pt being active with this service.  Addendum: RN received a call back from the pt's spouse who confirmed the home visit vis [facility] along with the involved caregivers (RN/SW/NP). States she feels better knowing they are assisting pt with getting the care needed concerning his pain management and nutritional issues. Spouse very grateful for RN case manager intervening with this referral services. No other inquires or request at this time as RN will close this case from further services.   Raina Mina, RN Care Management Coordinator [place] Office [phone]

## 2018-06-20 NOTE — Progress Notes (Signed)
COMMUNITY PALLIATIVE CARE SW NOTE  PATIENT NAME: Julian Zimmerman DOB: Feb 27, 1946 MRN: [id number]  PRIMARY CARE PROVIDER: Seward Carol, MD  RESPONSIBLE PARTY:  Acct ID - Guarantor Home Phone Work Phone Relationship Acct Type  [id number] - Ditullio,[name]  Self P/F     [place], [place], [place] [address]     PLAN OF CARE and INTERVENTIONS:             1. GOALS OF CARE/ ADVANCE CARE PLANNING:  Patient's wife, Julian Zimmerman, wants patient's pain controlled and for him to eat better. 2. SOCIAL/EMOTIONAL/SPIRITUAL ASSESSMENT/ INTERVENTIONS:  SW and Palliative Care RN, Daryl Eastern, met with patient and his wife.  Patient was born in [place].  He met his wife while they were attending college in [place].  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  Julian Zimmerman reported they moved several times while he was with [employer]. They have three children that do no live in the state, and have been married for 48 years.  Patient reported no pain currently, but does have jaw pain.  RN to address.  Wife reports patient wants to remain independent and still drives. 3. PATIENT/CAREGIVER EDUCATION/ COPING:  Provided education regarding Palliative Care, MOST form, and advance directives.  Patient's wife reported he will express his needs. 4. PERSONAL EMERGENCY PLAN:  To further discuss with patient and his wife. 5. COMMUNITY RESOURCES COORDINATION/ HEALTH CARE NAVIGATION:  None. 6. FINANCIAL/LEGAL CONCERNS/INTERVENTIONS:  None.     SOCIAL HX:  Social History   Tobacco Use  . Smoking status: Current Every Day Smoker    Packs/day: 1.00    Years: 50.00    Pack years: 50.00    Types: Cigarettes  . Smokeless tobacco: Never Used  Substance Use Topics  . Alcohol use: Yes    Alcohol/week: 28.0 standard drinks    Types: 28 Shots of liquor per week    Comment: 03/23/2014 "4, 1oz shots/day"    CODE STATUS:  Full Code  ADVANCED DIRECTIVES: N MOST FORM COMPLETE:  N HOSPICE EDUCATION PROVIDED:  N PPS:   Patient's appetite has decreased because his jaw hurts.  He is able to stand and ambulate independently. Duration of visit and documentation:  105 minutes.      Creola Corn Aniello Christopoulos, LCSW

## 2018-06-21 ENCOUNTER — Telehealth: Payer: Self-pay | Admitting: *Deleted

## 2018-06-21 NOTE — Telephone Encounter (Signed)
Contacted [facility] and spoke with the New Referral Division to inquire about Referral made to them by Dr. Romie Levee ENT with [facility]. Clinic has received required documentation from [facility] and this case was placed into review with the clinic doctors in order to decide if this is a case that is appropriate for them to take. May take 1-2 weeks before final decision is made. If case is accepted, they will contact patient and make an appointment. If not accepted, they will inform [facility] of this. Contacted patient's wife Neoma Laming to advise of above noted information. She also informed that patient has decided not to become a DNR. He wants to remain a FULL CODE and on the MOST form wants Full Scope of Treatment, antibiotics, IVFs and feeding tube if indicated. Wife appreciative of information.

## 2018-06-24 ENCOUNTER — Other Ambulatory Visit: Payer: Self-pay | Admitting: Licensed Clinical Social Worker

## 2018-06-24 DIAGNOSIS — Z515 Encounter for palliative care: Secondary | ICD-10-CM

## 2018-06-24 DIAGNOSIS — N400 Enlarged prostate without lower urinary tract symptoms: Secondary | ICD-10-CM | POA: Diagnosis not present

## 2018-06-24 DIAGNOSIS — J449 Chronic obstructive pulmonary disease, unspecified: Secondary | ICD-10-CM | POA: Diagnosis not present

## 2018-06-26 NOTE — Progress Notes (Addendum)
COMMUNITY PALLIATIVE CARE SW NOTE  PATIENT NAME: Julian Zimmerman DOB: April 27, 1946 MRN: [id number]  PRIMARY CARE PROVIDER: Seward Carol, MD  RESPONSIBLE PARTY:  Acct ID - Guarantor Home Phone Work Phone Relationship Acct Type  [id number] - [name],[name]  Self P/F     [place], [place], [place] [address]     PLAN OF CARE and INTERVENTIONS:             1. GOALS OF CARE/ ADVANCE CARE PLANNING:  Patient's wife, Julian Zimmerman, wants pain controlled and for him to eat better.  Patient is a Full Code with a MOST form. 2. SOCIAL/EMOTIONAL/SPIRITUAL ASSESSMENT/ INTERVENTIONS:  SW met with patient and his wife, Julian Zimmerman, to further discuss code status.  Patient's wife had patient's living will completed.  She expressed needing counseling regarding her husband's illness.  SW to meet with her next week to provide.  Discussed various aspects of the MOST form. 3. PATIENT/CAREGIVER EDUCATION/ COPING:  Patient copes by expressing himself as needed.  SW provided additional information regarding the MOST form per wife's request. 4. PERSONAL EMERGENCY PLAN:  Patient sleeps to conserve energy.  Patient's wife will contact family members also. 5. COMMUNITY RESOURCES COORDINATION/ HEALTH CARE NAVIGATION:  None 6. FINANCIAL/LEGAL CONCERNS/INTERVENTIONS:  None     SOCIAL HX:  Social History   Tobacco Use  . Smoking status: Current Every Day Smoker    Packs/day: 1.00    Years: 50.00    Pack years: 50.00    Types: Cigarettes  . Smokeless tobacco: Never Used  Substance Use Topics  . Alcohol use: Yes    Alcohol/week: 28.0 standard drinks    Types: 28 Shots of liquor per week    Comment: 03/23/2014 "4, 1oz shots/day"    CODE STATUS:  Full Code  ADVANCED DIRECTIVES:  Living Will MOST FORM COMPLETE:  Yes HOSPICE EDUCATION PROVIDED:  No  PPS:  Patient's appetite has decreased due to jaw pain.  Patient is able to stand and ambulate independently. Duration of visit and documentation:  50  minutes     Creola Corn Tierre Netto, LCSW

## 2018-06-28 ENCOUNTER — Other Ambulatory Visit: Payer: Self-pay | Admitting: Licensed Clinical Social Worker

## 2018-06-28 DIAGNOSIS — Z515 Encounter for palliative care: Secondary | ICD-10-CM

## 2018-06-29 NOTE — Progress Notes (Signed)
COMMUNITY PALLIATIVE CARE SW NOTE  PATIENT NAME: Julian Zimmerman DOB: March 25, 1946 MRN: [id number]  PRIMARY CARE PROVIDER: Seward Carol, MD  RESPONSIBLE PARTY:  Acct ID - Guarantor Home Phone Work Phone Relationship Acct Type  [id number] - [name] [phone]  Self P/F     [address], [place], [place] [address]     PLAN OF CARE and INTERVENTIONS:             1. GOALS OF CARE/ ADVANCE CARE PLANNING:  For patient's pain to be controlled and for him to eat more.  Patient has a MOST form and is a Full Code. 2. SOCIAL/EMOTIONAL/SPIRITUAL ASSESSMENT/ INTERVENTIONS:  SW met with patient's wife, Neoma Laming, at the [facility] office per her request.  SW provided active listening while she expressed her increased caregiver stress.  She expressed a deep connection with her faith and her belief in prayer.  She talked about Doyle throwing about a few meals and saying that he ate them.  When Neoma Laming expressed concern about the use alcohol and medication, patient stopped taking all of his medication on Sunday.  SW informed Palliative Care RN.  Explored the couples communication styles during their marriage.  She stated she felt stress relief after the visit. 3. PATIENT/CAREGIVER EDUCATION/ COPING:  Patient's wife copes by seeking support from professionals.  Provided education regarding disease progression. 4. PERSONAL EMERGENCY PLAN:  Patient will inform his wife with medical concerns.  He currently smokes in the house and is aware of the safety risk. 5. COMMUNITY RESOURCES COORDINATION/ HEALTH CARE NAVIGATION:  None 6. FINANCIAL/LEGAL CONCERNS/INTERVENTIONS:  None     SOCIAL HX:  Social History   Tobacco Use  . Smoking status: Current Every Day Smoker    Packs/day: 1.00    Years: 50.00    Pack years: 50.00    Types: Cigarettes  . Smokeless tobacco: Never Used  Substance Use Topics  . Alcohol use: Yes    Alcohol/week: 28.0 standard drinks    Types: 28 Shots of liquor per  week    Comment: 03/23/2014 "4, 1oz shots/day"    CODE STATUS:  Full Code  ADVANCED DIRECTIVES:  Living Will MOST FORM COMPLETE:  Y HOSPICE EDUCATION PROVIDED:  N PPS:  Patient's appetite has decreased.  He is able to stand and ambulate independently, but he is unsteady. Duration of visit and documentation:  75 minutes.      Creola Corn Alannis Hsia, LCSW

## 2018-07-12 ENCOUNTER — Telehealth: Payer: Self-pay | Admitting: *Deleted

## 2018-07-12 DIAGNOSIS — Y842 Radiological procedure and radiotherapy as the cause of abnormal reaction of the patient, or of later complication, without mention of misadventure at the time of the procedure: Secondary | ICD-10-CM | POA: Diagnosis not present

## 2018-07-12 DIAGNOSIS — F17210 Nicotine dependence, cigarettes, uncomplicated: Secondary | ICD-10-CM | POA: Diagnosis not present

## 2018-07-12 DIAGNOSIS — R1310 Dysphagia, unspecified: Secondary | ICD-10-CM | POA: Diagnosis not present

## 2018-07-12 DIAGNOSIS — Z923 Personal history of irradiation: Secondary | ICD-10-CM | POA: Diagnosis not present

## 2018-07-12 DIAGNOSIS — R627 Adult failure to thrive: Secondary | ICD-10-CM | POA: Diagnosis not present

## 2018-07-12 DIAGNOSIS — C049 Malignant neoplasm of floor of mouth, unspecified: Secondary | ICD-10-CM | POA: Diagnosis not present

## 2018-07-12 DIAGNOSIS — M8730 Other secondary osteonecrosis, unspecified bone: Secondary | ICD-10-CM | POA: Diagnosis not present

## 2018-07-12 DIAGNOSIS — R64 Cachexia: Secondary | ICD-10-CM | POA: Diagnosis not present

## 2018-07-12 DIAGNOSIS — Z9221 Personal history of antineoplastic chemotherapy: Secondary | ICD-10-CM | POA: Diagnosis not present

## 2018-07-12 DIAGNOSIS — Z681 Body mass index (BMI) 19 or less, adult: Secondary | ICD-10-CM | POA: Diagnosis not present

## 2018-07-12 NOTE — Telephone Encounter (Signed)
Contacted and left a voicemail with patient's wife Neoma Laming to schedule a home visit. Provided contact information for return call.

## 2018-07-13 DIAGNOSIS — N400 Enlarged prostate without lower urinary tract symptoms: Secondary | ICD-10-CM | POA: Diagnosis not present

## 2018-07-13 DIAGNOSIS — J449 Chronic obstructive pulmonary disease, unspecified: Secondary | ICD-10-CM | POA: Diagnosis not present

## 2018-07-25 DIAGNOSIS — I5022 Chronic systolic (congestive) heart failure: Secondary | ICD-10-CM | POA: Diagnosis not present

## 2018-07-25 DIAGNOSIS — Z681 Body mass index (BMI) 19 or less, adult: Secondary | ICD-10-CM | POA: Diagnosis not present

## 2018-07-26 DIAGNOSIS — C049 Malignant neoplasm of floor of mouth, unspecified: Secondary | ICD-10-CM | POA: Diagnosis not present

## 2018-07-26 DIAGNOSIS — I509 Heart failure, unspecified: Secondary | ICD-10-CM | POA: Diagnosis not present

## 2018-07-26 DIAGNOSIS — K219 Gastro-esophageal reflux disease without esophagitis: Secondary | ICD-10-CM | POA: Diagnosis not present

## 2018-07-26 DIAGNOSIS — J449 Chronic obstructive pulmonary disease, unspecified: Secondary | ICD-10-CM | POA: Diagnosis not present

## 2018-07-26 DIAGNOSIS — I2510 Atherosclerotic heart disease of native coronary artery without angina pectoris: Secondary | ICD-10-CM | POA: Diagnosis not present

## 2018-07-26 DIAGNOSIS — I10 Essential (primary) hypertension: Secondary | ICD-10-CM | POA: Diagnosis not present

## 2018-07-26 DIAGNOSIS — E46 Unspecified protein-calorie malnutrition: Secondary | ICD-10-CM | POA: Diagnosis not present

## 2018-07-27 DIAGNOSIS — I2510 Atherosclerotic heart disease of native coronary artery without angina pectoris: Secondary | ICD-10-CM | POA: Diagnosis not present

## 2018-07-27 DIAGNOSIS — C049 Malignant neoplasm of floor of mouth, unspecified: Secondary | ICD-10-CM | POA: Diagnosis not present

## 2018-07-27 DIAGNOSIS — K219 Gastro-esophageal reflux disease without esophagitis: Secondary | ICD-10-CM | POA: Diagnosis not present

## 2018-07-27 DIAGNOSIS — J449 Chronic obstructive pulmonary disease, unspecified: Secondary | ICD-10-CM | POA: Diagnosis not present

## 2018-07-27 DIAGNOSIS — I509 Heart failure, unspecified: Secondary | ICD-10-CM | POA: Diagnosis not present

## 2018-07-27 DIAGNOSIS — I10 Essential (primary) hypertension: Secondary | ICD-10-CM | POA: Diagnosis not present

## 2018-07-29 DIAGNOSIS — K219 Gastro-esophageal reflux disease without esophagitis: Secondary | ICD-10-CM | POA: Diagnosis not present

## 2018-07-29 DIAGNOSIS — J449 Chronic obstructive pulmonary disease, unspecified: Secondary | ICD-10-CM | POA: Diagnosis not present

## 2018-07-29 DIAGNOSIS — C049 Malignant neoplasm of floor of mouth, unspecified: Secondary | ICD-10-CM | POA: Diagnosis not present

## 2018-07-29 DIAGNOSIS — I10 Essential (primary) hypertension: Secondary | ICD-10-CM | POA: Diagnosis not present

## 2018-07-29 DIAGNOSIS — I509 Heart failure, unspecified: Secondary | ICD-10-CM | POA: Diagnosis not present

## 2018-07-29 DIAGNOSIS — I2510 Atherosclerotic heart disease of native coronary artery without angina pectoris: Secondary | ICD-10-CM | POA: Diagnosis not present

## 2018-08-01 DIAGNOSIS — J449 Chronic obstructive pulmonary disease, unspecified: Secondary | ICD-10-CM | POA: Diagnosis not present

## 2018-08-01 DIAGNOSIS — C049 Malignant neoplasm of floor of mouth, unspecified: Secondary | ICD-10-CM | POA: Diagnosis not present

## 2018-08-01 DIAGNOSIS — K219 Gastro-esophageal reflux disease without esophagitis: Secondary | ICD-10-CM | POA: Diagnosis not present

## 2018-08-01 DIAGNOSIS — I10 Essential (primary) hypertension: Secondary | ICD-10-CM | POA: Diagnosis not present

## 2018-08-01 DIAGNOSIS — I2510 Atherosclerotic heart disease of native coronary artery without angina pectoris: Secondary | ICD-10-CM | POA: Diagnosis not present

## 2018-08-01 DIAGNOSIS — I509 Heart failure, unspecified: Secondary | ICD-10-CM | POA: Diagnosis not present

## 2018-08-03 DIAGNOSIS — I10 Essential (primary) hypertension: Secondary | ICD-10-CM | POA: Diagnosis not present

## 2018-08-03 DIAGNOSIS — I2510 Atherosclerotic heart disease of native coronary artery without angina pectoris: Secondary | ICD-10-CM | POA: Diagnosis not present

## 2018-08-03 DIAGNOSIS — C049 Malignant neoplasm of floor of mouth, unspecified: Secondary | ICD-10-CM | POA: Diagnosis not present

## 2018-08-03 DIAGNOSIS — J449 Chronic obstructive pulmonary disease, unspecified: Secondary | ICD-10-CM | POA: Diagnosis not present

## 2018-08-03 DIAGNOSIS — I509 Heart failure, unspecified: Secondary | ICD-10-CM | POA: Diagnosis not present

## 2018-08-03 DIAGNOSIS — K219 Gastro-esophageal reflux disease without esophagitis: Secondary | ICD-10-CM | POA: Diagnosis not present

## 2018-08-04 DIAGNOSIS — I2510 Atherosclerotic heart disease of native coronary artery without angina pectoris: Secondary | ICD-10-CM | POA: Diagnosis not present

## 2018-08-04 DIAGNOSIS — I509 Heart failure, unspecified: Secondary | ICD-10-CM | POA: Diagnosis not present

## 2018-08-04 DIAGNOSIS — K219 Gastro-esophageal reflux disease without esophagitis: Secondary | ICD-10-CM | POA: Diagnosis not present

## 2018-08-04 DIAGNOSIS — C049 Malignant neoplasm of floor of mouth, unspecified: Secondary | ICD-10-CM | POA: Diagnosis not present

## 2018-08-04 DIAGNOSIS — J449 Chronic obstructive pulmonary disease, unspecified: Secondary | ICD-10-CM | POA: Diagnosis not present

## 2018-08-04 DIAGNOSIS — I10 Essential (primary) hypertension: Secondary | ICD-10-CM | POA: Diagnosis not present

## 2018-08-05 DIAGNOSIS — K219 Gastro-esophageal reflux disease without esophagitis: Secondary | ICD-10-CM | POA: Diagnosis not present

## 2018-08-05 DIAGNOSIS — E46 Unspecified protein-calorie malnutrition: Secondary | ICD-10-CM | POA: Diagnosis not present

## 2018-08-05 DIAGNOSIS — I2510 Atherosclerotic heart disease of native coronary artery without angina pectoris: Secondary | ICD-10-CM | POA: Diagnosis not present

## 2018-08-05 DIAGNOSIS — J449 Chronic obstructive pulmonary disease, unspecified: Secondary | ICD-10-CM | POA: Diagnosis not present

## 2018-08-05 DIAGNOSIS — I509 Heart failure, unspecified: Secondary | ICD-10-CM | POA: Diagnosis not present

## 2018-08-05 DIAGNOSIS — I10 Essential (primary) hypertension: Secondary | ICD-10-CM | POA: Diagnosis not present

## 2018-08-05 DIAGNOSIS — C049 Malignant neoplasm of floor of mouth, unspecified: Secondary | ICD-10-CM | POA: Diagnosis not present

## 2018-08-08 DIAGNOSIS — J449 Chronic obstructive pulmonary disease, unspecified: Secondary | ICD-10-CM | POA: Diagnosis not present

## 2018-08-08 DIAGNOSIS — I509 Heart failure, unspecified: Secondary | ICD-10-CM | POA: Diagnosis not present

## 2018-08-08 DIAGNOSIS — I2510 Atherosclerotic heart disease of native coronary artery without angina pectoris: Secondary | ICD-10-CM | POA: Diagnosis not present

## 2018-08-08 DIAGNOSIS — I10 Essential (primary) hypertension: Secondary | ICD-10-CM | POA: Diagnosis not present

## 2018-08-08 DIAGNOSIS — C049 Malignant neoplasm of floor of mouth, unspecified: Secondary | ICD-10-CM | POA: Diagnosis not present

## 2018-08-08 DIAGNOSIS — K219 Gastro-esophageal reflux disease without esophagitis: Secondary | ICD-10-CM | POA: Diagnosis not present

## 2018-08-12 DIAGNOSIS — C049 Malignant neoplasm of floor of mouth, unspecified: Secondary | ICD-10-CM | POA: Diagnosis not present

## 2018-08-12 DIAGNOSIS — I509 Heart failure, unspecified: Secondary | ICD-10-CM | POA: Diagnosis not present

## 2018-08-12 DIAGNOSIS — J449 Chronic obstructive pulmonary disease, unspecified: Secondary | ICD-10-CM | POA: Diagnosis not present

## 2018-08-12 DIAGNOSIS — I2510 Atherosclerotic heart disease of native coronary artery without angina pectoris: Secondary | ICD-10-CM | POA: Diagnosis not present

## 2018-08-12 DIAGNOSIS — K219 Gastro-esophageal reflux disease without esophagitis: Secondary | ICD-10-CM | POA: Diagnosis not present

## 2018-08-12 DIAGNOSIS — I10 Essential (primary) hypertension: Secondary | ICD-10-CM | POA: Diagnosis not present

## 2018-08-15 DIAGNOSIS — K219 Gastro-esophageal reflux disease without esophagitis: Secondary | ICD-10-CM | POA: Diagnosis not present

## 2018-08-15 DIAGNOSIS — J449 Chronic obstructive pulmonary disease, unspecified: Secondary | ICD-10-CM | POA: Diagnosis not present

## 2018-08-15 DIAGNOSIS — I2510 Atherosclerotic heart disease of native coronary artery without angina pectoris: Secondary | ICD-10-CM | POA: Diagnosis not present

## 2018-08-15 DIAGNOSIS — C049 Malignant neoplasm of floor of mouth, unspecified: Secondary | ICD-10-CM | POA: Diagnosis not present

## 2018-08-15 DIAGNOSIS — I10 Essential (primary) hypertension: Secondary | ICD-10-CM | POA: Diagnosis not present

## 2018-08-15 DIAGNOSIS — I509 Heart failure, unspecified: Secondary | ICD-10-CM | POA: Diagnosis not present

## 2018-08-18 DIAGNOSIS — J449 Chronic obstructive pulmonary disease, unspecified: Secondary | ICD-10-CM | POA: Diagnosis not present

## 2018-08-18 DIAGNOSIS — I509 Heart failure, unspecified: Secondary | ICD-10-CM | POA: Diagnosis not present

## 2018-08-18 DIAGNOSIS — C049 Malignant neoplasm of floor of mouth, unspecified: Secondary | ICD-10-CM | POA: Diagnosis not present

## 2018-08-18 DIAGNOSIS — I2510 Atherosclerotic heart disease of native coronary artery without angina pectoris: Secondary | ICD-10-CM | POA: Diagnosis not present

## 2018-08-18 DIAGNOSIS — I10 Essential (primary) hypertension: Secondary | ICD-10-CM | POA: Diagnosis not present

## 2018-08-18 DIAGNOSIS — K219 Gastro-esophageal reflux disease without esophagitis: Secondary | ICD-10-CM | POA: Diagnosis not present

## 2018-08-23 DIAGNOSIS — J449 Chronic obstructive pulmonary disease, unspecified: Secondary | ICD-10-CM | POA: Diagnosis not present

## 2018-08-23 DIAGNOSIS — C049 Malignant neoplasm of floor of mouth, unspecified: Secondary | ICD-10-CM | POA: Diagnosis not present

## 2018-08-23 DIAGNOSIS — I10 Essential (primary) hypertension: Secondary | ICD-10-CM | POA: Diagnosis not present

## 2018-08-23 DIAGNOSIS — I2510 Atherosclerotic heart disease of native coronary artery without angina pectoris: Secondary | ICD-10-CM | POA: Diagnosis not present

## 2018-08-23 DIAGNOSIS — K219 Gastro-esophageal reflux disease without esophagitis: Secondary | ICD-10-CM | POA: Diagnosis not present

## 2018-08-23 DIAGNOSIS — I509 Heart failure, unspecified: Secondary | ICD-10-CM | POA: Diagnosis not present

## 2018-08-26 DIAGNOSIS — J449 Chronic obstructive pulmonary disease, unspecified: Secondary | ICD-10-CM | POA: Diagnosis not present

## 2018-08-26 DIAGNOSIS — K219 Gastro-esophageal reflux disease without esophagitis: Secondary | ICD-10-CM | POA: Diagnosis not present

## 2018-08-26 DIAGNOSIS — I10 Essential (primary) hypertension: Secondary | ICD-10-CM | POA: Diagnosis not present

## 2018-08-26 DIAGNOSIS — I2510 Atherosclerotic heart disease of native coronary artery without angina pectoris: Secondary | ICD-10-CM | POA: Diagnosis not present

## 2018-08-26 DIAGNOSIS — C049 Malignant neoplasm of floor of mouth, unspecified: Secondary | ICD-10-CM | POA: Diagnosis not present

## 2018-08-26 DIAGNOSIS — I509 Heart failure, unspecified: Secondary | ICD-10-CM | POA: Diagnosis not present

## 2018-08-29 DIAGNOSIS — I509 Heart failure, unspecified: Secondary | ICD-10-CM | POA: Diagnosis not present

## 2018-08-29 DIAGNOSIS — C049 Malignant neoplasm of floor of mouth, unspecified: Secondary | ICD-10-CM | POA: Diagnosis not present

## 2018-08-29 DIAGNOSIS — I10 Essential (primary) hypertension: Secondary | ICD-10-CM | POA: Diagnosis not present

## 2018-08-29 DIAGNOSIS — K219 Gastro-esophageal reflux disease without esophagitis: Secondary | ICD-10-CM | POA: Diagnosis not present

## 2018-08-29 DIAGNOSIS — J449 Chronic obstructive pulmonary disease, unspecified: Secondary | ICD-10-CM | POA: Diagnosis not present

## 2018-08-29 DIAGNOSIS — I2510 Atherosclerotic heart disease of native coronary artery without angina pectoris: Secondary | ICD-10-CM | POA: Diagnosis not present

## 2018-09-04 DIAGNOSIS — I2510 Atherosclerotic heart disease of native coronary artery without angina pectoris: Secondary | ICD-10-CM | POA: Diagnosis not present

## 2018-09-04 DIAGNOSIS — E46 Unspecified protein-calorie malnutrition: Secondary | ICD-10-CM | POA: Diagnosis not present

## 2018-09-04 DIAGNOSIS — C049 Malignant neoplasm of floor of mouth, unspecified: Secondary | ICD-10-CM | POA: Diagnosis not present

## 2018-09-04 DIAGNOSIS — I10 Essential (primary) hypertension: Secondary | ICD-10-CM | POA: Diagnosis not present

## 2018-09-04 DIAGNOSIS — K219 Gastro-esophageal reflux disease without esophagitis: Secondary | ICD-10-CM | POA: Diagnosis not present

## 2018-09-04 DIAGNOSIS — J449 Chronic obstructive pulmonary disease, unspecified: Secondary | ICD-10-CM | POA: Diagnosis not present

## 2018-09-04 DIAGNOSIS — I509 Heart failure, unspecified: Secondary | ICD-10-CM | POA: Diagnosis not present

## 2018-09-05 ENCOUNTER — Ambulatory Visit: Payer: Medicare Other | Admitting: Neurology

## 2018-09-05 DIAGNOSIS — C049 Malignant neoplasm of floor of mouth, unspecified: Secondary | ICD-10-CM | POA: Diagnosis not present

## 2018-09-05 DIAGNOSIS — I2510 Atherosclerotic heart disease of native coronary artery without angina pectoris: Secondary | ICD-10-CM | POA: Diagnosis not present

## 2018-09-05 DIAGNOSIS — I10 Essential (primary) hypertension: Secondary | ICD-10-CM | POA: Diagnosis not present

## 2018-09-05 DIAGNOSIS — K219 Gastro-esophageal reflux disease without esophagitis: Secondary | ICD-10-CM | POA: Diagnosis not present

## 2018-09-05 DIAGNOSIS — I509 Heart failure, unspecified: Secondary | ICD-10-CM | POA: Diagnosis not present

## 2018-09-05 DIAGNOSIS — J449 Chronic obstructive pulmonary disease, unspecified: Secondary | ICD-10-CM | POA: Diagnosis not present

## 2018-09-06 DIAGNOSIS — I509 Heart failure, unspecified: Secondary | ICD-10-CM | POA: Diagnosis not present

## 2018-09-06 DIAGNOSIS — J449 Chronic obstructive pulmonary disease, unspecified: Secondary | ICD-10-CM | POA: Diagnosis not present

## 2018-09-06 DIAGNOSIS — I10 Essential (primary) hypertension: Secondary | ICD-10-CM | POA: Diagnosis not present

## 2018-09-06 DIAGNOSIS — K219 Gastro-esophageal reflux disease without esophagitis: Secondary | ICD-10-CM | POA: Diagnosis not present

## 2018-09-06 DIAGNOSIS — C049 Malignant neoplasm of floor of mouth, unspecified: Secondary | ICD-10-CM | POA: Diagnosis not present

## 2018-09-06 DIAGNOSIS — I2510 Atherosclerotic heart disease of native coronary artery without angina pectoris: Secondary | ICD-10-CM | POA: Diagnosis not present

## 2018-09-12 DIAGNOSIS — I10 Essential (primary) hypertension: Secondary | ICD-10-CM | POA: Diagnosis not present

## 2018-09-12 DIAGNOSIS — C049 Malignant neoplasm of floor of mouth, unspecified: Secondary | ICD-10-CM | POA: Diagnosis not present

## 2018-09-12 DIAGNOSIS — I509 Heart failure, unspecified: Secondary | ICD-10-CM | POA: Diagnosis not present

## 2018-09-12 DIAGNOSIS — J449 Chronic obstructive pulmonary disease, unspecified: Secondary | ICD-10-CM | POA: Diagnosis not present

## 2018-09-12 DIAGNOSIS — I2510 Atherosclerotic heart disease of native coronary artery without angina pectoris: Secondary | ICD-10-CM | POA: Diagnosis not present

## 2018-09-12 DIAGNOSIS — K219 Gastro-esophageal reflux disease without esophagitis: Secondary | ICD-10-CM | POA: Diagnosis not present

## 2018-09-19 DIAGNOSIS — K219 Gastro-esophageal reflux disease without esophagitis: Secondary | ICD-10-CM | POA: Diagnosis not present

## 2018-09-19 DIAGNOSIS — I2510 Atherosclerotic heart disease of native coronary artery without angina pectoris: Secondary | ICD-10-CM | POA: Diagnosis not present

## 2018-09-19 DIAGNOSIS — I509 Heart failure, unspecified: Secondary | ICD-10-CM | POA: Diagnosis not present

## 2018-09-19 DIAGNOSIS — J449 Chronic obstructive pulmonary disease, unspecified: Secondary | ICD-10-CM | POA: Diagnosis not present

## 2018-09-19 DIAGNOSIS — I10 Essential (primary) hypertension: Secondary | ICD-10-CM | POA: Diagnosis not present

## 2018-09-19 DIAGNOSIS — C049 Malignant neoplasm of floor of mouth, unspecified: Secondary | ICD-10-CM | POA: Diagnosis not present

## 2018-09-20 DIAGNOSIS — I2510 Atherosclerotic heart disease of native coronary artery without angina pectoris: Secondary | ICD-10-CM | POA: Diagnosis not present

## 2018-09-20 DIAGNOSIS — I10 Essential (primary) hypertension: Secondary | ICD-10-CM | POA: Diagnosis not present

## 2018-09-20 DIAGNOSIS — I509 Heart failure, unspecified: Secondary | ICD-10-CM | POA: Diagnosis not present

## 2018-09-20 DIAGNOSIS — K219 Gastro-esophageal reflux disease without esophagitis: Secondary | ICD-10-CM | POA: Diagnosis not present

## 2018-09-20 DIAGNOSIS — J449 Chronic obstructive pulmonary disease, unspecified: Secondary | ICD-10-CM | POA: Diagnosis not present

## 2018-09-20 DIAGNOSIS — C049 Malignant neoplasm of floor of mouth, unspecified: Secondary | ICD-10-CM | POA: Diagnosis not present

## 2018-09-23 DIAGNOSIS — I2510 Atherosclerotic heart disease of native coronary artery without angina pectoris: Secondary | ICD-10-CM | POA: Diagnosis not present

## 2018-09-23 DIAGNOSIS — I509 Heart failure, unspecified: Secondary | ICD-10-CM | POA: Diagnosis not present

## 2018-09-23 DIAGNOSIS — C049 Malignant neoplasm of floor of mouth, unspecified: Secondary | ICD-10-CM | POA: Diagnosis not present

## 2018-09-23 DIAGNOSIS — K219 Gastro-esophageal reflux disease without esophagitis: Secondary | ICD-10-CM | POA: Diagnosis not present

## 2018-09-23 DIAGNOSIS — I10 Essential (primary) hypertension: Secondary | ICD-10-CM | POA: Diagnosis not present

## 2018-09-23 DIAGNOSIS — J449 Chronic obstructive pulmonary disease, unspecified: Secondary | ICD-10-CM | POA: Diagnosis not present

## 2018-09-26 DIAGNOSIS — I2510 Atherosclerotic heart disease of native coronary artery without angina pectoris: Secondary | ICD-10-CM | POA: Diagnosis not present

## 2018-09-26 DIAGNOSIS — J449 Chronic obstructive pulmonary disease, unspecified: Secondary | ICD-10-CM | POA: Diagnosis not present

## 2018-09-26 DIAGNOSIS — I509 Heart failure, unspecified: Secondary | ICD-10-CM | POA: Diagnosis not present

## 2018-09-26 DIAGNOSIS — K219 Gastro-esophageal reflux disease without esophagitis: Secondary | ICD-10-CM | POA: Diagnosis not present

## 2018-09-26 DIAGNOSIS — I10 Essential (primary) hypertension: Secondary | ICD-10-CM | POA: Diagnosis not present

## 2018-09-26 DIAGNOSIS — C049 Malignant neoplasm of floor of mouth, unspecified: Secondary | ICD-10-CM | POA: Diagnosis not present

## 2018-10-03 DIAGNOSIS — I2510 Atherosclerotic heart disease of native coronary artery without angina pectoris: Secondary | ICD-10-CM | POA: Diagnosis not present

## 2018-10-03 DIAGNOSIS — I509 Heart failure, unspecified: Secondary | ICD-10-CM | POA: Diagnosis not present

## 2018-10-03 DIAGNOSIS — K219 Gastro-esophageal reflux disease without esophagitis: Secondary | ICD-10-CM | POA: Diagnosis not present

## 2018-10-03 DIAGNOSIS — C049 Malignant neoplasm of floor of mouth, unspecified: Secondary | ICD-10-CM | POA: Diagnosis not present

## 2018-10-03 DIAGNOSIS — J449 Chronic obstructive pulmonary disease, unspecified: Secondary | ICD-10-CM | POA: Diagnosis not present

## 2018-10-03 DIAGNOSIS — I10 Essential (primary) hypertension: Secondary | ICD-10-CM | POA: Diagnosis not present

## 2018-10-05 DIAGNOSIS — C049 Malignant neoplasm of floor of mouth, unspecified: Secondary | ICD-10-CM | POA: Diagnosis not present

## 2018-10-05 DIAGNOSIS — J449 Chronic obstructive pulmonary disease, unspecified: Secondary | ICD-10-CM | POA: Diagnosis not present

## 2018-10-05 DIAGNOSIS — E46 Unspecified protein-calorie malnutrition: Secondary | ICD-10-CM | POA: Diagnosis not present

## 2018-10-05 DIAGNOSIS — I509 Heart failure, unspecified: Secondary | ICD-10-CM | POA: Diagnosis not present

## 2018-10-05 DIAGNOSIS — I2510 Atherosclerotic heart disease of native coronary artery without angina pectoris: Secondary | ICD-10-CM | POA: Diagnosis not present

## 2018-10-05 DIAGNOSIS — I10 Essential (primary) hypertension: Secondary | ICD-10-CM | POA: Diagnosis not present

## 2018-10-05 DIAGNOSIS — K219 Gastro-esophageal reflux disease without esophagitis: Secondary | ICD-10-CM | POA: Diagnosis not present

## 2018-10-07 DIAGNOSIS — K219 Gastro-esophageal reflux disease without esophagitis: Secondary | ICD-10-CM | POA: Diagnosis not present

## 2018-10-07 DIAGNOSIS — I2510 Atherosclerotic heart disease of native coronary artery without angina pectoris: Secondary | ICD-10-CM | POA: Diagnosis not present

## 2018-10-07 DIAGNOSIS — C049 Malignant neoplasm of floor of mouth, unspecified: Secondary | ICD-10-CM | POA: Diagnosis not present

## 2018-10-07 DIAGNOSIS — I509 Heart failure, unspecified: Secondary | ICD-10-CM | POA: Diagnosis not present

## 2018-10-07 DIAGNOSIS — J449 Chronic obstructive pulmonary disease, unspecified: Secondary | ICD-10-CM | POA: Diagnosis not present

## 2018-10-07 DIAGNOSIS — I10 Essential (primary) hypertension: Secondary | ICD-10-CM | POA: Diagnosis not present

## 2018-10-11 DIAGNOSIS — I509 Heart failure, unspecified: Secondary | ICD-10-CM | POA: Diagnosis not present

## 2018-10-11 DIAGNOSIS — I10 Essential (primary) hypertension: Secondary | ICD-10-CM | POA: Diagnosis not present

## 2018-10-11 DIAGNOSIS — J449 Chronic obstructive pulmonary disease, unspecified: Secondary | ICD-10-CM | POA: Diagnosis not present

## 2018-10-11 DIAGNOSIS — C049 Malignant neoplasm of floor of mouth, unspecified: Secondary | ICD-10-CM | POA: Diagnosis not present

## 2018-10-11 DIAGNOSIS — K219 Gastro-esophageal reflux disease without esophagitis: Secondary | ICD-10-CM | POA: Diagnosis not present

## 2018-10-11 DIAGNOSIS — I2510 Atherosclerotic heart disease of native coronary artery without angina pectoris: Secondary | ICD-10-CM | POA: Diagnosis not present

## 2018-10-14 DIAGNOSIS — I509 Heart failure, unspecified: Secondary | ICD-10-CM | POA: Diagnosis not present

## 2018-10-14 DIAGNOSIS — I10 Essential (primary) hypertension: Secondary | ICD-10-CM | POA: Diagnosis not present

## 2018-10-14 DIAGNOSIS — I2510 Atherosclerotic heart disease of native coronary artery without angina pectoris: Secondary | ICD-10-CM | POA: Diagnosis not present

## 2018-10-14 DIAGNOSIS — K219 Gastro-esophageal reflux disease without esophagitis: Secondary | ICD-10-CM | POA: Diagnosis not present

## 2018-10-14 DIAGNOSIS — J449 Chronic obstructive pulmonary disease, unspecified: Secondary | ICD-10-CM | POA: Diagnosis not present

## 2018-10-14 DIAGNOSIS — C049 Malignant neoplasm of floor of mouth, unspecified: Secondary | ICD-10-CM | POA: Diagnosis not present

## 2018-10-17 DIAGNOSIS — K219 Gastro-esophageal reflux disease without esophagitis: Secondary | ICD-10-CM | POA: Diagnosis not present

## 2018-10-17 DIAGNOSIS — I2510 Atherosclerotic heart disease of native coronary artery without angina pectoris: Secondary | ICD-10-CM | POA: Diagnosis not present

## 2018-10-17 DIAGNOSIS — I509 Heart failure, unspecified: Secondary | ICD-10-CM | POA: Diagnosis not present

## 2018-10-17 DIAGNOSIS — C049 Malignant neoplasm of floor of mouth, unspecified: Secondary | ICD-10-CM | POA: Diagnosis not present

## 2018-10-17 DIAGNOSIS — I10 Essential (primary) hypertension: Secondary | ICD-10-CM | POA: Diagnosis not present

## 2018-10-17 DIAGNOSIS — J449 Chronic obstructive pulmonary disease, unspecified: Secondary | ICD-10-CM | POA: Diagnosis not present

## 2018-10-24 DIAGNOSIS — J449 Chronic obstructive pulmonary disease, unspecified: Secondary | ICD-10-CM | POA: Diagnosis not present

## 2018-10-24 DIAGNOSIS — C049 Malignant neoplasm of floor of mouth, unspecified: Secondary | ICD-10-CM | POA: Diagnosis not present

## 2018-10-24 DIAGNOSIS — I10 Essential (primary) hypertension: Secondary | ICD-10-CM | POA: Diagnosis not present

## 2018-10-24 DIAGNOSIS — I509 Heart failure, unspecified: Secondary | ICD-10-CM | POA: Diagnosis not present

## 2018-10-24 DIAGNOSIS — K219 Gastro-esophageal reflux disease without esophagitis: Secondary | ICD-10-CM | POA: Diagnosis not present

## 2018-10-24 DIAGNOSIS — I2510 Atherosclerotic heart disease of native coronary artery without angina pectoris: Secondary | ICD-10-CM | POA: Diagnosis not present

## 2018-10-31 DIAGNOSIS — I509 Heart failure, unspecified: Secondary | ICD-10-CM | POA: Diagnosis not present

## 2018-10-31 DIAGNOSIS — I2510 Atherosclerotic heart disease of native coronary artery without angina pectoris: Secondary | ICD-10-CM | POA: Diagnosis not present

## 2018-10-31 DIAGNOSIS — K219 Gastro-esophageal reflux disease without esophagitis: Secondary | ICD-10-CM | POA: Diagnosis not present

## 2018-10-31 DIAGNOSIS — I10 Essential (primary) hypertension: Secondary | ICD-10-CM | POA: Diagnosis not present

## 2018-10-31 DIAGNOSIS — C049 Malignant neoplasm of floor of mouth, unspecified: Secondary | ICD-10-CM | POA: Diagnosis not present

## 2018-10-31 DIAGNOSIS — J449 Chronic obstructive pulmonary disease, unspecified: Secondary | ICD-10-CM | POA: Diagnosis not present

## 2018-11-05 DIAGNOSIS — C049 Malignant neoplasm of floor of mouth, unspecified: Secondary | ICD-10-CM | POA: Diagnosis not present

## 2018-11-05 DIAGNOSIS — I2510 Atherosclerotic heart disease of native coronary artery without angina pectoris: Secondary | ICD-10-CM | POA: Diagnosis not present

## 2018-11-05 DIAGNOSIS — K219 Gastro-esophageal reflux disease without esophagitis: Secondary | ICD-10-CM | POA: Diagnosis not present

## 2018-11-05 DIAGNOSIS — I10 Essential (primary) hypertension: Secondary | ICD-10-CM | POA: Diagnosis not present

## 2018-11-05 DIAGNOSIS — J449 Chronic obstructive pulmonary disease, unspecified: Secondary | ICD-10-CM | POA: Diagnosis not present

## 2018-11-05 DIAGNOSIS — I509 Heart failure, unspecified: Secondary | ICD-10-CM | POA: Diagnosis not present

## 2018-11-05 DIAGNOSIS — E46 Unspecified protein-calorie malnutrition: Secondary | ICD-10-CM | POA: Diagnosis not present

## 2018-11-08 DIAGNOSIS — I2510 Atherosclerotic heart disease of native coronary artery without angina pectoris: Secondary | ICD-10-CM | POA: Diagnosis not present

## 2018-11-08 DIAGNOSIS — K219 Gastro-esophageal reflux disease without esophagitis: Secondary | ICD-10-CM | POA: Diagnosis not present

## 2018-11-08 DIAGNOSIS — I509 Heart failure, unspecified: Secondary | ICD-10-CM | POA: Diagnosis not present

## 2018-11-08 DIAGNOSIS — J449 Chronic obstructive pulmonary disease, unspecified: Secondary | ICD-10-CM | POA: Diagnosis not present

## 2018-11-08 DIAGNOSIS — C049 Malignant neoplasm of floor of mouth, unspecified: Secondary | ICD-10-CM | POA: Diagnosis not present

## 2018-11-08 DIAGNOSIS — I10 Essential (primary) hypertension: Secondary | ICD-10-CM | POA: Diagnosis not present

## 2018-11-14 DIAGNOSIS — I10 Essential (primary) hypertension: Secondary | ICD-10-CM | POA: Diagnosis not present

## 2018-11-14 DIAGNOSIS — I2510 Atherosclerotic heart disease of native coronary artery without angina pectoris: Secondary | ICD-10-CM | POA: Diagnosis not present

## 2018-11-14 DIAGNOSIS — C049 Malignant neoplasm of floor of mouth, unspecified: Secondary | ICD-10-CM | POA: Diagnosis not present

## 2018-11-14 DIAGNOSIS — K219 Gastro-esophageal reflux disease without esophagitis: Secondary | ICD-10-CM | POA: Diagnosis not present

## 2018-11-14 DIAGNOSIS — J449 Chronic obstructive pulmonary disease, unspecified: Secondary | ICD-10-CM | POA: Diagnosis not present

## 2018-11-14 DIAGNOSIS — I509 Heart failure, unspecified: Secondary | ICD-10-CM | POA: Diagnosis not present

## 2018-11-15 DIAGNOSIS — K219 Gastro-esophageal reflux disease without esophagitis: Secondary | ICD-10-CM | POA: Diagnosis not present

## 2018-11-15 DIAGNOSIS — I509 Heart failure, unspecified: Secondary | ICD-10-CM | POA: Diagnosis not present

## 2018-11-15 DIAGNOSIS — I10 Essential (primary) hypertension: Secondary | ICD-10-CM | POA: Diagnosis not present

## 2018-11-15 DIAGNOSIS — I2510 Atherosclerotic heart disease of native coronary artery without angina pectoris: Secondary | ICD-10-CM | POA: Diagnosis not present

## 2018-11-15 DIAGNOSIS — J449 Chronic obstructive pulmonary disease, unspecified: Secondary | ICD-10-CM | POA: Diagnosis not present

## 2018-11-15 DIAGNOSIS — C049 Malignant neoplasm of floor of mouth, unspecified: Secondary | ICD-10-CM | POA: Diagnosis not present

## 2018-11-22 DIAGNOSIS — I509 Heart failure, unspecified: Secondary | ICD-10-CM | POA: Diagnosis not present

## 2018-11-22 DIAGNOSIS — I2510 Atherosclerotic heart disease of native coronary artery without angina pectoris: Secondary | ICD-10-CM | POA: Diagnosis not present

## 2018-11-22 DIAGNOSIS — J449 Chronic obstructive pulmonary disease, unspecified: Secondary | ICD-10-CM | POA: Diagnosis not present

## 2018-11-22 DIAGNOSIS — K219 Gastro-esophageal reflux disease without esophagitis: Secondary | ICD-10-CM | POA: Diagnosis not present

## 2018-11-22 DIAGNOSIS — C049 Malignant neoplasm of floor of mouth, unspecified: Secondary | ICD-10-CM | POA: Diagnosis not present

## 2018-11-22 DIAGNOSIS — I10 Essential (primary) hypertension: Secondary | ICD-10-CM | POA: Diagnosis not present

## 2018-11-28 DIAGNOSIS — C049 Malignant neoplasm of floor of mouth, unspecified: Secondary | ICD-10-CM | POA: Diagnosis not present

## 2018-11-28 DIAGNOSIS — I10 Essential (primary) hypertension: Secondary | ICD-10-CM | POA: Diagnosis not present

## 2018-11-28 DIAGNOSIS — I2510 Atherosclerotic heart disease of native coronary artery without angina pectoris: Secondary | ICD-10-CM | POA: Diagnosis not present

## 2018-11-28 DIAGNOSIS — J449 Chronic obstructive pulmonary disease, unspecified: Secondary | ICD-10-CM | POA: Diagnosis not present

## 2018-11-28 DIAGNOSIS — I509 Heart failure, unspecified: Secondary | ICD-10-CM | POA: Diagnosis not present

## 2018-11-28 DIAGNOSIS — K219 Gastro-esophageal reflux disease without esophagitis: Secondary | ICD-10-CM | POA: Diagnosis not present

## 2018-12-04 DIAGNOSIS — K219 Gastro-esophageal reflux disease without esophagitis: Secondary | ICD-10-CM | POA: Diagnosis not present

## 2018-12-04 DIAGNOSIS — I2510 Atherosclerotic heart disease of native coronary artery without angina pectoris: Secondary | ICD-10-CM | POA: Diagnosis not present

## 2018-12-04 DIAGNOSIS — I10 Essential (primary) hypertension: Secondary | ICD-10-CM | POA: Diagnosis not present

## 2018-12-04 DIAGNOSIS — I509 Heart failure, unspecified: Secondary | ICD-10-CM | POA: Diagnosis not present

## 2018-12-04 DIAGNOSIS — E46 Unspecified protein-calorie malnutrition: Secondary | ICD-10-CM | POA: Diagnosis not present

## 2018-12-04 DIAGNOSIS — C049 Malignant neoplasm of floor of mouth, unspecified: Secondary | ICD-10-CM | POA: Diagnosis not present

## 2018-12-04 DIAGNOSIS — J449 Chronic obstructive pulmonary disease, unspecified: Secondary | ICD-10-CM | POA: Diagnosis not present

## 2018-12-05 DIAGNOSIS — J449 Chronic obstructive pulmonary disease, unspecified: Secondary | ICD-10-CM | POA: Diagnosis not present

## 2018-12-05 DIAGNOSIS — K219 Gastro-esophageal reflux disease without esophagitis: Secondary | ICD-10-CM | POA: Diagnosis not present

## 2018-12-05 DIAGNOSIS — I509 Heart failure, unspecified: Secondary | ICD-10-CM | POA: Diagnosis not present

## 2018-12-05 DIAGNOSIS — I2510 Atherosclerotic heart disease of native coronary artery without angina pectoris: Secondary | ICD-10-CM | POA: Diagnosis not present

## 2018-12-05 DIAGNOSIS — I10 Essential (primary) hypertension: Secondary | ICD-10-CM | POA: Diagnosis not present

## 2018-12-05 DIAGNOSIS — C049 Malignant neoplasm of floor of mouth, unspecified: Secondary | ICD-10-CM | POA: Diagnosis not present

## 2018-12-07 DIAGNOSIS — I509 Heart failure, unspecified: Secondary | ICD-10-CM | POA: Diagnosis not present

## 2018-12-07 DIAGNOSIS — J449 Chronic obstructive pulmonary disease, unspecified: Secondary | ICD-10-CM | POA: Diagnosis not present

## 2018-12-07 DIAGNOSIS — I10 Essential (primary) hypertension: Secondary | ICD-10-CM | POA: Diagnosis not present

## 2018-12-07 DIAGNOSIS — I2510 Atherosclerotic heart disease of native coronary artery without angina pectoris: Secondary | ICD-10-CM | POA: Diagnosis not present

## 2018-12-07 DIAGNOSIS — C049 Malignant neoplasm of floor of mouth, unspecified: Secondary | ICD-10-CM | POA: Diagnosis not present

## 2018-12-07 DIAGNOSIS — K219 Gastro-esophageal reflux disease without esophagitis: Secondary | ICD-10-CM | POA: Diagnosis not present

## 2018-12-13 DIAGNOSIS — I509 Heart failure, unspecified: Secondary | ICD-10-CM | POA: Diagnosis not present

## 2018-12-13 DIAGNOSIS — J449 Chronic obstructive pulmonary disease, unspecified: Secondary | ICD-10-CM | POA: Diagnosis not present

## 2018-12-13 DIAGNOSIS — I10 Essential (primary) hypertension: Secondary | ICD-10-CM | POA: Diagnosis not present

## 2018-12-13 DIAGNOSIS — C049 Malignant neoplasm of floor of mouth, unspecified: Secondary | ICD-10-CM | POA: Diagnosis not present

## 2018-12-13 DIAGNOSIS — K219 Gastro-esophageal reflux disease without esophagitis: Secondary | ICD-10-CM | POA: Diagnosis not present

## 2018-12-13 DIAGNOSIS — I2510 Atherosclerotic heart disease of native coronary artery without angina pectoris: Secondary | ICD-10-CM | POA: Diagnosis not present

## 2018-12-15 DIAGNOSIS — I509 Heart failure, unspecified: Secondary | ICD-10-CM | POA: Diagnosis not present

## 2018-12-15 DIAGNOSIS — J449 Chronic obstructive pulmonary disease, unspecified: Secondary | ICD-10-CM | POA: Diagnosis not present

## 2018-12-15 DIAGNOSIS — I10 Essential (primary) hypertension: Secondary | ICD-10-CM | POA: Diagnosis not present

## 2018-12-15 DIAGNOSIS — I2510 Atherosclerotic heart disease of native coronary artery without angina pectoris: Secondary | ICD-10-CM | POA: Diagnosis not present

## 2018-12-15 DIAGNOSIS — C049 Malignant neoplasm of floor of mouth, unspecified: Secondary | ICD-10-CM | POA: Diagnosis not present

## 2018-12-15 DIAGNOSIS — K219 Gastro-esophageal reflux disease without esophagitis: Secondary | ICD-10-CM | POA: Diagnosis not present

## 2018-12-20 DIAGNOSIS — K219 Gastro-esophageal reflux disease without esophagitis: Secondary | ICD-10-CM | POA: Diagnosis not present

## 2018-12-20 DIAGNOSIS — C049 Malignant neoplasm of floor of mouth, unspecified: Secondary | ICD-10-CM | POA: Diagnosis not present

## 2018-12-20 DIAGNOSIS — I509 Heart failure, unspecified: Secondary | ICD-10-CM | POA: Diagnosis not present

## 2018-12-20 DIAGNOSIS — I2510 Atherosclerotic heart disease of native coronary artery without angina pectoris: Secondary | ICD-10-CM | POA: Diagnosis not present

## 2018-12-20 DIAGNOSIS — I10 Essential (primary) hypertension: Secondary | ICD-10-CM | POA: Diagnosis not present

## 2018-12-20 DIAGNOSIS — J449 Chronic obstructive pulmonary disease, unspecified: Secondary | ICD-10-CM | POA: Diagnosis not present

## 2018-12-27 DIAGNOSIS — C049 Malignant neoplasm of floor of mouth, unspecified: Secondary | ICD-10-CM | POA: Diagnosis not present

## 2018-12-27 DIAGNOSIS — K219 Gastro-esophageal reflux disease without esophagitis: Secondary | ICD-10-CM | POA: Diagnosis not present

## 2018-12-27 DIAGNOSIS — J449 Chronic obstructive pulmonary disease, unspecified: Secondary | ICD-10-CM | POA: Diagnosis not present

## 2018-12-27 DIAGNOSIS — I509 Heart failure, unspecified: Secondary | ICD-10-CM | POA: Diagnosis not present

## 2018-12-27 DIAGNOSIS — I2510 Atherosclerotic heart disease of native coronary artery without angina pectoris: Secondary | ICD-10-CM | POA: Diagnosis not present

## 2018-12-27 DIAGNOSIS — I10 Essential (primary) hypertension: Secondary | ICD-10-CM | POA: Diagnosis not present

## 2019-01-03 DIAGNOSIS — I2510 Atherosclerotic heart disease of native coronary artery without angina pectoris: Secondary | ICD-10-CM | POA: Diagnosis not present

## 2019-01-03 DIAGNOSIS — C049 Malignant neoplasm of floor of mouth, unspecified: Secondary | ICD-10-CM | POA: Diagnosis not present

## 2019-01-03 DIAGNOSIS — K219 Gastro-esophageal reflux disease without esophagitis: Secondary | ICD-10-CM | POA: Diagnosis not present

## 2019-01-03 DIAGNOSIS — I509 Heart failure, unspecified: Secondary | ICD-10-CM | POA: Diagnosis not present

## 2019-01-03 DIAGNOSIS — J449 Chronic obstructive pulmonary disease, unspecified: Secondary | ICD-10-CM | POA: Diagnosis not present

## 2019-01-03 DIAGNOSIS — I10 Essential (primary) hypertension: Secondary | ICD-10-CM | POA: Diagnosis not present

## 2019-01-04 DIAGNOSIS — I2510 Atherosclerotic heart disease of native coronary artery without angina pectoris: Secondary | ICD-10-CM | POA: Diagnosis not present

## 2019-01-04 DIAGNOSIS — I509 Heart failure, unspecified: Secondary | ICD-10-CM | POA: Diagnosis not present

## 2019-01-04 DIAGNOSIS — I10 Essential (primary) hypertension: Secondary | ICD-10-CM | POA: Diagnosis not present

## 2019-01-04 DIAGNOSIS — L0201 Cutaneous abscess of face: Secondary | ICD-10-CM | POA: Diagnosis not present

## 2019-01-04 DIAGNOSIS — J449 Chronic obstructive pulmonary disease, unspecified: Secondary | ICD-10-CM | POA: Diagnosis not present

## 2019-01-04 DIAGNOSIS — Z9981 Dependence on supplemental oxygen: Secondary | ICD-10-CM | POA: Diagnosis not present

## 2019-01-04 DIAGNOSIS — C049 Malignant neoplasm of floor of mouth, unspecified: Secondary | ICD-10-CM | POA: Diagnosis not present

## 2019-01-04 DIAGNOSIS — E46 Unspecified protein-calorie malnutrition: Secondary | ICD-10-CM | POA: Diagnosis not present

## 2019-01-04 DIAGNOSIS — K219 Gastro-esophageal reflux disease without esophagitis: Secondary | ICD-10-CM | POA: Diagnosis not present

## 2019-01-16 DIAGNOSIS — I10 Essential (primary) hypertension: Secondary | ICD-10-CM | POA: Diagnosis not present

## 2019-01-16 DIAGNOSIS — K219 Gastro-esophageal reflux disease without esophagitis: Secondary | ICD-10-CM | POA: Diagnosis not present

## 2019-01-16 DIAGNOSIS — I509 Heart failure, unspecified: Secondary | ICD-10-CM | POA: Diagnosis not present

## 2019-01-16 DIAGNOSIS — I2510 Atherosclerotic heart disease of native coronary artery without angina pectoris: Secondary | ICD-10-CM | POA: Diagnosis not present

## 2019-01-16 DIAGNOSIS — C049 Malignant neoplasm of floor of mouth, unspecified: Secondary | ICD-10-CM | POA: Diagnosis not present

## 2019-01-16 DIAGNOSIS — J449 Chronic obstructive pulmonary disease, unspecified: Secondary | ICD-10-CM | POA: Diagnosis not present

## 2019-02-03 DIAGNOSIS — I509 Heart failure, unspecified: Secondary | ICD-10-CM | POA: Diagnosis not present

## 2019-02-03 DIAGNOSIS — I10 Essential (primary) hypertension: Secondary | ICD-10-CM | POA: Diagnosis not present

## 2019-02-03 DIAGNOSIS — C049 Malignant neoplasm of floor of mouth, unspecified: Secondary | ICD-10-CM | POA: Diagnosis not present

## 2019-02-03 DIAGNOSIS — K219 Gastro-esophageal reflux disease without esophagitis: Secondary | ICD-10-CM | POA: Diagnosis not present

## 2019-02-03 DIAGNOSIS — Z9981 Dependence on supplemental oxygen: Secondary | ICD-10-CM | POA: Diagnosis not present

## 2019-02-03 DIAGNOSIS — L0201 Cutaneous abscess of face: Secondary | ICD-10-CM | POA: Diagnosis not present

## 2019-02-03 DIAGNOSIS — E46 Unspecified protein-calorie malnutrition: Secondary | ICD-10-CM | POA: Diagnosis not present

## 2019-02-03 DIAGNOSIS — I2510 Atherosclerotic heart disease of native coronary artery without angina pectoris: Secondary | ICD-10-CM | POA: Diagnosis not present

## 2019-02-03 DIAGNOSIS — J449 Chronic obstructive pulmonary disease, unspecified: Secondary | ICD-10-CM | POA: Diagnosis not present

## 2019-02-13 DIAGNOSIS — C049 Malignant neoplasm of floor of mouth, unspecified: Secondary | ICD-10-CM | POA: Diagnosis not present

## 2019-02-13 DIAGNOSIS — K219 Gastro-esophageal reflux disease without esophagitis: Secondary | ICD-10-CM | POA: Diagnosis not present

## 2019-02-13 DIAGNOSIS — J449 Chronic obstructive pulmonary disease, unspecified: Secondary | ICD-10-CM | POA: Diagnosis not present

## 2019-02-13 DIAGNOSIS — I10 Essential (primary) hypertension: Secondary | ICD-10-CM | POA: Diagnosis not present

## 2019-02-13 DIAGNOSIS — I2510 Atherosclerotic heart disease of native coronary artery without angina pectoris: Secondary | ICD-10-CM | POA: Diagnosis not present

## 2019-02-13 DIAGNOSIS — I509 Heart failure, unspecified: Secondary | ICD-10-CM | POA: Diagnosis not present

## 2019-02-28 DIAGNOSIS — K219 Gastro-esophageal reflux disease without esophagitis: Secondary | ICD-10-CM | POA: Diagnosis not present

## 2019-02-28 DIAGNOSIS — I509 Heart failure, unspecified: Secondary | ICD-10-CM | POA: Diagnosis not present

## 2019-02-28 DIAGNOSIS — I2510 Atherosclerotic heart disease of native coronary artery without angina pectoris: Secondary | ICD-10-CM | POA: Diagnosis not present

## 2019-02-28 DIAGNOSIS — I10 Essential (primary) hypertension: Secondary | ICD-10-CM | POA: Diagnosis not present

## 2019-02-28 DIAGNOSIS — J449 Chronic obstructive pulmonary disease, unspecified: Secondary | ICD-10-CM | POA: Diagnosis not present

## 2019-02-28 DIAGNOSIS — C049 Malignant neoplasm of floor of mouth, unspecified: Secondary | ICD-10-CM | POA: Diagnosis not present

## 2019-03-06 DIAGNOSIS — K219 Gastro-esophageal reflux disease without esophagitis: Secondary | ICD-10-CM | POA: Diagnosis not present

## 2019-03-06 DIAGNOSIS — I2510 Atherosclerotic heart disease of native coronary artery without angina pectoris: Secondary | ICD-10-CM | POA: Diagnosis not present

## 2019-03-06 DIAGNOSIS — E46 Unspecified protein-calorie malnutrition: Secondary | ICD-10-CM | POA: Diagnosis not present

## 2019-03-06 DIAGNOSIS — I10 Essential (primary) hypertension: Secondary | ICD-10-CM | POA: Diagnosis not present

## 2019-03-06 DIAGNOSIS — Z9981 Dependence on supplemental oxygen: Secondary | ICD-10-CM | POA: Diagnosis not present

## 2019-03-06 DIAGNOSIS — J449 Chronic obstructive pulmonary disease, unspecified: Secondary | ICD-10-CM | POA: Diagnosis not present

## 2019-03-06 DIAGNOSIS — L0201 Cutaneous abscess of face: Secondary | ICD-10-CM | POA: Diagnosis not present

## 2019-03-06 DIAGNOSIS — I509 Heart failure, unspecified: Secondary | ICD-10-CM | POA: Diagnosis not present

## 2019-03-06 DIAGNOSIS — C049 Malignant neoplasm of floor of mouth, unspecified: Secondary | ICD-10-CM | POA: Diagnosis not present

## 2019-03-08 DIAGNOSIS — J449 Chronic obstructive pulmonary disease, unspecified: Secondary | ICD-10-CM | POA: Diagnosis not present

## 2019-03-08 DIAGNOSIS — I10 Essential (primary) hypertension: Secondary | ICD-10-CM | POA: Diagnosis not present

## 2019-03-08 DIAGNOSIS — K219 Gastro-esophageal reflux disease without esophagitis: Secondary | ICD-10-CM | POA: Diagnosis not present

## 2019-03-08 DIAGNOSIS — I509 Heart failure, unspecified: Secondary | ICD-10-CM | POA: Diagnosis not present

## 2019-03-08 DIAGNOSIS — C049 Malignant neoplasm of floor of mouth, unspecified: Secondary | ICD-10-CM | POA: Diagnosis not present

## 2019-03-08 DIAGNOSIS — I2510 Atherosclerotic heart disease of native coronary artery without angina pectoris: Secondary | ICD-10-CM | POA: Diagnosis not present

## 2019-03-14 DIAGNOSIS — I509 Heart failure, unspecified: Secondary | ICD-10-CM | POA: Diagnosis not present

## 2019-03-14 DIAGNOSIS — I2510 Atherosclerotic heart disease of native coronary artery without angina pectoris: Secondary | ICD-10-CM | POA: Diagnosis not present

## 2019-03-14 DIAGNOSIS — I10 Essential (primary) hypertension: Secondary | ICD-10-CM | POA: Diagnosis not present

## 2019-03-14 DIAGNOSIS — K219 Gastro-esophageal reflux disease without esophagitis: Secondary | ICD-10-CM | POA: Diagnosis not present

## 2019-03-14 DIAGNOSIS — J449 Chronic obstructive pulmonary disease, unspecified: Secondary | ICD-10-CM | POA: Diagnosis not present

## 2019-03-14 DIAGNOSIS — C049 Malignant neoplasm of floor of mouth, unspecified: Secondary | ICD-10-CM | POA: Diagnosis not present

## 2019-03-28 DIAGNOSIS — I10 Essential (primary) hypertension: Secondary | ICD-10-CM | POA: Diagnosis not present

## 2019-03-28 DIAGNOSIS — I2510 Atherosclerotic heart disease of native coronary artery without angina pectoris: Secondary | ICD-10-CM | POA: Diagnosis not present

## 2019-03-28 DIAGNOSIS — K219 Gastro-esophageal reflux disease without esophagitis: Secondary | ICD-10-CM | POA: Diagnosis not present

## 2019-03-28 DIAGNOSIS — I509 Heart failure, unspecified: Secondary | ICD-10-CM | POA: Diagnosis not present

## 2019-03-28 DIAGNOSIS — J449 Chronic obstructive pulmonary disease, unspecified: Secondary | ICD-10-CM | POA: Diagnosis not present

## 2019-03-28 DIAGNOSIS — C049 Malignant neoplasm of floor of mouth, unspecified: Secondary | ICD-10-CM | POA: Diagnosis not present

## 2019-04-05 DIAGNOSIS — E46 Unspecified protein-calorie malnutrition: Secondary | ICD-10-CM | POA: Diagnosis not present

## 2019-04-05 DIAGNOSIS — C049 Malignant neoplasm of floor of mouth, unspecified: Secondary | ICD-10-CM | POA: Diagnosis not present

## 2019-04-05 DIAGNOSIS — I2510 Atherosclerotic heart disease of native coronary artery without angina pectoris: Secondary | ICD-10-CM | POA: Diagnosis not present

## 2019-04-05 DIAGNOSIS — L0201 Cutaneous abscess of face: Secondary | ICD-10-CM | POA: Diagnosis not present

## 2019-04-05 DIAGNOSIS — R296 Repeated falls: Secondary | ICD-10-CM | POA: Diagnosis not present

## 2019-04-05 DIAGNOSIS — I509 Heart failure, unspecified: Secondary | ICD-10-CM | POA: Diagnosis not present

## 2019-04-05 DIAGNOSIS — K219 Gastro-esophageal reflux disease without esophagitis: Secondary | ICD-10-CM | POA: Diagnosis not present

## 2019-04-05 DIAGNOSIS — I110 Hypertensive heart disease with heart failure: Secondary | ICD-10-CM | POA: Diagnosis not present

## 2019-04-05 DIAGNOSIS — J449 Chronic obstructive pulmonary disease, unspecified: Secondary | ICD-10-CM | POA: Diagnosis not present

## 2019-04-05 DIAGNOSIS — Z9981 Dependence on supplemental oxygen: Secondary | ICD-10-CM | POA: Diagnosis not present

## 2019-04-06 DIAGNOSIS — M2042 Other hammer toe(s) (acquired), left foot: Secondary | ICD-10-CM | POA: Diagnosis not present

## 2019-04-06 DIAGNOSIS — L97522 Non-pressure chronic ulcer of other part of left foot with fat layer exposed: Secondary | ICD-10-CM | POA: Diagnosis not present

## 2019-04-10 DIAGNOSIS — I509 Heart failure, unspecified: Secondary | ICD-10-CM | POA: Diagnosis not present

## 2019-04-10 DIAGNOSIS — C049 Malignant neoplasm of floor of mouth, unspecified: Secondary | ICD-10-CM | POA: Diagnosis not present

## 2019-04-10 DIAGNOSIS — J449 Chronic obstructive pulmonary disease, unspecified: Secondary | ICD-10-CM | POA: Diagnosis not present

## 2019-04-10 DIAGNOSIS — I2510 Atherosclerotic heart disease of native coronary artery without angina pectoris: Secondary | ICD-10-CM | POA: Diagnosis not present

## 2019-04-10 DIAGNOSIS — I110 Hypertensive heart disease with heart failure: Secondary | ICD-10-CM | POA: Diagnosis not present

## 2019-04-10 DIAGNOSIS — K219 Gastro-esophageal reflux disease without esophagitis: Secondary | ICD-10-CM | POA: Diagnosis not present

## 2019-04-18 DIAGNOSIS — I110 Hypertensive heart disease with heart failure: Secondary | ICD-10-CM | POA: Diagnosis not present

## 2019-04-18 DIAGNOSIS — J449 Chronic obstructive pulmonary disease, unspecified: Secondary | ICD-10-CM | POA: Diagnosis not present

## 2019-04-18 DIAGNOSIS — I2510 Atherosclerotic heart disease of native coronary artery without angina pectoris: Secondary | ICD-10-CM | POA: Diagnosis not present

## 2019-04-18 DIAGNOSIS — C049 Malignant neoplasm of floor of mouth, unspecified: Secondary | ICD-10-CM | POA: Diagnosis not present

## 2019-04-18 DIAGNOSIS — I509 Heart failure, unspecified: Secondary | ICD-10-CM | POA: Diagnosis not present

## 2019-04-18 DIAGNOSIS — K219 Gastro-esophageal reflux disease without esophagitis: Secondary | ICD-10-CM | POA: Diagnosis not present

## 2019-04-20 DIAGNOSIS — L97521 Non-pressure chronic ulcer of other part of left foot limited to breakdown of skin: Secondary | ICD-10-CM | POA: Diagnosis not present

## 2019-04-25 DIAGNOSIS — J449 Chronic obstructive pulmonary disease, unspecified: Secondary | ICD-10-CM | POA: Diagnosis not present

## 2019-04-25 DIAGNOSIS — I2510 Atherosclerotic heart disease of native coronary artery without angina pectoris: Secondary | ICD-10-CM | POA: Diagnosis not present

## 2019-04-25 DIAGNOSIS — K219 Gastro-esophageal reflux disease without esophagitis: Secondary | ICD-10-CM | POA: Diagnosis not present

## 2019-04-25 DIAGNOSIS — C049 Malignant neoplasm of floor of mouth, unspecified: Secondary | ICD-10-CM | POA: Diagnosis not present

## 2019-04-25 DIAGNOSIS — I110 Hypertensive heart disease with heart failure: Secondary | ICD-10-CM | POA: Diagnosis not present

## 2019-04-25 DIAGNOSIS — I509 Heart failure, unspecified: Secondary | ICD-10-CM | POA: Diagnosis not present

## 2019-04-26 DIAGNOSIS — I2510 Atherosclerotic heart disease of native coronary artery without angina pectoris: Secondary | ICD-10-CM | POA: Diagnosis not present

## 2019-04-26 DIAGNOSIS — I509 Heart failure, unspecified: Secondary | ICD-10-CM | POA: Diagnosis not present

## 2019-04-26 DIAGNOSIS — J449 Chronic obstructive pulmonary disease, unspecified: Secondary | ICD-10-CM | POA: Diagnosis not present

## 2019-04-26 DIAGNOSIS — I110 Hypertensive heart disease with heart failure: Secondary | ICD-10-CM | POA: Diagnosis not present

## 2019-04-26 DIAGNOSIS — C049 Malignant neoplasm of floor of mouth, unspecified: Secondary | ICD-10-CM | POA: Diagnosis not present

## 2019-04-26 DIAGNOSIS — K219 Gastro-esophageal reflux disease without esophagitis: Secondary | ICD-10-CM | POA: Diagnosis not present

## 2019-05-02 DIAGNOSIS — J449 Chronic obstructive pulmonary disease, unspecified: Secondary | ICD-10-CM | POA: Diagnosis not present

## 2019-05-02 DIAGNOSIS — C049 Malignant neoplasm of floor of mouth, unspecified: Secondary | ICD-10-CM | POA: Diagnosis not present

## 2019-05-02 DIAGNOSIS — K219 Gastro-esophageal reflux disease without esophagitis: Secondary | ICD-10-CM | POA: Diagnosis not present

## 2019-05-02 DIAGNOSIS — I509 Heart failure, unspecified: Secondary | ICD-10-CM | POA: Diagnosis not present

## 2019-05-02 DIAGNOSIS — I2510 Atherosclerotic heart disease of native coronary artery without angina pectoris: Secondary | ICD-10-CM | POA: Diagnosis not present

## 2019-05-02 DIAGNOSIS — I110 Hypertensive heart disease with heart failure: Secondary | ICD-10-CM | POA: Diagnosis not present

## 2019-05-06 DIAGNOSIS — L0201 Cutaneous abscess of face: Secondary | ICD-10-CM | POA: Diagnosis not present

## 2019-05-06 DIAGNOSIS — R296 Repeated falls: Secondary | ICD-10-CM | POA: Diagnosis not present

## 2019-05-06 DIAGNOSIS — I509 Heart failure, unspecified: Secondary | ICD-10-CM | POA: Diagnosis not present

## 2019-05-06 DIAGNOSIS — Z9981 Dependence on supplemental oxygen: Secondary | ICD-10-CM | POA: Diagnosis not present

## 2019-05-06 DIAGNOSIS — E46 Unspecified protein-calorie malnutrition: Secondary | ICD-10-CM | POA: Diagnosis not present

## 2019-05-06 DIAGNOSIS — J449 Chronic obstructive pulmonary disease, unspecified: Secondary | ICD-10-CM | POA: Diagnosis not present

## 2019-05-06 DIAGNOSIS — C049 Malignant neoplasm of floor of mouth, unspecified: Secondary | ICD-10-CM | POA: Diagnosis not present

## 2019-05-06 DIAGNOSIS — K219 Gastro-esophageal reflux disease without esophagitis: Secondary | ICD-10-CM | POA: Diagnosis not present

## 2019-05-06 DIAGNOSIS — I2510 Atherosclerotic heart disease of native coronary artery without angina pectoris: Secondary | ICD-10-CM | POA: Diagnosis not present

## 2019-05-06 DIAGNOSIS — I110 Hypertensive heart disease with heart failure: Secondary | ICD-10-CM | POA: Diagnosis not present

## 2019-05-09 DIAGNOSIS — C049 Malignant neoplasm of floor of mouth, unspecified: Secondary | ICD-10-CM | POA: Diagnosis not present

## 2019-05-09 DIAGNOSIS — J449 Chronic obstructive pulmonary disease, unspecified: Secondary | ICD-10-CM | POA: Diagnosis not present

## 2019-05-09 DIAGNOSIS — I509 Heart failure, unspecified: Secondary | ICD-10-CM | POA: Diagnosis not present

## 2019-05-09 DIAGNOSIS — I110 Hypertensive heart disease with heart failure: Secondary | ICD-10-CM | POA: Diagnosis not present

## 2019-05-09 DIAGNOSIS — K219 Gastro-esophageal reflux disease without esophagitis: Secondary | ICD-10-CM | POA: Diagnosis not present

## 2019-05-09 DIAGNOSIS — I2510 Atherosclerotic heart disease of native coronary artery without angina pectoris: Secondary | ICD-10-CM | POA: Diagnosis not present

## 2019-05-10 DIAGNOSIS — I509 Heart failure, unspecified: Secondary | ICD-10-CM | POA: Diagnosis not present

## 2019-05-10 DIAGNOSIS — I2510 Atherosclerotic heart disease of native coronary artery without angina pectoris: Secondary | ICD-10-CM | POA: Diagnosis not present

## 2019-05-10 DIAGNOSIS — K219 Gastro-esophageal reflux disease without esophagitis: Secondary | ICD-10-CM | POA: Diagnosis not present

## 2019-05-10 DIAGNOSIS — C049 Malignant neoplasm of floor of mouth, unspecified: Secondary | ICD-10-CM | POA: Diagnosis not present

## 2019-05-10 DIAGNOSIS — J449 Chronic obstructive pulmonary disease, unspecified: Secondary | ICD-10-CM | POA: Diagnosis not present

## 2019-05-10 DIAGNOSIS — I110 Hypertensive heart disease with heart failure: Secondary | ICD-10-CM | POA: Diagnosis not present

## 2019-05-12 DIAGNOSIS — I2510 Atherosclerotic heart disease of native coronary artery without angina pectoris: Secondary | ICD-10-CM | POA: Diagnosis not present

## 2019-05-12 DIAGNOSIS — I110 Hypertensive heart disease with heart failure: Secondary | ICD-10-CM | POA: Diagnosis not present

## 2019-05-12 DIAGNOSIS — K219 Gastro-esophageal reflux disease without esophagitis: Secondary | ICD-10-CM | POA: Diagnosis not present

## 2019-05-12 DIAGNOSIS — I509 Heart failure, unspecified: Secondary | ICD-10-CM | POA: Diagnosis not present

## 2019-05-12 DIAGNOSIS — C049 Malignant neoplasm of floor of mouth, unspecified: Secondary | ICD-10-CM | POA: Diagnosis not present

## 2019-05-12 DIAGNOSIS — J449 Chronic obstructive pulmonary disease, unspecified: Secondary | ICD-10-CM | POA: Diagnosis not present

## 2019-05-16 DIAGNOSIS — K219 Gastro-esophageal reflux disease without esophagitis: Secondary | ICD-10-CM | POA: Diagnosis not present

## 2019-05-16 DIAGNOSIS — I110 Hypertensive heart disease with heart failure: Secondary | ICD-10-CM | POA: Diagnosis not present

## 2019-05-16 DIAGNOSIS — J449 Chronic obstructive pulmonary disease, unspecified: Secondary | ICD-10-CM | POA: Diagnosis not present

## 2019-05-16 DIAGNOSIS — I509 Heart failure, unspecified: Secondary | ICD-10-CM | POA: Diagnosis not present

## 2019-05-16 DIAGNOSIS — I2510 Atherosclerotic heart disease of native coronary artery without angina pectoris: Secondary | ICD-10-CM | POA: Diagnosis not present

## 2019-05-16 DIAGNOSIS — C049 Malignant neoplasm of floor of mouth, unspecified: Secondary | ICD-10-CM | POA: Diagnosis not present

## 2019-05-23 DIAGNOSIS — C049 Malignant neoplasm of floor of mouth, unspecified: Secondary | ICD-10-CM | POA: Diagnosis not present

## 2019-05-23 DIAGNOSIS — I509 Heart failure, unspecified: Secondary | ICD-10-CM | POA: Diagnosis not present

## 2019-05-23 DIAGNOSIS — J449 Chronic obstructive pulmonary disease, unspecified: Secondary | ICD-10-CM | POA: Diagnosis not present

## 2019-05-23 DIAGNOSIS — I2510 Atherosclerotic heart disease of native coronary artery without angina pectoris: Secondary | ICD-10-CM | POA: Diagnosis not present

## 2019-05-23 DIAGNOSIS — I110 Hypertensive heart disease with heart failure: Secondary | ICD-10-CM | POA: Diagnosis not present

## 2019-05-23 DIAGNOSIS — K219 Gastro-esophageal reflux disease without esophagitis: Secondary | ICD-10-CM | POA: Diagnosis not present

## 2019-05-30 DIAGNOSIS — C049 Malignant neoplasm of floor of mouth, unspecified: Secondary | ICD-10-CM | POA: Diagnosis not present

## 2019-05-30 DIAGNOSIS — I110 Hypertensive heart disease with heart failure: Secondary | ICD-10-CM | POA: Diagnosis not present

## 2019-05-30 DIAGNOSIS — J449 Chronic obstructive pulmonary disease, unspecified: Secondary | ICD-10-CM | POA: Diagnosis not present

## 2019-05-30 DIAGNOSIS — I509 Heart failure, unspecified: Secondary | ICD-10-CM | POA: Diagnosis not present

## 2019-05-30 DIAGNOSIS — K219 Gastro-esophageal reflux disease without esophagitis: Secondary | ICD-10-CM | POA: Diagnosis not present

## 2019-05-30 DIAGNOSIS — I2510 Atherosclerotic heart disease of native coronary artery without angina pectoris: Secondary | ICD-10-CM | POA: Diagnosis not present

## 2019-06-06 DIAGNOSIS — I110 Hypertensive heart disease with heart failure: Secondary | ICD-10-CM | POA: Diagnosis not present

## 2019-06-06 DIAGNOSIS — C049 Malignant neoplasm of floor of mouth, unspecified: Secondary | ICD-10-CM | POA: Diagnosis not present

## 2019-06-06 DIAGNOSIS — J449 Chronic obstructive pulmonary disease, unspecified: Secondary | ICD-10-CM | POA: Diagnosis not present

## 2019-06-06 DIAGNOSIS — Z9981 Dependence on supplemental oxygen: Secondary | ICD-10-CM | POA: Diagnosis not present

## 2019-06-06 DIAGNOSIS — E46 Unspecified protein-calorie malnutrition: Secondary | ICD-10-CM | POA: Diagnosis not present

## 2019-06-06 DIAGNOSIS — I2510 Atherosclerotic heart disease of native coronary artery without angina pectoris: Secondary | ICD-10-CM | POA: Diagnosis not present

## 2019-06-06 DIAGNOSIS — I509 Heart failure, unspecified: Secondary | ICD-10-CM | POA: Diagnosis not present

## 2019-06-06 DIAGNOSIS — R296 Repeated falls: Secondary | ICD-10-CM | POA: Diagnosis not present

## 2019-06-06 DIAGNOSIS — R1310 Dysphagia, unspecified: Secondary | ICD-10-CM | POA: Diagnosis not present

## 2019-06-06 DIAGNOSIS — K219 Gastro-esophageal reflux disease without esophagitis: Secondary | ICD-10-CM | POA: Diagnosis not present

## 2019-06-06 DIAGNOSIS — L0201 Cutaneous abscess of face: Secondary | ICD-10-CM | POA: Diagnosis not present

## 2019-06-13 DIAGNOSIS — I509 Heart failure, unspecified: Secondary | ICD-10-CM | POA: Diagnosis not present

## 2019-06-13 DIAGNOSIS — C049 Malignant neoplasm of floor of mouth, unspecified: Secondary | ICD-10-CM | POA: Diagnosis not present

## 2019-06-13 DIAGNOSIS — I110 Hypertensive heart disease with heart failure: Secondary | ICD-10-CM | POA: Diagnosis not present

## 2019-06-13 DIAGNOSIS — I2510 Atherosclerotic heart disease of native coronary artery without angina pectoris: Secondary | ICD-10-CM | POA: Diagnosis not present

## 2019-06-13 DIAGNOSIS — K219 Gastro-esophageal reflux disease without esophagitis: Secondary | ICD-10-CM | POA: Diagnosis not present

## 2019-06-13 DIAGNOSIS — J449 Chronic obstructive pulmonary disease, unspecified: Secondary | ICD-10-CM | POA: Diagnosis not present

## 2019-06-15 DIAGNOSIS — I2510 Atherosclerotic heart disease of native coronary artery without angina pectoris: Secondary | ICD-10-CM | POA: Diagnosis not present

## 2019-06-15 DIAGNOSIS — I509 Heart failure, unspecified: Secondary | ICD-10-CM | POA: Diagnosis not present

## 2019-06-15 DIAGNOSIS — I110 Hypertensive heart disease with heart failure: Secondary | ICD-10-CM | POA: Diagnosis not present

## 2019-06-15 DIAGNOSIS — J449 Chronic obstructive pulmonary disease, unspecified: Secondary | ICD-10-CM | POA: Diagnosis not present

## 2019-06-15 DIAGNOSIS — C049 Malignant neoplasm of floor of mouth, unspecified: Secondary | ICD-10-CM | POA: Diagnosis not present

## 2019-06-15 DIAGNOSIS — K219 Gastro-esophageal reflux disease without esophagitis: Secondary | ICD-10-CM | POA: Diagnosis not present

## 2019-06-19 DIAGNOSIS — J449 Chronic obstructive pulmonary disease, unspecified: Secondary | ICD-10-CM | POA: Diagnosis not present

## 2019-06-19 DIAGNOSIS — I509 Heart failure, unspecified: Secondary | ICD-10-CM | POA: Diagnosis not present

## 2019-06-19 DIAGNOSIS — I2510 Atherosclerotic heart disease of native coronary artery without angina pectoris: Secondary | ICD-10-CM | POA: Diagnosis not present

## 2019-06-19 DIAGNOSIS — K219 Gastro-esophageal reflux disease without esophagitis: Secondary | ICD-10-CM | POA: Diagnosis not present

## 2019-06-19 DIAGNOSIS — I110 Hypertensive heart disease with heart failure: Secondary | ICD-10-CM | POA: Diagnosis not present

## 2019-06-19 DIAGNOSIS — C049 Malignant neoplasm of floor of mouth, unspecified: Secondary | ICD-10-CM | POA: Diagnosis not present

## 2019-06-27 DIAGNOSIS — K219 Gastro-esophageal reflux disease without esophagitis: Secondary | ICD-10-CM | POA: Diagnosis not present

## 2019-06-27 DIAGNOSIS — I509 Heart failure, unspecified: Secondary | ICD-10-CM | POA: Diagnosis not present

## 2019-06-27 DIAGNOSIS — I110 Hypertensive heart disease with heart failure: Secondary | ICD-10-CM | POA: Diagnosis not present

## 2019-06-27 DIAGNOSIS — I2510 Atherosclerotic heart disease of native coronary artery without angina pectoris: Secondary | ICD-10-CM | POA: Diagnosis not present

## 2019-06-27 DIAGNOSIS — C049 Malignant neoplasm of floor of mouth, unspecified: Secondary | ICD-10-CM | POA: Diagnosis not present

## 2019-06-27 DIAGNOSIS — J449 Chronic obstructive pulmonary disease, unspecified: Secondary | ICD-10-CM | POA: Diagnosis not present

## 2019-07-06 DIAGNOSIS — I2510 Atherosclerotic heart disease of native coronary artery without angina pectoris: Secondary | ICD-10-CM | POA: Diagnosis not present

## 2019-07-06 DIAGNOSIS — E46 Unspecified protein-calorie malnutrition: Secondary | ICD-10-CM | POA: Diagnosis not present

## 2019-07-06 DIAGNOSIS — C049 Malignant neoplasm of floor of mouth, unspecified: Secondary | ICD-10-CM | POA: Diagnosis not present

## 2019-07-06 DIAGNOSIS — J449 Chronic obstructive pulmonary disease, unspecified: Secondary | ICD-10-CM | POA: Diagnosis not present

## 2019-07-06 DIAGNOSIS — Z9981 Dependence on supplemental oxygen: Secondary | ICD-10-CM | POA: Diagnosis not present

## 2019-07-06 DIAGNOSIS — R296 Repeated falls: Secondary | ICD-10-CM | POA: Diagnosis not present

## 2019-07-06 DIAGNOSIS — K219 Gastro-esophageal reflux disease without esophagitis: Secondary | ICD-10-CM | POA: Diagnosis not present

## 2019-07-06 DIAGNOSIS — I110 Hypertensive heart disease with heart failure: Secondary | ICD-10-CM | POA: Diagnosis not present

## 2019-07-06 DIAGNOSIS — L0201 Cutaneous abscess of face: Secondary | ICD-10-CM | POA: Diagnosis not present

## 2019-07-06 DIAGNOSIS — I509 Heart failure, unspecified: Secondary | ICD-10-CM | POA: Diagnosis not present

## 2019-07-06 DIAGNOSIS — R1310 Dysphagia, unspecified: Secondary | ICD-10-CM | POA: Diagnosis not present

## 2019-07-07 DIAGNOSIS — I110 Hypertensive heart disease with heart failure: Secondary | ICD-10-CM | POA: Diagnosis not present

## 2019-07-07 DIAGNOSIS — C049 Malignant neoplasm of floor of mouth, unspecified: Secondary | ICD-10-CM | POA: Diagnosis not present

## 2019-07-07 DIAGNOSIS — K219 Gastro-esophageal reflux disease without esophagitis: Secondary | ICD-10-CM | POA: Diagnosis not present

## 2019-07-07 DIAGNOSIS — I2510 Atherosclerotic heart disease of native coronary artery without angina pectoris: Secondary | ICD-10-CM | POA: Diagnosis not present

## 2019-07-07 DIAGNOSIS — J449 Chronic obstructive pulmonary disease, unspecified: Secondary | ICD-10-CM | POA: Diagnosis not present

## 2019-07-07 DIAGNOSIS — I509 Heart failure, unspecified: Secondary | ICD-10-CM | POA: Diagnosis not present

## 2019-07-10 DIAGNOSIS — Z23 Encounter for immunization: Secondary | ICD-10-CM | POA: Diagnosis not present

## 2019-07-11 DIAGNOSIS — C049 Malignant neoplasm of floor of mouth, unspecified: Secondary | ICD-10-CM | POA: Diagnosis not present

## 2019-07-11 DIAGNOSIS — J449 Chronic obstructive pulmonary disease, unspecified: Secondary | ICD-10-CM | POA: Diagnosis not present

## 2019-07-11 DIAGNOSIS — I2510 Atherosclerotic heart disease of native coronary artery without angina pectoris: Secondary | ICD-10-CM | POA: Diagnosis not present

## 2019-07-11 DIAGNOSIS — K219 Gastro-esophageal reflux disease without esophagitis: Secondary | ICD-10-CM | POA: Diagnosis not present

## 2019-07-11 DIAGNOSIS — I509 Heart failure, unspecified: Secondary | ICD-10-CM | POA: Diagnosis not present

## 2019-07-11 DIAGNOSIS — I110 Hypertensive heart disease with heart failure: Secondary | ICD-10-CM | POA: Diagnosis not present

## 2019-07-12 DIAGNOSIS — C049 Malignant neoplasm of floor of mouth, unspecified: Secondary | ICD-10-CM | POA: Diagnosis not present

## 2019-07-12 DIAGNOSIS — J449 Chronic obstructive pulmonary disease, unspecified: Secondary | ICD-10-CM | POA: Diagnosis not present

## 2019-07-12 DIAGNOSIS — I509 Heart failure, unspecified: Secondary | ICD-10-CM | POA: Diagnosis not present

## 2019-07-12 DIAGNOSIS — K219 Gastro-esophageal reflux disease without esophagitis: Secondary | ICD-10-CM | POA: Diagnosis not present

## 2019-07-12 DIAGNOSIS — I110 Hypertensive heart disease with heart failure: Secondary | ICD-10-CM | POA: Diagnosis not present

## 2019-07-12 DIAGNOSIS — I2510 Atherosclerotic heart disease of native coronary artery without angina pectoris: Secondary | ICD-10-CM | POA: Diagnosis not present

## 2019-07-17 DIAGNOSIS — I2510 Atherosclerotic heart disease of native coronary artery without angina pectoris: Secondary | ICD-10-CM | POA: Diagnosis not present

## 2019-07-17 DIAGNOSIS — J449 Chronic obstructive pulmonary disease, unspecified: Secondary | ICD-10-CM | POA: Diagnosis not present

## 2019-07-17 DIAGNOSIS — K219 Gastro-esophageal reflux disease without esophagitis: Secondary | ICD-10-CM | POA: Diagnosis not present

## 2019-07-17 DIAGNOSIS — I509 Heart failure, unspecified: Secondary | ICD-10-CM | POA: Diagnosis not present

## 2019-07-17 DIAGNOSIS — I110 Hypertensive heart disease with heart failure: Secondary | ICD-10-CM | POA: Diagnosis not present

## 2019-07-17 DIAGNOSIS — C049 Malignant neoplasm of floor of mouth, unspecified: Secondary | ICD-10-CM | POA: Diagnosis not present

## 2019-07-21 DIAGNOSIS — I2510 Atherosclerotic heart disease of native coronary artery without angina pectoris: Secondary | ICD-10-CM | POA: Diagnosis not present

## 2019-07-21 DIAGNOSIS — C049 Malignant neoplasm of floor of mouth, unspecified: Secondary | ICD-10-CM | POA: Diagnosis not present

## 2019-07-21 DIAGNOSIS — I509 Heart failure, unspecified: Secondary | ICD-10-CM | POA: Diagnosis not present

## 2019-07-21 DIAGNOSIS — K219 Gastro-esophageal reflux disease without esophagitis: Secondary | ICD-10-CM | POA: Diagnosis not present

## 2019-07-21 DIAGNOSIS — I110 Hypertensive heart disease with heart failure: Secondary | ICD-10-CM | POA: Diagnosis not present

## 2019-07-21 DIAGNOSIS — J449 Chronic obstructive pulmonary disease, unspecified: Secondary | ICD-10-CM | POA: Diagnosis not present

## 2019-07-24 DIAGNOSIS — I509 Heart failure, unspecified: Secondary | ICD-10-CM | POA: Diagnosis not present

## 2019-07-24 DIAGNOSIS — I110 Hypertensive heart disease with heart failure: Secondary | ICD-10-CM | POA: Diagnosis not present

## 2019-07-24 DIAGNOSIS — C049 Malignant neoplasm of floor of mouth, unspecified: Secondary | ICD-10-CM | POA: Diagnosis not present

## 2019-07-24 DIAGNOSIS — K219 Gastro-esophageal reflux disease without esophagitis: Secondary | ICD-10-CM | POA: Diagnosis not present

## 2019-07-24 DIAGNOSIS — J449 Chronic obstructive pulmonary disease, unspecified: Secondary | ICD-10-CM | POA: Diagnosis not present

## 2019-07-24 DIAGNOSIS — I2510 Atherosclerotic heart disease of native coronary artery without angina pectoris: Secondary | ICD-10-CM | POA: Diagnosis not present

## 2019-07-31 DIAGNOSIS — I2510 Atherosclerotic heart disease of native coronary artery without angina pectoris: Secondary | ICD-10-CM | POA: Diagnosis not present

## 2019-07-31 DIAGNOSIS — J449 Chronic obstructive pulmonary disease, unspecified: Secondary | ICD-10-CM | POA: Diagnosis not present

## 2019-07-31 DIAGNOSIS — I110 Hypertensive heart disease with heart failure: Secondary | ICD-10-CM | POA: Diagnosis not present

## 2019-07-31 DIAGNOSIS — K219 Gastro-esophageal reflux disease without esophagitis: Secondary | ICD-10-CM | POA: Diagnosis not present

## 2019-07-31 DIAGNOSIS — C049 Malignant neoplasm of floor of mouth, unspecified: Secondary | ICD-10-CM | POA: Diagnosis not present

## 2019-07-31 DIAGNOSIS — I509 Heart failure, unspecified: Secondary | ICD-10-CM | POA: Diagnosis not present

## 2019-08-04 DIAGNOSIS — K219 Gastro-esophageal reflux disease without esophagitis: Secondary | ICD-10-CM | POA: Diagnosis not present

## 2019-08-04 DIAGNOSIS — I2510 Atherosclerotic heart disease of native coronary artery without angina pectoris: Secondary | ICD-10-CM | POA: Diagnosis not present

## 2019-08-04 DIAGNOSIS — C049 Malignant neoplasm of floor of mouth, unspecified: Secondary | ICD-10-CM | POA: Diagnosis not present

## 2019-08-04 DIAGNOSIS — J449 Chronic obstructive pulmonary disease, unspecified: Secondary | ICD-10-CM | POA: Diagnosis not present

## 2019-08-04 DIAGNOSIS — I110 Hypertensive heart disease with heart failure: Secondary | ICD-10-CM | POA: Diagnosis not present

## 2019-08-04 DIAGNOSIS — I509 Heart failure, unspecified: Secondary | ICD-10-CM | POA: Diagnosis not present

## 2019-08-06 DIAGNOSIS — Z9981 Dependence on supplemental oxygen: Secondary | ICD-10-CM | POA: Diagnosis not present

## 2019-08-06 DIAGNOSIS — R296 Repeated falls: Secondary | ICD-10-CM | POA: Diagnosis not present

## 2019-08-06 DIAGNOSIS — E46 Unspecified protein-calorie malnutrition: Secondary | ICD-10-CM | POA: Diagnosis not present

## 2019-08-06 DIAGNOSIS — I2510 Atherosclerotic heart disease of native coronary artery without angina pectoris: Secondary | ICD-10-CM | POA: Diagnosis not present

## 2019-08-06 DIAGNOSIS — R1310 Dysphagia, unspecified: Secondary | ICD-10-CM | POA: Diagnosis not present

## 2019-08-06 DIAGNOSIS — K219 Gastro-esophageal reflux disease without esophagitis: Secondary | ICD-10-CM | POA: Diagnosis not present

## 2019-08-06 DIAGNOSIS — I509 Heart failure, unspecified: Secondary | ICD-10-CM | POA: Diagnosis not present

## 2019-08-06 DIAGNOSIS — F17210 Nicotine dependence, cigarettes, uncomplicated: Secondary | ICD-10-CM | POA: Diagnosis not present

## 2019-08-06 DIAGNOSIS — J449 Chronic obstructive pulmonary disease, unspecified: Secondary | ICD-10-CM | POA: Diagnosis not present

## 2019-08-06 DIAGNOSIS — C049 Malignant neoplasm of floor of mouth, unspecified: Secondary | ICD-10-CM | POA: Diagnosis not present

## 2019-08-06 DIAGNOSIS — L0201 Cutaneous abscess of face: Secondary | ICD-10-CM | POA: Diagnosis not present

## 2019-08-06 DIAGNOSIS — I110 Hypertensive heart disease with heart failure: Secondary | ICD-10-CM | POA: Diagnosis not present

## 2019-08-06 DIAGNOSIS — W19XXXD Unspecified fall, subsequent encounter: Secondary | ICD-10-CM | POA: Diagnosis not present

## 2019-08-08 DIAGNOSIS — K219 Gastro-esophageal reflux disease without esophagitis: Secondary | ICD-10-CM | POA: Diagnosis not present

## 2019-08-08 DIAGNOSIS — I509 Heart failure, unspecified: Secondary | ICD-10-CM | POA: Diagnosis not present

## 2019-08-08 DIAGNOSIS — I110 Hypertensive heart disease with heart failure: Secondary | ICD-10-CM | POA: Diagnosis not present

## 2019-08-08 DIAGNOSIS — J449 Chronic obstructive pulmonary disease, unspecified: Secondary | ICD-10-CM | POA: Diagnosis not present

## 2019-08-08 DIAGNOSIS — C049 Malignant neoplasm of floor of mouth, unspecified: Secondary | ICD-10-CM | POA: Diagnosis not present

## 2019-08-08 DIAGNOSIS — I2510 Atherosclerotic heart disease of native coronary artery without angina pectoris: Secondary | ICD-10-CM | POA: Diagnosis not present

## 2019-08-09 DIAGNOSIS — I509 Heart failure, unspecified: Secondary | ICD-10-CM | POA: Diagnosis not present

## 2019-08-09 DIAGNOSIS — K219 Gastro-esophageal reflux disease without esophagitis: Secondary | ICD-10-CM | POA: Diagnosis not present

## 2019-08-09 DIAGNOSIS — C049 Malignant neoplasm of floor of mouth, unspecified: Secondary | ICD-10-CM | POA: Diagnosis not present

## 2019-08-09 DIAGNOSIS — J449 Chronic obstructive pulmonary disease, unspecified: Secondary | ICD-10-CM | POA: Diagnosis not present

## 2019-08-09 DIAGNOSIS — I2510 Atherosclerotic heart disease of native coronary artery without angina pectoris: Secondary | ICD-10-CM | POA: Diagnosis not present

## 2019-08-09 DIAGNOSIS — I110 Hypertensive heart disease with heart failure: Secondary | ICD-10-CM | POA: Diagnosis not present

## 2019-08-11 DIAGNOSIS — K219 Gastro-esophageal reflux disease without esophagitis: Secondary | ICD-10-CM | POA: Diagnosis not present

## 2019-08-11 DIAGNOSIS — C049 Malignant neoplasm of floor of mouth, unspecified: Secondary | ICD-10-CM | POA: Diagnosis not present

## 2019-08-11 DIAGNOSIS — I2510 Atherosclerotic heart disease of native coronary artery without angina pectoris: Secondary | ICD-10-CM | POA: Diagnosis not present

## 2019-08-11 DIAGNOSIS — I509 Heart failure, unspecified: Secondary | ICD-10-CM | POA: Diagnosis not present

## 2019-08-11 DIAGNOSIS — I110 Hypertensive heart disease with heart failure: Secondary | ICD-10-CM | POA: Diagnosis not present

## 2019-08-11 DIAGNOSIS — J449 Chronic obstructive pulmonary disease, unspecified: Secondary | ICD-10-CM | POA: Diagnosis not present

## 2019-08-15 DIAGNOSIS — K219 Gastro-esophageal reflux disease without esophagitis: Secondary | ICD-10-CM | POA: Diagnosis not present

## 2019-08-15 DIAGNOSIS — I2510 Atherosclerotic heart disease of native coronary artery without angina pectoris: Secondary | ICD-10-CM | POA: Diagnosis not present

## 2019-08-15 DIAGNOSIS — I110 Hypertensive heart disease with heart failure: Secondary | ICD-10-CM | POA: Diagnosis not present

## 2019-08-15 DIAGNOSIS — I509 Heart failure, unspecified: Secondary | ICD-10-CM | POA: Diagnosis not present

## 2019-08-15 DIAGNOSIS — J449 Chronic obstructive pulmonary disease, unspecified: Secondary | ICD-10-CM | POA: Diagnosis not present

## 2019-08-15 DIAGNOSIS — C049 Malignant neoplasm of floor of mouth, unspecified: Secondary | ICD-10-CM | POA: Diagnosis not present

## 2019-08-21 DIAGNOSIS — K219 Gastro-esophageal reflux disease without esophagitis: Secondary | ICD-10-CM | POA: Diagnosis not present

## 2019-08-21 DIAGNOSIS — C049 Malignant neoplasm of floor of mouth, unspecified: Secondary | ICD-10-CM | POA: Diagnosis not present

## 2019-08-21 DIAGNOSIS — I2510 Atherosclerotic heart disease of native coronary artery without angina pectoris: Secondary | ICD-10-CM | POA: Diagnosis not present

## 2019-08-21 DIAGNOSIS — J449 Chronic obstructive pulmonary disease, unspecified: Secondary | ICD-10-CM | POA: Diagnosis not present

## 2019-08-21 DIAGNOSIS — I110 Hypertensive heart disease with heart failure: Secondary | ICD-10-CM | POA: Diagnosis not present

## 2019-08-21 DIAGNOSIS — I509 Heart failure, unspecified: Secondary | ICD-10-CM | POA: Diagnosis not present

## 2019-08-25 DIAGNOSIS — C049 Malignant neoplasm of floor of mouth, unspecified: Secondary | ICD-10-CM | POA: Diagnosis not present

## 2019-08-25 DIAGNOSIS — I509 Heart failure, unspecified: Secondary | ICD-10-CM | POA: Diagnosis not present

## 2019-08-25 DIAGNOSIS — I2510 Atherosclerotic heart disease of native coronary artery without angina pectoris: Secondary | ICD-10-CM | POA: Diagnosis not present

## 2019-08-25 DIAGNOSIS — J449 Chronic obstructive pulmonary disease, unspecified: Secondary | ICD-10-CM | POA: Diagnosis not present

## 2019-08-25 DIAGNOSIS — I110 Hypertensive heart disease with heart failure: Secondary | ICD-10-CM | POA: Diagnosis not present

## 2019-08-25 DIAGNOSIS — K219 Gastro-esophageal reflux disease without esophagitis: Secondary | ICD-10-CM | POA: Diagnosis not present

## 2019-08-27 DIAGNOSIS — I110 Hypertensive heart disease with heart failure: Secondary | ICD-10-CM | POA: Diagnosis not present

## 2019-08-27 DIAGNOSIS — J449 Chronic obstructive pulmonary disease, unspecified: Secondary | ICD-10-CM | POA: Diagnosis not present

## 2019-08-27 DIAGNOSIS — I2510 Atherosclerotic heart disease of native coronary artery without angina pectoris: Secondary | ICD-10-CM | POA: Diagnosis not present

## 2019-08-27 DIAGNOSIS — I509 Heart failure, unspecified: Secondary | ICD-10-CM | POA: Diagnosis not present

## 2019-08-27 DIAGNOSIS — K219 Gastro-esophageal reflux disease without esophagitis: Secondary | ICD-10-CM | POA: Diagnosis not present

## 2019-08-27 DIAGNOSIS — C049 Malignant neoplasm of floor of mouth, unspecified: Secondary | ICD-10-CM | POA: Diagnosis not present

## 2019-08-28 DIAGNOSIS — I509 Heart failure, unspecified: Secondary | ICD-10-CM | POA: Diagnosis not present

## 2019-08-28 DIAGNOSIS — I110 Hypertensive heart disease with heart failure: Secondary | ICD-10-CM | POA: Diagnosis not present

## 2019-08-28 DIAGNOSIS — J449 Chronic obstructive pulmonary disease, unspecified: Secondary | ICD-10-CM | POA: Diagnosis not present

## 2019-08-28 DIAGNOSIS — C049 Malignant neoplasm of floor of mouth, unspecified: Secondary | ICD-10-CM | POA: Diagnosis not present

## 2019-08-28 DIAGNOSIS — K219 Gastro-esophageal reflux disease without esophagitis: Secondary | ICD-10-CM | POA: Diagnosis not present

## 2019-08-28 DIAGNOSIS — I2510 Atherosclerotic heart disease of native coronary artery without angina pectoris: Secondary | ICD-10-CM | POA: Diagnosis not present

## 2019-08-29 DIAGNOSIS — I509 Heart failure, unspecified: Secondary | ICD-10-CM | POA: Diagnosis not present

## 2019-08-29 DIAGNOSIS — I110 Hypertensive heart disease with heart failure: Secondary | ICD-10-CM | POA: Diagnosis not present

## 2019-08-29 DIAGNOSIS — J449 Chronic obstructive pulmonary disease, unspecified: Secondary | ICD-10-CM | POA: Diagnosis not present

## 2019-08-29 DIAGNOSIS — I2510 Atherosclerotic heart disease of native coronary artery without angina pectoris: Secondary | ICD-10-CM | POA: Diagnosis not present

## 2019-08-29 DIAGNOSIS — K219 Gastro-esophageal reflux disease without esophagitis: Secondary | ICD-10-CM | POA: Diagnosis not present

## 2019-08-29 DIAGNOSIS — C049 Malignant neoplasm of floor of mouth, unspecified: Secondary | ICD-10-CM | POA: Diagnosis not present

## 2019-09-05 DEATH — deceased
